# Patient Record
Sex: Female | Born: 1970 | Race: Black or African American | Hispanic: No | Marital: Married | Smoking: Never smoker
Health system: Southern US, Community
[De-identification: ages and names within clinical notes are randomized; demographics above are authoritative.]

## PROBLEM LIST (undated history)

## (undated) DIAGNOSIS — M719 Bursopathy, unspecified: Secondary | ICD-10-CM

## (undated) DIAGNOSIS — I872 Venous insufficiency (chronic) (peripheral): Secondary | ICD-10-CM

## (undated) DIAGNOSIS — Z9889 Other specified postprocedural states: Secondary | ICD-10-CM

## (undated) DIAGNOSIS — D219 Benign neoplasm of connective and other soft tissue, unspecified: Secondary | ICD-10-CM

## (undated) DIAGNOSIS — K219 Gastro-esophageal reflux disease without esophagitis: Secondary | ICD-10-CM

## (undated) DIAGNOSIS — E559 Vitamin D deficiency, unspecified: Secondary | ICD-10-CM

## (undated) DIAGNOSIS — I10 Essential (primary) hypertension: Secondary | ICD-10-CM

## (undated) DIAGNOSIS — R112 Nausea with vomiting, unspecified: Secondary | ICD-10-CM

## (undated) DIAGNOSIS — F419 Anxiety disorder, unspecified: Secondary | ICD-10-CM

## (undated) DIAGNOSIS — R002 Palpitations: Secondary | ICD-10-CM

## (undated) HISTORY — DX: Anxiety disorder, unspecified: F41.9

## (undated) HISTORY — DX: Benign neoplasm of connective and other soft tissue, unspecified: D21.9

## (undated) HISTORY — PX: ABDOMINAL HYSTERECTOMY: SHX81

## (undated) HISTORY — DX: Venous insufficiency (chronic) (peripheral): I87.2

## (undated) HISTORY — PX: DILATION AND CURETTAGE OF UTERUS: SHX78

## (undated) HISTORY — DX: Gastro-esophageal reflux disease without esophagitis: K21.9

## (undated) HISTORY — DX: Vitamin D deficiency, unspecified: E55.9

## (undated) HISTORY — DX: Palpitations: R00.2

---

## 1998-02-13 ENCOUNTER — Encounter: Admission: RE | Admit: 1998-02-13 | Discharge: 1998-05-14 | Payer: Self-pay

## 2001-03-05 ENCOUNTER — Other Ambulatory Visit: Admission: RE | Admit: 2001-03-05 | Discharge: 2001-03-05 | Payer: Self-pay | Admitting: Obstetrics and Gynecology

## 2001-10-08 ENCOUNTER — Inpatient Hospital Stay (HOSPITAL_COMMUNITY): Admission: RE | Admit: 2001-10-08 | Discharge: 2001-10-12 | Payer: Self-pay | Admitting: Obstetrics and Gynecology

## 2002-03-22 ENCOUNTER — Encounter: Payer: Self-pay | Admitting: *Deleted

## 2002-03-22 ENCOUNTER — Emergency Department (HOSPITAL_COMMUNITY): Admission: EM | Admit: 2002-03-22 | Discharge: 2002-03-22 | Payer: Self-pay | Admitting: Internal Medicine

## 2002-09-30 ENCOUNTER — Ambulatory Visit (HOSPITAL_COMMUNITY): Admission: RE | Admit: 2002-09-30 | Discharge: 2002-09-30 | Payer: Self-pay | Admitting: Obstetrics and Gynecology

## 2002-09-30 ENCOUNTER — Encounter: Payer: Self-pay | Admitting: Obstetrics and Gynecology

## 2003-07-30 ENCOUNTER — Ambulatory Visit (HOSPITAL_COMMUNITY): Admission: RE | Admit: 2003-07-30 | Discharge: 2003-07-30 | Payer: Self-pay | Admitting: Family Medicine

## 2005-02-17 ENCOUNTER — Ambulatory Visit: Payer: Self-pay | Admitting: Family Medicine

## 2005-03-07 ENCOUNTER — Ambulatory Visit: Payer: Self-pay | Admitting: Family Medicine

## 2005-05-11 ENCOUNTER — Ambulatory Visit: Payer: Self-pay | Admitting: Family Medicine

## 2005-10-12 ENCOUNTER — Ambulatory Visit: Payer: Self-pay | Admitting: Family Medicine

## 2005-11-25 ENCOUNTER — Ambulatory Visit: Payer: Self-pay | Admitting: Internal Medicine

## 2005-11-30 ENCOUNTER — Ambulatory Visit (HOSPITAL_COMMUNITY): Admission: RE | Admit: 2005-11-30 | Discharge: 2005-11-30 | Payer: Self-pay | Admitting: Internal Medicine

## 2005-11-30 ENCOUNTER — Ambulatory Visit: Payer: Self-pay | Admitting: Internal Medicine

## 2005-12-28 ENCOUNTER — Ambulatory Visit: Payer: Self-pay | Admitting: Family Medicine

## 2006-03-02 ENCOUNTER — Ambulatory Visit: Payer: Self-pay | Admitting: Internal Medicine

## 2006-03-14 ENCOUNTER — Other Ambulatory Visit: Admission: RE | Admit: 2006-03-14 | Discharge: 2006-03-14 | Payer: Self-pay | Admitting: Family Medicine

## 2006-03-14 ENCOUNTER — Ambulatory Visit: Payer: Self-pay | Admitting: Family Medicine

## 2006-03-14 ENCOUNTER — Encounter: Payer: Self-pay | Admitting: Family Medicine

## 2006-03-17 ENCOUNTER — Ambulatory Visit (HOSPITAL_COMMUNITY): Admission: RE | Admit: 2006-03-17 | Discharge: 2006-03-17 | Payer: Self-pay | Admitting: Family Medicine

## 2006-05-26 ENCOUNTER — Ambulatory Visit (HOSPITAL_COMMUNITY): Admission: RE | Admit: 2006-05-26 | Discharge: 2006-05-26 | Payer: Self-pay | Admitting: Obstetrics and Gynecology

## 2006-05-26 ENCOUNTER — Encounter (INDEPENDENT_AMBULATORY_CARE_PROVIDER_SITE_OTHER): Payer: Self-pay | Admitting: Specialist

## 2006-06-01 ENCOUNTER — Ambulatory Visit: Payer: Self-pay | Admitting: Internal Medicine

## 2006-10-23 ENCOUNTER — Ambulatory Visit: Payer: Self-pay | Admitting: Family Medicine

## 2006-11-07 ENCOUNTER — Ambulatory Visit (HOSPITAL_COMMUNITY): Admission: RE | Admit: 2006-11-07 | Discharge: 2006-11-07 | Payer: Self-pay | Admitting: Family Medicine

## 2006-11-07 ENCOUNTER — Ambulatory Visit: Payer: Self-pay | Admitting: Family Medicine

## 2007-01-17 ENCOUNTER — Ambulatory Visit: Payer: Self-pay | Admitting: Family Medicine

## 2007-01-29 ENCOUNTER — Ambulatory Visit: Payer: Self-pay | Admitting: Family Medicine

## 2007-01-30 ENCOUNTER — Encounter: Payer: Self-pay | Admitting: Family Medicine

## 2007-03-19 ENCOUNTER — Other Ambulatory Visit: Admission: RE | Admit: 2007-03-19 | Discharge: 2007-03-19 | Payer: Self-pay | Admitting: Family Medicine

## 2007-03-19 ENCOUNTER — Ambulatory Visit: Payer: Self-pay | Admitting: Family Medicine

## 2007-03-19 LAB — CONVERTED CEMR LAB: Pap Smear: NORMAL

## 2007-03-20 ENCOUNTER — Encounter: Payer: Self-pay | Admitting: Family Medicine

## 2007-03-20 LAB — CONVERTED CEMR LAB
Candida species: NEGATIVE
Chlamydia, DNA Probe: NEGATIVE
GC Probe Amp, Genital: NEGATIVE
Gardnerella vaginalis: NEGATIVE
Trichomonal Vaginitis: NEGATIVE

## 2007-05-23 ENCOUNTER — Ambulatory Visit: Payer: Self-pay | Admitting: Family Medicine

## 2007-05-23 LAB — CONVERTED CEMR LAB
BUN: 13 mg/dL (ref 6–23)
Basophils Absolute: 0 10*3/uL (ref 0.0–0.1)
Basophils Relative: 0 % (ref 0–1)
CO2: 22 meq/L (ref 19–32)
Calcium: 8.1 mg/dL — ABNORMAL LOW (ref 8.4–10.5)
Chloride: 104 meq/L (ref 96–112)
Cholesterol: 157 mg/dL (ref 0–200)
Creatinine, Ser: 0.65 mg/dL (ref 0.40–1.20)
Eosinophils Absolute: 0.1 10*3/uL (ref 0.0–0.7)
Eosinophils Relative: 2 % (ref 0–5)
Glucose, Bld: 74 mg/dL (ref 70–99)
HCT: 42.4 % (ref 36.0–46.0)
HDL: 66 mg/dL (ref >39)
Hemoglobin: 13.7 g/dL (ref 12.0–15.0)
LDL Cholesterol: 75 mg/dL (ref 0–99)
Lymphocytes Relative: 32 % (ref 12–46)
Lymphs Abs: 2 10*3/uL (ref 0.7–3.3)
MCHC: 32.3 g/dL (ref 30.0–36.0)
MCV: 92 fL (ref 78.0–100.0)
Monocytes Absolute: 0.3 10*3/uL (ref 0.2–0.7)
Monocytes Relative: 5 % (ref 3–11)
Neutro Abs: 3.9 10*3/uL (ref 1.7–7.7)
Neutrophils Relative %: 61 % (ref 43–77)
Platelets: 332 10*3/uL (ref 150–400)
Potassium: 4.1 meq/L (ref 3.5–5.3)
RBC: 4.61 M/uL (ref 3.87–5.11)
RDW: 13.3 % (ref 11.5–14.0)
Sodium: 136 meq/L (ref 135–145)
TSH: 0.351 microintl units/mL (ref 0.350–5.50)
Total CHOL/HDL Ratio: 2.4
Triglycerides: 82 mg/dL (ref <150)
VLDL: 16 mg/dL (ref 0–40)
WBC: 6.3 10*3/uL (ref 4.0–10.5)

## 2007-07-13 ENCOUNTER — Encounter: Payer: Self-pay | Admitting: Family Medicine

## 2007-07-13 DIAGNOSIS — K219 Gastro-esophageal reflux disease without esophagitis: Secondary | ICD-10-CM

## 2007-07-13 DIAGNOSIS — J309 Allergic rhinitis, unspecified: Secondary | ICD-10-CM | POA: Insufficient documentation

## 2007-07-13 DIAGNOSIS — E669 Obesity, unspecified: Secondary | ICD-10-CM | POA: Insufficient documentation

## 2007-07-13 DIAGNOSIS — F329 Major depressive disorder, single episode, unspecified: Secondary | ICD-10-CM | POA: Insufficient documentation

## 2007-07-13 DIAGNOSIS — F411 Generalized anxiety disorder: Secondary | ICD-10-CM

## 2007-07-26 ENCOUNTER — Ambulatory Visit: Payer: Self-pay | Admitting: Family Medicine

## 2007-07-27 ENCOUNTER — Encounter: Payer: Self-pay | Admitting: Family Medicine

## 2007-07-27 LAB — CONVERTED CEMR LAB
Candida species: POSITIVE — AB
Chlamydia, DNA Probe: NEGATIVE
GC Probe Amp, Genital: NEGATIVE
Gardnerella vaginalis: NEGATIVE
Trichomonal Vaginitis: NEGATIVE

## 2007-08-30 ENCOUNTER — Ambulatory Visit (HOSPITAL_COMMUNITY): Payer: Self-pay | Admitting: Psychiatry

## 2007-09-03 ENCOUNTER — Ambulatory Visit: Payer: Self-pay | Admitting: Family Medicine

## 2007-09-20 ENCOUNTER — Encounter: Payer: Self-pay | Admitting: Family Medicine

## 2008-01-09 ENCOUNTER — Ambulatory Visit: Payer: Self-pay | Admitting: Family Medicine

## 2008-01-27 ENCOUNTER — Telehealth: Payer: Self-pay | Admitting: Family Medicine

## 2008-01-29 ENCOUNTER — Ambulatory Visit: Payer: Self-pay | Admitting: Family Medicine

## 2008-03-10 ENCOUNTER — Ambulatory Visit: Payer: Self-pay | Admitting: Orthopedic Surgery

## 2008-03-10 DIAGNOSIS — M76829 Posterior tibial tendinitis, unspecified leg: Secondary | ICD-10-CM | POA: Insufficient documentation

## 2008-03-10 DIAGNOSIS — M7150 Other bursitis, not elsewhere classified, unspecified site: Secondary | ICD-10-CM

## 2008-03-18 ENCOUNTER — Other Ambulatory Visit: Admission: RE | Admit: 2008-03-18 | Discharge: 2008-03-18 | Payer: Self-pay | Admitting: Family Medicine

## 2008-03-18 ENCOUNTER — Encounter: Payer: Self-pay | Admitting: Family Medicine

## 2008-03-18 ENCOUNTER — Ambulatory Visit: Payer: Self-pay | Admitting: Family Medicine

## 2008-03-18 LAB — CONVERTED CEMR LAB: Pap Smear: NORMAL

## 2008-03-24 ENCOUNTER — Encounter: Payer: Self-pay | Admitting: Family Medicine

## 2008-03-24 ENCOUNTER — Ambulatory Visit: Payer: Self-pay | Admitting: Family Medicine

## 2008-03-24 DIAGNOSIS — N949 Unspecified condition associated with female genital organs and menstrual cycle: Secondary | ICD-10-CM

## 2008-03-26 ENCOUNTER — Encounter: Payer: Self-pay | Admitting: Family Medicine

## 2008-04-02 ENCOUNTER — Ambulatory Visit (HOSPITAL_COMMUNITY): Admission: RE | Admit: 2008-04-02 | Discharge: 2008-04-02 | Payer: Self-pay | Admitting: Family Medicine

## 2008-04-10 ENCOUNTER — Encounter: Payer: Self-pay | Admitting: Orthopedic Surgery

## 2008-07-24 ENCOUNTER — Telehealth: Payer: Self-pay | Admitting: Family Medicine

## 2008-08-21 ENCOUNTER — Telehealth (INDEPENDENT_AMBULATORY_CARE_PROVIDER_SITE_OTHER): Payer: Self-pay | Admitting: *Deleted

## 2008-10-08 ENCOUNTER — Ambulatory Visit: Payer: Self-pay | Admitting: Family Medicine

## 2008-10-08 DIAGNOSIS — M25559 Pain in unspecified hip: Secondary | ICD-10-CM | POA: Insufficient documentation

## 2008-10-28 ENCOUNTER — Ambulatory Visit (HOSPITAL_COMMUNITY): Admission: RE | Admit: 2008-10-28 | Discharge: 2008-10-28 | Payer: Self-pay | Admitting: Family Medicine

## 2008-10-28 ENCOUNTER — Encounter: Payer: Self-pay | Admitting: Family Medicine

## 2008-10-28 LAB — CONVERTED CEMR LAB
BUN: 14 mg/dL (ref 6–23)
CO2: 21 meq/L (ref 19–32)
Calcium: 8.9 mg/dL (ref 8.4–10.5)
Chloride: 105 meq/L (ref 96–112)
Cholesterol: 153 mg/dL (ref 0–200)
Creatinine, Ser: 0.75 mg/dL (ref 0.40–1.20)
Glucose, Bld: 77 mg/dL (ref 70–99)
HDL: 82 mg/dL (ref >39)
LDL Cholesterol: 51 mg/dL (ref 0–99)
Potassium: 4.1 meq/L (ref 3.5–5.3)
Sodium: 137 meq/L (ref 135–145)
TSH: 0.519 microintl units/mL (ref 0.350–4.50)
Total CHOL/HDL Ratio: 1.9
Triglycerides: 99 mg/dL (ref <150)
VLDL: 20 mg/dL (ref 0–40)

## 2008-11-05 ENCOUNTER — Encounter: Payer: Self-pay | Admitting: Family Medicine

## 2008-11-11 ENCOUNTER — Ambulatory Visit: Payer: Self-pay | Admitting: Family Medicine

## 2008-11-11 DIAGNOSIS — J209 Acute bronchitis, unspecified: Secondary | ICD-10-CM | POA: Insufficient documentation

## 2008-11-21 ENCOUNTER — Encounter: Payer: Self-pay | Admitting: Family Medicine

## 2008-11-27 ENCOUNTER — Encounter: Payer: Self-pay | Admitting: Family Medicine

## 2008-12-18 ENCOUNTER — Telehealth: Payer: Self-pay | Admitting: Family Medicine

## 2008-12-24 ENCOUNTER — Ambulatory Visit: Payer: Self-pay | Admitting: Family Medicine

## 2008-12-24 DIAGNOSIS — R002 Palpitations: Secondary | ICD-10-CM

## 2008-12-24 DIAGNOSIS — R5381 Other malaise: Secondary | ICD-10-CM

## 2008-12-24 DIAGNOSIS — R079 Chest pain, unspecified: Secondary | ICD-10-CM | POA: Insufficient documentation

## 2008-12-24 DIAGNOSIS — R5383 Other fatigue: Secondary | ICD-10-CM

## 2008-12-26 ENCOUNTER — Encounter: Payer: Self-pay | Admitting: Family Medicine

## 2009-01-11 HISTORY — PX: TRANSTHORACIC ECHOCARDIOGRAM: SHX275

## 2009-01-13 ENCOUNTER — Encounter: Payer: Self-pay | Admitting: Family Medicine

## 2009-01-14 ENCOUNTER — Encounter: Payer: Self-pay | Admitting: Family Medicine

## 2009-01-20 ENCOUNTER — Ambulatory Visit: Payer: Self-pay | Admitting: Family Medicine

## 2009-01-23 DIAGNOSIS — J019 Acute sinusitis, unspecified: Secondary | ICD-10-CM | POA: Insufficient documentation

## 2009-02-18 ENCOUNTER — Encounter: Payer: Self-pay | Admitting: Family Medicine

## 2009-03-04 IMAGING — US US PELVIS COMPLETE MODIFY
1 series · 14 of 25 positions shown · non-contrast
Comparison: 03/17/2006

CLINICAL DATA: Dysfunctional uterine bleeding.

TRANSABDOMINAL AND TRANSVAGINAL ULTRASOUND OF PELVIS
TECHNIQUE: Both transabdominal and transvaginal ultrasound
examinations of the pelvis were performed including evaluation of
the uterus, ovaries, adnexal regions, and pelvic cul-de-sac.

[Series 1: us pelvis complete modify · 0.24mm/px · 14 of 68 slices shown]
[im 1/68]
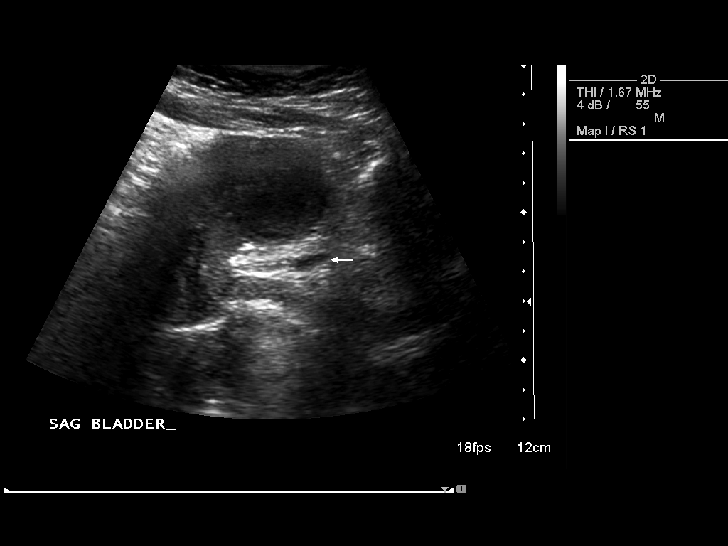
[im 6/68]
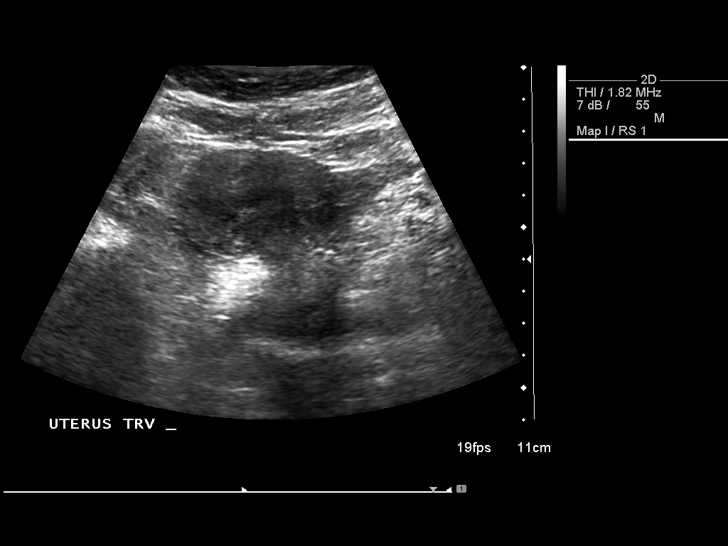
[im 12/68]
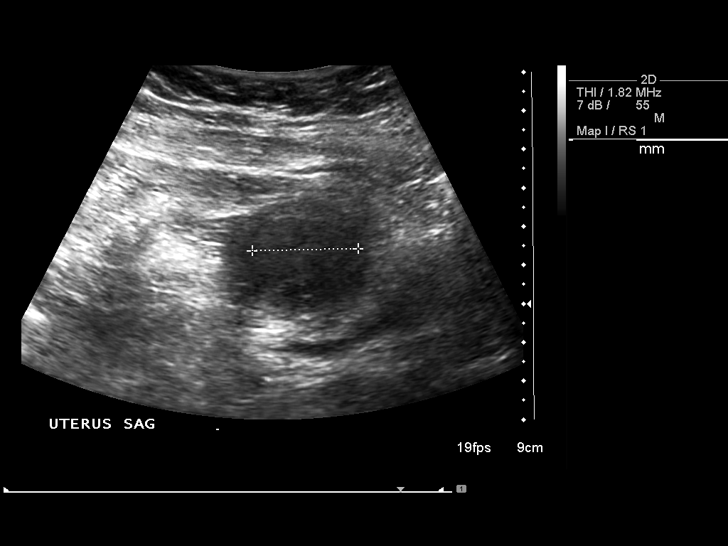
[im 17/68]
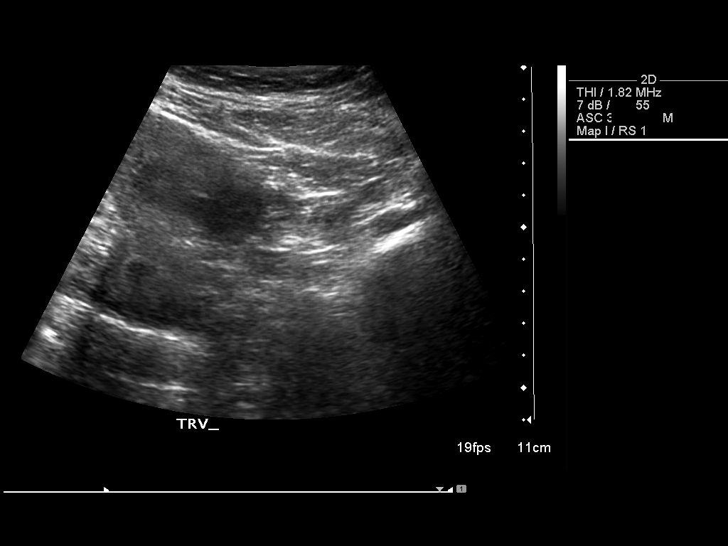
[im 23/68]
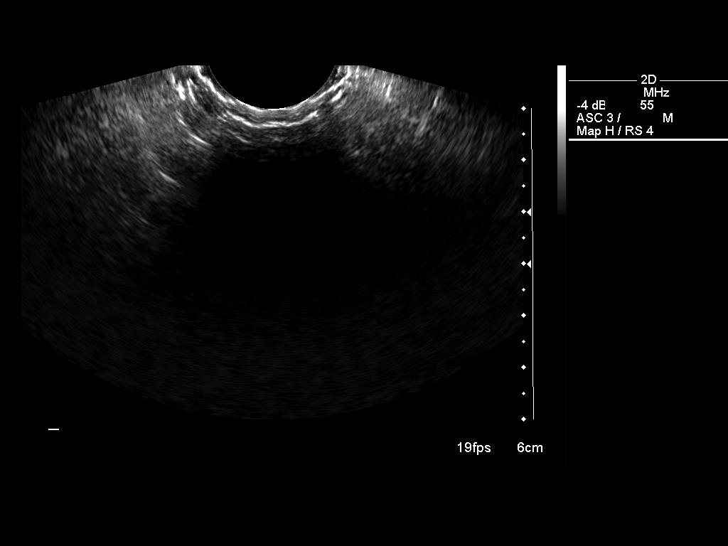
[im 26/68]
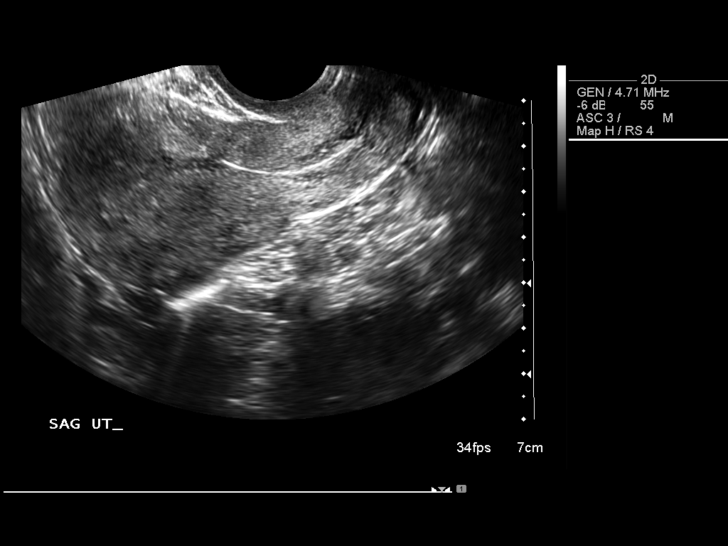
[im 31/68]
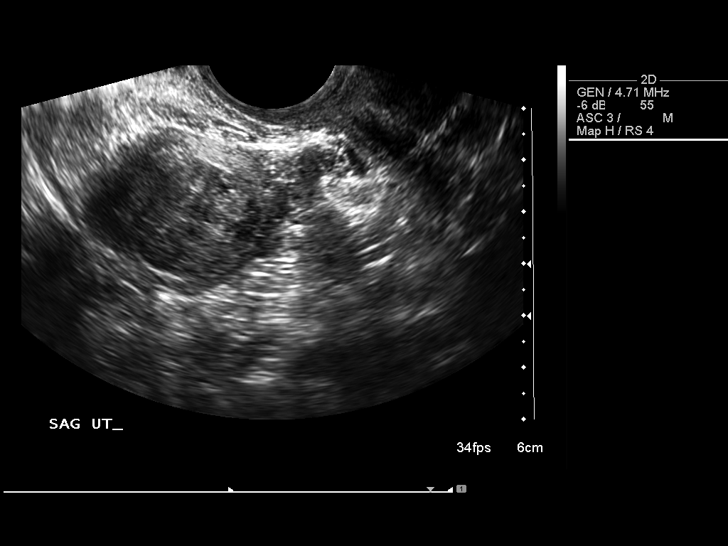
[im 37/68]
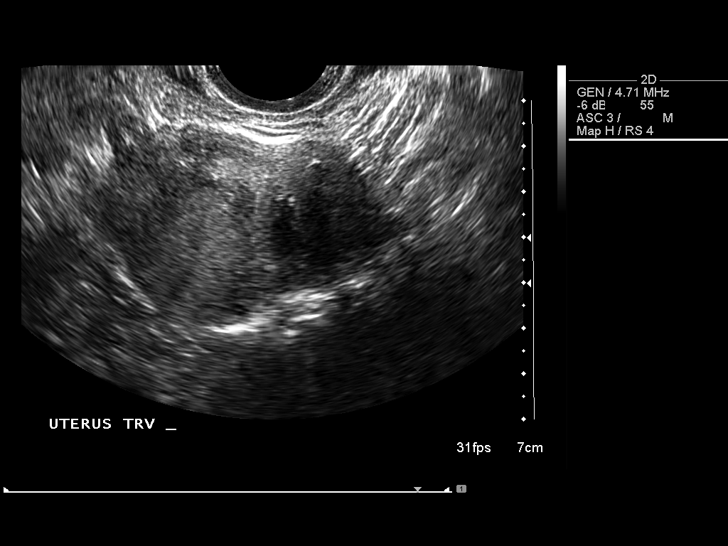
[im 42/68]
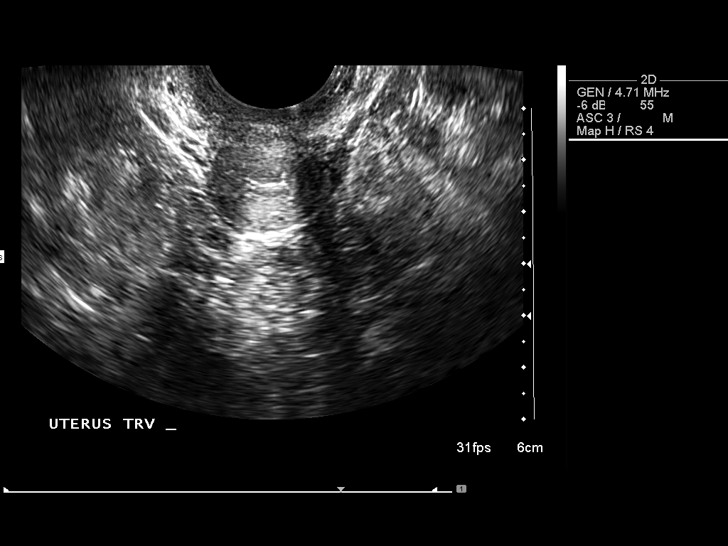
[im 45/68]
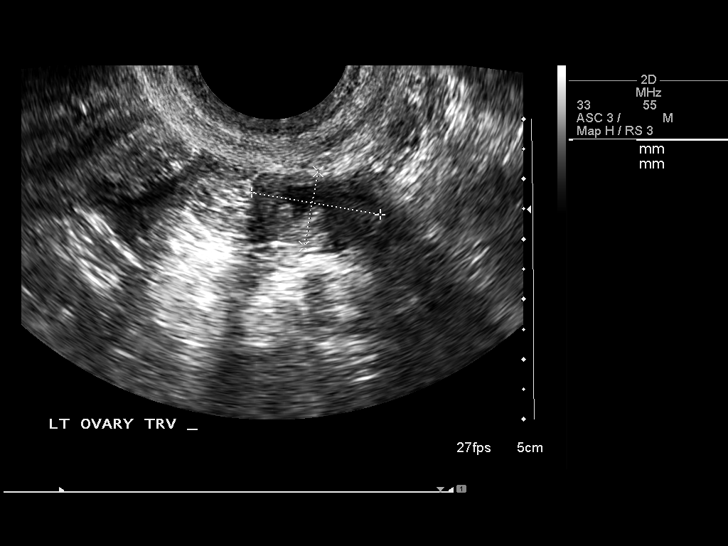
[im 51/68]
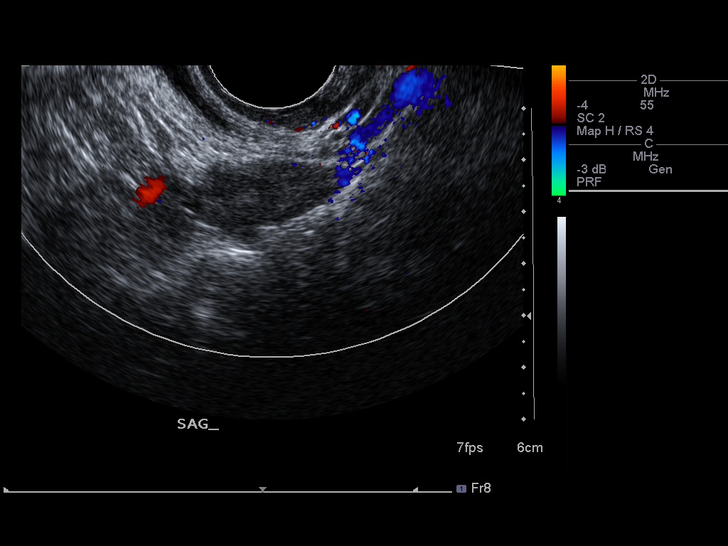
[im 56/68]
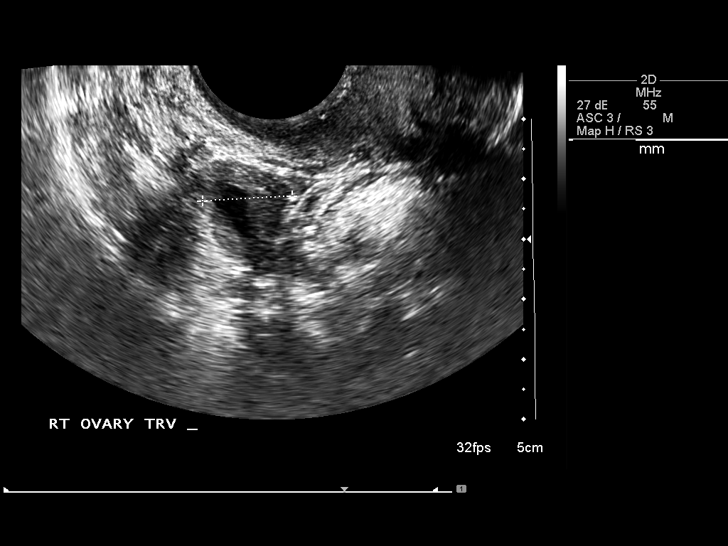
[im 62/68]
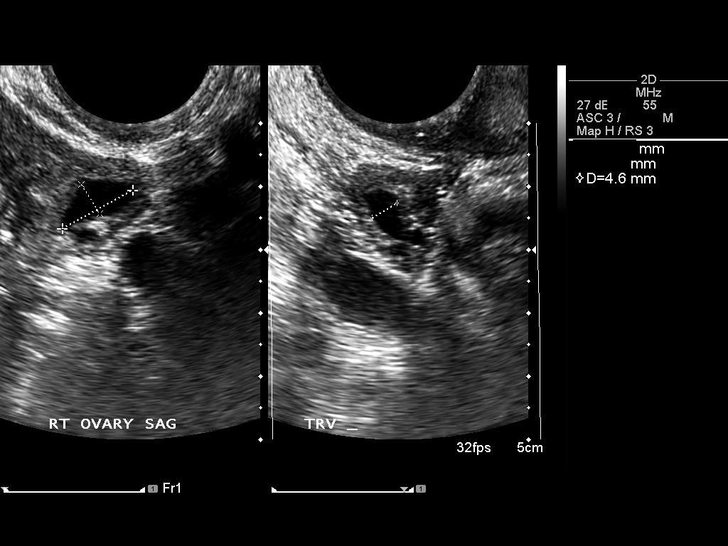
[im 68/68]
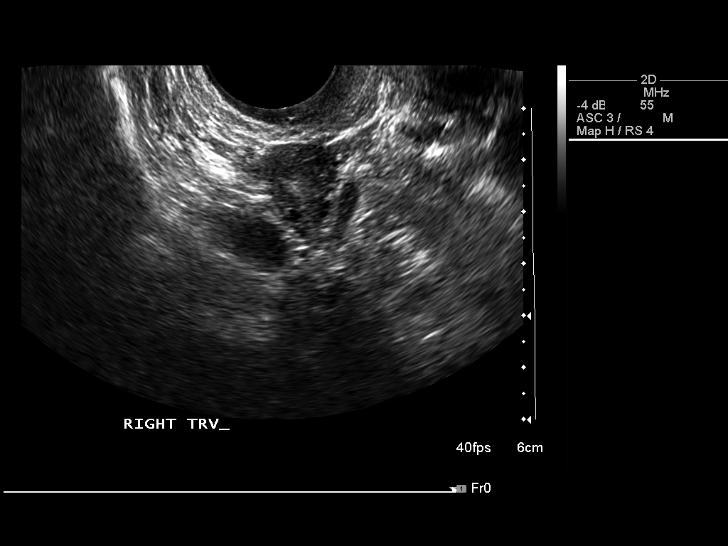

[14 of 25 positions shown; findings below may reference images not displayed]

FINDINGS: Uterus measures 8.4 x 4.5 x 5.9 cm and contains a 2.6 x
2.1 x 2.7 cm hypoechoic lesion in the fundus.  Previously seen
submucosal fibroid is not well visualized today.  Endometrial
stripe measures 1.1 cm.  Ovaries are unremarkable with follicles
bilaterally.  No free fluid.
IMPRESSION: 1.  Uterine fundal fibroid.  Submucosal fibroid seen on the prior
study is not well visualized today.

## 2009-07-20 ENCOUNTER — Ambulatory Visit: Payer: Self-pay | Admitting: Family Medicine

## 2009-07-29 ENCOUNTER — Encounter: Payer: Self-pay | Admitting: Family Medicine

## 2009-09-28 ENCOUNTER — Encounter (INDEPENDENT_AMBULATORY_CARE_PROVIDER_SITE_OTHER): Payer: Self-pay

## 2009-09-28 ENCOUNTER — Ambulatory Visit: Payer: Self-pay | Admitting: Family Medicine

## 2009-11-23 ENCOUNTER — Ambulatory Visit: Payer: Self-pay | Admitting: Family Medicine

## 2010-01-26 ENCOUNTER — Ambulatory Visit: Payer: Self-pay | Admitting: Family Medicine

## 2010-04-08 ENCOUNTER — Ambulatory Visit: Payer: Self-pay | Admitting: Family Medicine

## 2010-04-08 DIAGNOSIS — B369 Superficial mycosis, unspecified: Secondary | ICD-10-CM | POA: Insufficient documentation

## 2010-06-22 ENCOUNTER — Encounter: Admission: RE | Admit: 2010-06-22 | Discharge: 2010-06-22 | Payer: Self-pay | Admitting: Family Medicine

## 2010-07-13 ENCOUNTER — Encounter: Payer: Self-pay | Admitting: Family Medicine

## 2010-07-14 ENCOUNTER — Encounter: Payer: Self-pay | Admitting: Family Medicine

## 2010-07-14 LAB — CONVERTED CEMR LAB
ALT: <8 units/L (ref 0–35)
AST: 15 units/L (ref 0–37)
Albumin: 3.8 g/dL (ref 3.5–5.2)
Alkaline Phosphatase: 56 units/L (ref 39–117)
BUN: 12 mg/dL (ref 6–23)
CO2: 24 meq/L (ref 19–32)
Calcium: 9.1 mg/dL (ref 8.4–10.5)
Chloride: 106 meq/L (ref 96–112)
Cholesterol: 146 mg/dL (ref 0–200)
Creatinine, Ser: 0.82 mg/dL (ref 0.40–1.20)
Glucose, Bld: 84 mg/dL (ref 70–99)
HCT: 42.5 % (ref 36.0–46.0)
HDL: 60 mg/dL (ref >39)
Hemoglobin: 13.7 g/dL (ref 12.0–15.0)
Hgb A1c MFr Bld: 5.6 % (ref <5.7)
LDL Cholesterol: 73 mg/dL (ref 0–99)
MCHC: 32.2 g/dL (ref 30.0–36.0)
MCV: 89.7 fL (ref 78.0–100.0)
Platelets: 365 10*3/uL (ref 150–400)
Potassium: 4.7 meq/L (ref 3.5–5.3)
RBC: 4.74 M/uL (ref 3.87–5.11)
RDW: 13.4 % (ref 11.5–15.5)
Sodium: 140 meq/L (ref 135–145)
TSH: 0.514 microintl units/mL (ref 0.350–4.500)
Total Bilirubin: 0.4 mg/dL (ref 0.3–1.2)
Total CHOL/HDL Ratio: 2.4
Total Protein: 6.8 g/dL (ref 6.0–8.3)
Triglycerides: 67 mg/dL (ref <150)
VLDL: 13 mg/dL (ref 0–40)
Vit D, 25-Hydroxy: 17 ng/mL — ABNORMAL LOW (ref 30–89)
WBC: 6.8 10*3/uL (ref 4.0–10.5)

## 2010-07-15 ENCOUNTER — Ambulatory Visit: Payer: Self-pay | Admitting: Family Medicine

## 2010-08-10 ENCOUNTER — Telehealth (INDEPENDENT_AMBULATORY_CARE_PROVIDER_SITE_OTHER): Payer: Self-pay | Admitting: *Deleted

## 2010-08-11 ENCOUNTER — Telehealth (INDEPENDENT_AMBULATORY_CARE_PROVIDER_SITE_OTHER): Payer: Self-pay | Admitting: *Deleted

## 2010-08-16 ENCOUNTER — Ambulatory Visit: Payer: Self-pay | Admitting: Family Medicine

## 2010-08-16 ENCOUNTER — Encounter: Payer: Self-pay | Admitting: Family Medicine

## 2010-10-10 ENCOUNTER — Encounter: Payer: Self-pay | Admitting: Family Medicine

## 2010-10-17 LAB — CONVERTED CEMR LAB
HCT: 39.2 % (ref 36.0–46.0)
Hemoglobin: 12.5 g/dL (ref 12.0–15.0)
MCHC: 31.9 g/dL (ref 30.0–36.0)
MCV: 90.5 fL (ref 78.0–100.0)
Platelets: 328 10*3/uL (ref 150–400)
RBC: 4.33 M/uL (ref 3.87–5.11)
RDW: 13.5 % (ref 11.5–15.5)
WBC: 6.8 10*3/uL (ref 4.0–10.5)

## 2010-10-19 ENCOUNTER — Ambulatory Visit: Admit: 2010-10-19 | Payer: Self-pay | Admitting: Family Medicine

## 2010-10-19 NOTE — Letter (Signed)
Summary: xray  xray   Imported By: Curtis Sites 02/16/2010 14:36:17  _____________________________________________________________________  External Attachment:    Type:   Image     Comment:   External Document

## 2010-10-19 NOTE — Letter (Signed)
Summary: progress notes  progress notes   Imported By: Curtis Sites 02/16/2010 14:41:28  _____________________________________________________________________  External Attachment:    Type:   Image     Comment:   External Document

## 2010-10-19 NOTE — Assessment & Plan Note (Signed)
Summary: labs / diet pills   Vital Signs:  Patient profile:   40 year old female Menstrual status:  irregular Height:      57 inches Weight:      199.25 pounds BMI:     43.27 O2 Sat:      95 % on Room air Pulse rate:   87 / minute Pulse rhythm:   regular Resp:     16 per minute BP sitting:   134 / 90  (left arm)  Vitals Entered By: Mauricia Area CMA (July 15, 2010 3:03 PM)  Nutrition Counseling: Patient's BMI is greater than 25 and therefore counseled on weight management options.  O2 Flow:  Room air CC: follow up   CC:  follow up.  History of Present Illness: Reports  that she has not been doing well as far as weight is concerned, she is otherwise ok Denies recent fever or chills. Denies sinus pressure, nasal congestion , ear pain or sore throat. Denies chest congestion, or cough productive of sputum. Denies chest pain, palpitations, PND, orthopnea or leg swelling. Denies abdominal pain, nausea, vomitting, diarrhea or constipation. Denies change in bowel movements or bloody stool. Denies dysuria , frequency, incontinence or hesitancy. Denies  joint pain, swelling, or reduced mobility. Denies headaches, vertigo, seizures. Denies depression, anxiety or insomnia. Denies  rash, lesions, or itch.     Current Medications (verified): 1)  Ortho Tri-Cyclen Lo 0.025 Mg  Tabs (Norgestimate-Ethinyl Estradiol) .... Take 1 Tablet By Mouth Once A Day 2)  Metoprolol Tartrate 25 Mg Tabs (Metoprolol Tartrate) .... Take 1 Tablet By Mouth Two Times A Day 3)  Phentermine Hcl 37.5 Mg Caps (Phentermine Hcl) .... Take 1 Capsule By Mouth Once A Day 4)  Clotrimazole-Betamethasone 1-0.05 % Crea (Clotrimazole-Betamethasone) .... Apply Twice Daily As Needed 5)  Fluconazole 150 Mg Tabs (Fluconazole) .... Take 1 Tablet By Mouth Once A Day 6)  Nystatin 100000 Unit/gm Powd (Nystatin) .... Use Twice Daily As Needed 7)  Vitamin D (Ergocalciferol) 50000 Unit Caps (Ergocalciferol) .... One Capsule  Once Weekly 8)  Prevacid 30 Mg .... I Cap Daily 9)  Bayer 500 Mg .... 2 Tab As Needed 10)  Aleve .Marland Kitchen.. 2 Tab As Needed  Allergies (verified): No Known Drug Allergies  Review of Systems      See HPI General:  Complains of fatigue. Eyes:  Denies discharge, eye pain, and red eye. Heme:  Denies abnormal bruising and bleeding. Allergy:  Complains of seasonal allergies; denies hives or rash and itching eyes.  Physical Exam  General:  Well-developed,obese iin no acute distress; alert,appropriate and cooperative throughout examination HEENT: No facial asymmetry,  EOMI, No sinus tenderness, TM's Clear, oropharynx  pink and moist.   Chest: Clear to auscultation bilaterally.  CVS: S1, S2, No murmurs, No S3.   Abd: Soft, Nontender.  MS: Adequate ROM spine, hips, shoulders and knees.  Ext: No edema.   CNS: CN 2-12 intact, power tone and sensation normal throughout.   Skin: Intact, no visible lesions or rashes.  Psych: Good eye contact, normal affect.  Memory intact, not anxious or depressed appearing.    Impression & Recommendations:  Problem # 1:  OBESITY NOS (ICD-278.00) Assessment Deteriorated  Ht: 57 (07/15/2010)   Wt: 199.25 (07/15/2010)   BMI: 43.27 (07/15/2010) therapeutic lifestyle change discussed and encouraged Pt to resume phentermine  Problem # 2:  GERD (ICD-530.81) Assessment: Deteriorated  The following medications were removed from the medication list:    Omeprazole 40 Mg Cpdr (Omeprazole) .Marland KitchenMarland KitchenMarland KitchenMarland Kitchen  Take 1 tablet by mouth once a day  Problem # 3:  PALPITATIONS (ICD-785.1) Assessment: Improved  Her updated medication list for this problem includes:    Metoprolol Tartrate 25 Mg Tabs (Metoprolol tartrate) .Marland Kitchen... Take 1 tablet by mouth two times a day  Problem # 4:  ANXIETY DISORDER, GENERALIZED (ICD-300.02) Assessment: Unchanged  Her updated medication list for this problem includes:    Alprazolam 0.5 Mg Tabs (Alprazolam) ..... One tab by mouth once daily  Complete  Medication List: 1)  Ortho Tri-cyclen Lo 0.025 Mg Tabs (Norgestimate-ethinyl estradiol) .... Take 1 tablet by mouth once a day 2)  Metoprolol Tartrate 25 Mg Tabs (Metoprolol tartrate) .... Take 1 tablet by mouth two times a day 3)  Clotrimazole-betamethasone 1-0.05 % Crea (Clotrimazole-betamethasone) .... Apply twice daily as needed 4)  Fluconazole 150 Mg Tabs (Fluconazole) .... Take 1 tablet by mouth once a day 5)  Nystatin 100000 Unit/gm Powd (Nystatin) .... Use twice daily as needed 6)  Vitamin D (ergocalciferol) 50000 Unit Caps (Ergocalciferol) .... One capsule once weekly 7)  Prevacid 30 Mg  .... I cap daily 8)  Bayer 500 Mg  .... 2 tab as needed 9)  Aleve  .Marland Kitchen.. 2 tab as needed 10)  Phentermine Hcl 37.5 Mg Tabs (Phentermine hcl) .... Take 1 tablet by mouth once a day 11)  Alprazolam 0.5 Mg Tabs (Alprazolam) .... One tab by mouth once daily  Patient Instructions: 1)  Please schedule a follow-up appointment in 1 month. 2)  It is important that you exercise regularly at least30 minutes 5 times a week. If you develop chest pain, have severe difficulty breathing, or feel very tired , stop exercising immediately and seek medical attention. 3)  You need to lose weight. Consider a lower calorie diet and regular exercise. Goal is 4 to 6 pounds. Prescriptions: PHENTERMINE HCL 37.5 MG TABS (PHENTERMINE HCL) Take 1 tablet by mouth once a day  #30 x 0   Entered and Authorized by:   Syliva Overman MD   Signed by:   Syliva Overman MD on 07/15/2010   Method used:   Printed then faxed to ...       [facility] [address] [phone]* (retail)       [address] [place], [place]  [address]       Ph: [phone]       Fax: [phone]   RxID:   [id number]    Orders Added: 1)  Est. Patient Level IV [44010]

## 2010-10-19 NOTE — Letter (Signed)
Summary: history and physical  history and physical   Imported By: Curtis Sites 02/16/2010 14:42:16  _____________________________________________________________________  External Attachment:    Type:   Image     Comment:   External Document

## 2010-10-19 NOTE — Assessment & Plan Note (Signed)
Summary: follow up, weight loss- room 1   Vital Signs:  Patient profile:   40 year old female Menstrual status:  irregular Height:      57 inches Weight:      189.50 pounds BMI:     41.16 O2 Sat:      99 % on Room air Pulse rate:   88 / minute Resp:     16 per minute BP sitting:   104 / 72  (left arm)  Vitals Entered By: Adella Hare LPN (Jan 26, 2010 1:47 PM) CC: follow up - weight loss Is Patient Diabetic? No Pain Assessment Patient in pain? no        CC:  follow up - weight loss.  History of Present Illness: Pt is here today requesting a prescription of phentermine.  She has taken this in the past & helped her with weight loss.  She states she recently resumed walking 20 mins a day 5 days a week.  Has also been trying to eat healthier.  she has a hx of palpitations.  Currently controlled on metoprolol.  States the cardiologist prev advised her she could take the phentermine as long as it did not increase her palpitations.  also has hx of gerd.  Uses Prevacid over the counter.  Has tried other PPI's but Prevacid works the best.  If she misses taking it then has heartburn syptoms, otherwise is well controlled.   Current Medications (verified): 1)  Ortho Tri-Cyclen Lo 0.025 Mg  Tabs (Norgestimate-Ethinyl Estradiol) .... Take 1 Tablet By Mouth Once A Day 2)  Omeprazole 40 Mg Cpdr (Omeprazole) .... Take 1 Tablet By Mouth Once A Day 3)  Metoprolol Tartrate 25 Mg Tabs (Metoprolol Tartrate) .... Take 1 Tablet By Mouth Two Times A Day 4)  Phentermine Hcl 37.5 Mg Caps (Phentermine Hcl) .... Take 1 Capsule By Mouth Once A Day  Allergies (verified): No Known Drug Allergies  Past History:  Past medical history reviewed for relevance to current acute and chronic problems.  Past Medical History: Reviewed history from 07/13/2007 and no changes required. Current Problems:  ANXIETY DISORDER, GENERALIZED (ICD-300.02) DEPRESSION (ICD-311) GERD (ICD-530.81) ALLERGIC RHINITIS,  SEASONAL (ICD-477.0) OBESITY NOS (ICD-278.00)  Review of Systems General:  Denies chills, fever, and loss of appetite. CV:  Denies chest pain or discomfort, palpitations, and shortness of breath with exertion. Resp:  Denies shortness of breath. GI:  Denies change in bowel habits, nausea, and vomiting.  Physical Exam  General:  Well-developed,well-nourished,in no acute distress; alert,appropriate and cooperative throughout examination Head:  Normocephalic and atraumatic without obvious abnormalities. No apparent alopecia or balding. Ears:  External ear exam shows no significant lesions or deformities.  Otoscopic examination reveals clear canals, tympanic membranes are intact bilaterally without bulging, retraction, inflammation or discharge. Hearing is grossly normal bilaterally. Nose:  External nasal examination shows no deformity or inflammation. Nasal mucosa are pink and moist without lesions or exudates. Mouth:  Oral mucosa and oropharynx without lesions or exudates.  Teeth in good repair. Neck:  No deformities, masses, or tenderness noted. Lungs:  Normal respiratory effort, chest expands symmetrically. Lungs are clear to auscultation, no crackles or wheezes. Heart:  Normal rate and regular rhythm. S1 and S2 normal without gallop, murmur, click, rub or other extra sounds. Abdomen:  Bowel sounds positive,abdomen soft and non-tender without masses, organomegaly or hernias noted. Cervical Nodes:  No lymphadenopathy noted Psych:  Cognition and judgment appear intact. Alert and cooperative with normal attention span and concentration. No apparent delusions,  illusions, hallucinations   Impression & Recommendations:  Problem # 1:  OBESITY NOS (ICD-278.00) Assessment Improved  Ht: 57 (01/26/2010)   Wt: 189.50 (01/26/2010)   BMI: 41.16 (01/26/2010)  Orders: T-CMP with estimated GFR ([address]) T- Hemoglobin A1C (57846-96295)  Problem # 2:  PALPITATIONS (ICD-785.1) Assessment:  Improved  Her updated medication list for this problem includes:    Metoprolol Tartrate 25 Mg Tabs (Metoprolol tartrate) .Marland Kitchen... Take 1 tablet by mouth two times a day  Problem # 3:  GERD (ICD-530.81) Assessment: Improved  Her updated medication list for this problem includes:    Omeprazole 40 Mg Cpdr (Omeprazole) .Marland Kitchen... Take 1 tablet by mouth once a day  Orders: T-CMP with estimated GFR ([address])  Complete Medication List: 1)  Ortho Tri-cyclen Lo 0.025 Mg Tabs (Norgestimate-ethinyl estradiol) .... Take 1 tablet by mouth once a day 2)  Omeprazole 40 Mg Cpdr (Omeprazole) .... Take 1 tablet by mouth once a day 3)  Metoprolol Tartrate 25 Mg Tabs (Metoprolol tartrate) .... Take 1 tablet by mouth two times a day 4)  Phentermine Hcl 37.5 Mg Caps (Phentermine hcl) .... Take 1 capsule by mouth once a day  Other Orders: T-Lipid Profile (10272-53664) T-CBC No Diff (40347-42595) T-TSH [phone]) T-Vitamin D (25-Hydroxy) (95188-41660)  Patient Instructions: 1)  Please schedule a follow-up appointment in 2 months. 2)  It is important that you exercise regularly at least 20 minutes 5 times a week. If you develop chest pain, have severe difficulty breathing, or feel very tired , stop exercising immediately and seek medical attention. 3)  You need to lose weight. Consider a lower calorie diet and regular exercise.  Prescriptions: PHENTERMINE HCL 37.5 MG CAPS (PHENTERMINE HCL) Take 1 capsule by mouth once a day  #30 x 1   Entered and Authorized by:   Esperanza Sheets PA   Signed by:   Esperanza Sheets PA on 01/26/2010   Method used:   Printed then faxed to ...       [facility]  [address] (retail)       [address]       [place], [place]  [address]       Ph: [phone]       Fax: [phone]   RxID:   [id number]

## 2010-10-19 NOTE — Letter (Signed)
Summary: misc  misc   Imported By: Curtis Sites 02/16/2010 14:35:00  _____________________________________________________________________  External Attachment:    Type:   Image     Comment:   External Document

## 2010-10-19 NOTE — Letter (Signed)
Summary: phone notes  phone notes   Imported By: Curtis Sites 02/16/2010 14:35:53  _____________________________________________________________________  External Attachment:    Type:   Image     Comment:   External Document

## 2010-10-19 NOTE — Assessment & Plan Note (Signed)
Summary: b/p check--slj  Nurse Visit   Allergies: No Known Drug Allergies convered to md visit

## 2010-10-19 NOTE — Progress Notes (Signed)
Summary: came off metoprolol  Phone Note Call from Patient   Summary of Call: When patient was called about the blood pressure check next week she stopped her metoprolol about a month ago, she states that she went to new heart doctor at [facility] he told her he wanted her to come off of it and see what happened.   Initial call taken by: Curtis Sites,  August 11, 2010 8:08 AM  Follow-up for Phone Call        noted, no new advice, pls let her know Follow-up by: Syliva Overman MD,  August 11, 2010 12:54 PM  Additional Follow-up for Phone Call Additional follow up Details #1::        left msg for patient, stating that you noted that she had stopped medication and that we would see her next week for her b/p check. Additional Follow-up by: Curtis Sites,  August 11, 2010 4:12 PM

## 2010-10-19 NOTE — Letter (Signed)
Summary: demographic  demographic   Imported By: Curtis Sites 02/16/2010 14:43:22  _____________________________________________________________________  External Attachment:    Type:   Image     Comment:   External Document

## 2010-10-19 NOTE — Assessment & Plan Note (Signed)
Summary: office visit   Vital Signs:  Patient profile:   40 year old female Menstrual status:  irregular Height:      57 inches O2 Sat:      98 % Pulse rate:   80 / minute Pulse rhythm:   regular Resp:     16 per minute BP sitting:   120 / 88  (left arm) Cuff size:   large  Vitals Entered By: Everitt Amber LPN (April 08, 2010 9:42 AM) CC: has broken out under her breasts and cheeks of her behind. Very itchy. Started first part of July   CC:  has broken out under her breasts and cheeks of her behind. Very itchy. Started first part of July.  History of Present Illness: 2 week h/o puritic rash under her breasts also of her buttock, has had similar in the past, no response to triple antibiotic.  allergies are doing well with her current meds Reports  that she has otherwise been doing well. Denies recent fever or chills. Denies sinus pressure, nasal congestion , ear pain or sore throat. Denies chest congestion, or cough productive of sputum. Denies chest pain, palpitations, PND, orthopnea or leg swelling. Denies abdominal pain, nausea, vomitting, diarrhea or constipation. Denies change in bowel movements or bloody stool. Denies dysuria , frequency, incontinence or hesitancy. Denies  joint pain, swelling, or reduced mobility. Denies headaches, vertigo, seizures.    Allergies (verified): No Known Drug Allergies  Review of Systems      See HPI General:  Complains of fatigue. Eyes:  Denies blurring, discharge, eye pain, and red eye. Derm:  Complains of itching and rash. Endo:  Denies cold intolerance, excessive hunger, and heat intolerance. Heme:  Denies abnormal bruising and bleeding. Allergy:  Complains of seasonal allergies; denies hives or rash and itching eyes.  Physical Exam  General:  Well-developed,obese,in no acute distress; alert,appropriate and cooperative throughout examination HEENT: No facial asymmetry,  EOMI, No sinus tenderness, TM's Clear, oropharynx  pink  and moist.   Chest: Clear to auscultation bilaterally.  CVS: S1, S2, No murmurs, No S3.   Abd: Soft, Nontender.  MS: Adequate ROM spine, hips, shoulders and knees.  Ext: No edema.   CNS: CN 2-12 intact, power tone and sensation normal throughout.   Skin: Intact, fungal  rash under both breasts, and perianally  Psych: Good eye contact, normal affect.  Memory intact, not anxious or depressed appearing.     Impression & Recommendations:  Problem # 1:  DERMATOMYCOSIS (ICD-111.9) Assessment Comment Only  Her updated medication list for this problem includes:    Clotrimazole-betamethasone 1-0.05 % Crea (Clotrimazole-betamethasone) .Marland Kitchen... Apply twice daily as needed    Fluconazole 150 Mg Tabs (Fluconazole) .Marland Kitchen... Take 1 tablet by mouth once a day    Nystatin 100000 Unit/gm Powd (Nystatin) ..... Use twice daily as needed  Problem # 2:  ANXIETY DISORDER, GENERALIZED (ICD-300.02) Assessment: Improved pt has significant issues with childhood problems, which she states she ahs resolved to a grt extent and is still working on, she refuses referral to counselling  Problem # 3:  GERD (ICD-530.81) Assessment: Unchanged  Her updated medication list for this problem includes:    Omeprazole 40 Mg Cpdr (Omeprazole) .Marland Kitchen... Take 1 tablet by mouth once a day  Problem # 4:  OBESITY NOS (ICD-278.00) Assessment: Comment Only  Ht: 57 (04/08/2010)   Wt: 189.50 (01/26/2010)   BMI: 41.16 (01/26/2010) pt refused to be weighed at this visit  Complete Medication List: 1)  Ortho Tri-cyclen  Lo 0.025 Mg Tabs (Norgestimate-ethinyl estradiol) .... Take 1 tablet by mouth once a day 2)  Omeprazole 40 Mg Cpdr (Omeprazole) .... Take 1 tablet by mouth once a day 3)  Metoprolol Tartrate 25 Mg Tabs (Metoprolol tartrate) .... Take 1 tablet by mouth two times a day 4)  Phentermine Hcl 37.5 Mg Caps (Phentermine hcl) .... Take 1 capsule by mouth once a day 5)  Clotrimazole-betamethasone 1-0.05 % Crea  (Clotrimazole-betamethasone) .... Apply twice daily as needed 6)  Fluconazole 150 Mg Tabs (Fluconazole) .... Take 1 tablet by mouth once a day 7)  Nystatin 100000 Unit/gm Powd (Nystatin) .... Use twice daily as needed  Patient Instructions: 1)  f/u as before 2)  It is important that you exercise regularly at least 20 minutes 5 times a week. If you develop chest pain, have severe difficulty breathing, or feel very tired , stop exercising immediately and seek medical attention. 3)  You need to lose weight. Consider a lower calorie diet and regular exercise.  4)  meds are sent in. 5)  labs as soon as possible Prescriptions: NYSTATIN 100000 UNIT/GM POWD (NYSTATIN) use twice daily as needed  #45 gm x 2   Entered and Authorized by:   Syliva Overman MD   Signed by:   Syliva Overman MD on 04/08/2010   Method used:   Electronically to        [facility] [address] [phone]* (retail)       [address], [place]  [address]       Ph: [phone]       Fax: [phone]   RxID:   [id number] FLUCONAZOLE 150 MG TABS (FLUCONAZOLE) Take 1 tablet by mouth once a day  #3 x 0   Entered and Authorized by:   Syliva Overman MD   Signed by:   Syliva Overman MD on 04/08/2010   Method used:   Electronically to        [facility] [address] [phone]* (retail)       [address] [place], [place]  [address]       Ph: [phone]       Fax: [phone]   RxID:   [id number] CLOTRIMAZOLE-BETAMETHASONE 1-0.05 % CREA (CLOTRIMAZOLE-BETAMETHASONE) apply twice daily as needed  #45 gm x 1   Entered and Authorized by:   Syliva Overman MD   Signed by:   Syliva Overman MD on 04/08/2010   Method used:   Electronically to        [facility] [address] [phone]* (retail)       [address], [place]  [address]       Ph: [phone]       Fax: [phone]   RxID:   [id number]

## 2010-10-19 NOTE — Progress Notes (Signed)
Summary: please advise  Phone Note Call from Patient   Summary of Call: Dr Cousin's office wanted patient to call our office and let Dr. Lodema Hong know that they took her b/p and it was 160/100.  Patient stated Dr. Purnell Shoemaker office didn't say this was a bad thing, but wanted to make sure you knew.  Her call back number is [phone]. Initial call taken by: Curtis Sites,  August 10, 2010 3:20 PM  Follow-up for Phone Call         also needs to stop the phentermine at this time till her BOp is rechecked herepls sched nurse visit next week  for BP check, and let pt know if shedevelops , headache, blurry vision, weakkness , numbness, go to the Ed. she also needs to stop her phentermine, her diet pill, until her bP is rechecked in the office . I left her a msg to call back, I tried to spk with her Follow-up by: Syliva Overman MD,  August 11, 2010 7:08 AM  Additional Follow-up for Phone Call Additional follow up Details #1::        Advised patient of the above she said okay and I made her an appt. for next week. Additional Follow-up by: Curtis Sites,  August 11, 2010 8:05 AM

## 2010-10-19 NOTE — Assessment & Plan Note (Signed)
Summary: OV   Vital Signs:  Patient profile:   40 year old female Menstrual status:  irregular Weight:      195 pounds Pulse rate:   70 / minute Pulse rhythm:   irregular BP sitting:   130 / 90  (right arm)  History of Present Illness: Pt i  for re-eval of BP. her gynae recently told her thi was high, so shhe discontinued the phentermine and is here for re-eval. She had been on toprol which was recently stopped by her card due to /e and the feeling that it was not needed. Reports  that she has otherwise been doing well. Denies recent fever or chills. Denies sinus pressure, nasal congestion , ear pain or sore throat. she does report increased allergy symptoms of nasal congesion and drainage, which is not uncommon this time of the year. Denies chest congestion, or cough productive of sputum. Denies chest pain, palpitations, PND, orthopnea or leg swelling. Denies abdominal pain, nausea, vomitting, diarrhea or constipation. Denies change in bowel movements or bloody stool. Denies dysuria , frequency, incontinence or hesitancy. Denies  joint pain, swelling, or reduced mobility. Denies headaches, vertigo, seizures. Denies depression, anxiety or insomnia. Denies  rash, lesions, or itch.      Allergies: No Known Drug Allergies  Review of Systems      See HPI Eyes:  Denies blurring, discharge, eye pain, and red eye. Endo:  Denies cold intolerance, excessive hunger, excessive thirst, and excessive urination. Heme:  Denies abnormal bruising and bleeding. Allergy:  Complains of seasonal allergies; denies hives or rash and persistent infections.  Physical Exam  General:  Well-developed,obese,in no acute distress; alert,appropriate and cooperative throughout examination HEENT: No facial asymmetry,  EOMI, No sinus tenderness, TM's Clear, oropharynx  pink and moist. erythema and edema of nasal mucosa  Chest: Clear to auscultation bilaterally.  CVS: S1, S2, No murmurs, No S3.   Abd:  Soft, Nontender.  MS: Adequate ROM spine, hips, shoulders and knees.  Ext: No edema.   CNS: CN 2-12 intact, power tone and sensation normal throughout.   Skin: Intact, no visible lesions or rashes.  Psych: Good eye contact, normal affect.  Memory intact, not anxious or depressed appearing.    Impression & Recommendations:  Problem # 1:  PALPITATIONS (ICD-785.1) Assessment Improved  The following medications were removed from the medication list:    Metoprolol Tartrate 25 Mg Tabs (Metoprolol tartrate) .Marland Kitchen... Take 1 tablet by mouth two times a day  Problem # 2:  ALLERGIC RHINITIS, SEASONAL (ICD-477.0) Assessment: Deteriorated flonase prescribed  Problem # 3:  GERD (ICD-530.81) Assessment: Unchanged prevacid daily  Problem # 4:  OBESITY NOS (ICD-278.00) Assessment: Unchanged  Ht: 57 (07/15/2010)   Wt: 195 (08/16/2010)   BMI: 43.27 (07/15/2010) therapeutic lifestyle change discussed and encouraged start phentermine half daily  Complete Medication List: 1)  Ortho Tri-cyclen Lo 0.025 Mg Tabs (Norgestimate-ethinyl estradiol) .... Take 1 tablet by mouth once a day 2)  Clotrimazole-betamethasone 1-0.05 % Crea (Clotrimazole-betamethasone) .... Apply twice daily as needed 3)  Nystatin 100000 Unit/gm Powd (Nystatin) .... Use twice daily as needed 4)  Vitamin D (ergocalciferol) 50000 Unit Caps (Ergocalciferol) .... One capsule once weekly 5)  Prevacid 30 Mg  .... I cap daily 6)  Bayer 500 Mg  .... 2 tab as needed 7)  Aleve  .Marland Kitchen.. 2 tab as needed 8)  Alprazolam 0.5 Mg Tabs (Alprazolam) .... One tab by mouth once daily 9)  Phentermine Hcl 37.5 Mg Tabs (Phentermine hcl) .... Take  1 tablet by mouth once a day 10)  Flonase 50 Mcg/act Susp (Fluticasone propionate) .... One to two puffs per nostril daily as needed for uncontrolled allergies  Patient Instructions: 1)  Please schedule a follow-up appointment in 2 months. 2)  It is important that you exercise regularly at least 30 minutes 5  times a week. If you develop chest pain, have severe difficulty breathing, or feel very tired , stop exercising immediately and seek medical attention. 3)  You need to lose weight. Consider a lower calorie diet and regular exercise.  4)  dO nOT resume metoprolol. 5)  Start phentermine hALF daily 6)  Check your Blood Pressure regularly. If it is above150 you should make an appointment. 7)  pLS follow the dASH diet  8)  bP is 130/90 Prescriptions: FLONASE 50 MCG/ACT SUSP (FLUTICASONE PROPIONATE) one to two puffs per nostril daily as needed for uncontrolled allergies  #1 x 3   Entered and Authorized by:   Syliva Overman MD   Signed by:   Syliva Overman MD on 08/16/2010   Method used:   Electronically to        [facility] [address] [phone]* (retail)       [address] [place], [place]  [address]       Ph: [phone]       Fax: [phone]   RxID:   [id number] PHENTERMINE HCL 37.5 MG TABS (PHENTERMINE HCL) Take 1 tablet by mouth once a day  #30 x 0   Entered and Authorized by:   Syliva Overman MD   Signed by:   Syliva Overman MD on 08/16/2010   Method used:   Printed then faxed to ...       [facility] [address] [phone]* (retail)       [address] [place], [place]  [address]       Ph: [phone]       Fax: [phone]   RxID:   [id number]    Orders Added: 1)  Est. Patient Level IV [16010]

## 2010-10-19 NOTE — Assessment & Plan Note (Signed)
Summary: sick - room 3   Vital Signs:  Patient profile:   40 year old female Menstrual status:  irregular Height:      57 inches Weight:      193.25 pounds BMI:     41.97 O2 Sat:      99 % on Room air Pulse rate:   104 / minute Resp:     16 per minute BP sitting:   130 / 90  (left arm)  Vitals Entered By: Adella Hare LPN (November 23, 452 9:32 AM)  Nutrition Counseling: Patient's BMI is greater than 25 and therefore counseled on weight management options.  Serial Vital Signs/Assessments:  Time      Position  BP       Pulse  Resp  Temp     By                     116/84                         Esperanza Sheets PA  CC: sinus pressure, sore throat, runny nose, congestion x 3 days Is Patient Diabetic? No Pain Assessment Patient in pain? no        CC:  sinus pressure, sore throat, runny nose, and congestion x 3 days.  History of Present Illness: Pt c/o sinus pressure & congestion x 4 days.  Progressively worsening.  Her mucus is either clear or yellow.  She has been taking Sudafed & an otc antihistamine to try to help.  No fever or chills.  No cough, but does feel like it's moving down into her chest.   Her throat is very sore.  She is a nonsmoker.  She has same syptoms in Jan of this yr & cleared up completely with antibiotics.  Pt has no hx of htn.  Takes metoprolol for palpitations.    Current Medications (verified): 1)  Alprazolam 0.5 Mg  Tabs (Alprazolam) .... Take 1 Tablet By Mouth Once A Day 2)  Ortho Tri-Cyclen Lo 0.025 Mg  Tabs (Norgestimate-Ethinyl Estradiol) .... Take 1 Tablet By Mouth Once A Day 3)  Omeprazole 40 Mg Cpdr (Omeprazole) .... Take 1 Tablet By Mouth Once A Day 4)  Metoprolol Tartrate 25 Mg Tabs (Metoprolol Tartrate) .... Take 1 Tablet By Mouth Two Times A Day 5)  Astepro 0.15 % Soln (Azelastine Hcl) .Marland Kitchen.. 1 Puff Per Nostril Once Daily 6)  Phentermine Hcl 37.5 Mg Caps (Phentermine Hcl) .... Take 1 Capsule By Mouth Once A Day 7)  Tessalon Perles 100 Mg Caps  (Benzonatate) .... Take 1 Capsule By Mouth Three Times A Day 8)  Fluconazole 150 Mg Tabs (Fluconazole) .... Take 1 Tablet By Mouth Once A Day As Needed  Allergies (verified): No Known Drug Allergies  Past History:  Past medical history reviewed for relevance to current acute and chronic problems.  Past Medical History: Reviewed history from 07/13/2007 and no changes required. Current Problems:  ANXIETY DISORDER, GENERALIZED (ICD-300.02) DEPRESSION (ICD-311) GERD (ICD-530.81) ALLERGIC RHINITIS, SEASONAL (ICD-477.0) OBESITY NOS (ICD-278.00)  Review of Systems General:  Denies chills and fever. ENT:  Complains of nasal congestion, postnasal drainage, sinus pressure, and sore throat; denies ear discharge. CV:  Denies chest pain or discomfort and palpitations. Resp:  Denies cough, shortness of breath, sputum productive, and wheezing. Heme:  Denies enlarge lymph nodes.  Physical Exam  General:  Well-developed,well-nourished,in no acute distress; alert,appropriate and cooperative throughout examination Head:  Normocephalic and atraumatic  without obvious abnormalities. No apparent alopecia or balding. Ears:  External ear exam shows no significant lesions or deformities.  Otoscopic examination reveals clear canals, tympanic membranes are intact bilaterally without bulging, retraction, inflammation or discharge. Hearing is grossly normal bilaterally. Nose:  External nasal examination shows no deformity or inflammation. Nasal mucosa are pink and moist without lesions or exudates.L frontal sinus tenderness and R frontal sinus tenderness.   Mouth:  Oral mucosa and oropharynx without lesions or exudates.  Teeth in good repair. Neck:  No deformities, masses, or tenderness noted. Lungs:  Normal respiratory effort, chest expands symmetrically. Lungs are clear to auscultation, no crackles or wheezes. Heart:  Normal rate and regular rhythm. S1 and S2 normal without gallop, murmur, click, rub or other  extra sounds. Cervical Nodes:  No lymphadenopathy noted Psych:  Cognition and judgment appear intact. Alert and cooperative with normal attention span and concentration. No apparent delusions, illusions, hallucinations   Impression & Recommendations:  Problem # 1:  ACUTE SINUSITIS, UNSPECIFIED (ICD-461.9) Assessment New  Her updated medication list for this problem includes:    Astepro 0.15 % Soln (Azelastine hcl) .Marland Kitchen... 1 puff per nostril once daily    Tessalon Perles 100 Mg Caps (Benzonatate) .Marland Kitchen... Take 1 capsule by mouth three times a day    Azithromycin 250 Mg Tabs (Azithromycin) .Marland Kitchen... 2 today, then 1 daily x 4 days  Instructed on treatment. Warm compresses to sinuses prn.  May also continue otc cold medications prn. Call if symptoms persist or worsen.   Problem # 2:  OBESITY NOS (ICD-278.00) Assessment: Improved Wt down 3 # from Jan 2011 OV.  Ht: 57 (11/23/2009)   Wt: 193.25 (11/23/2009)   BMI: 41.97 (11/23/2009)  Problem # 3:  PALPITATIONS (ICD-785.1) Assessment: Comment Only  Her updated medication list for this problem includes:    Metoprolol Tartrate 25 Mg Tabs (Metoprolol tartrate) .Marland Kitchen... Take 1 tablet by mouth two times a day  Complete Medication List: 1)  Alprazolam 0.5 Mg Tabs (Alprazolam) .... Take 1 tablet by mouth once a day 2)  Ortho Tri-cyclen Lo 0.025 Mg Tabs (Norgestimate-ethinyl estradiol) .... Take 1 tablet by mouth once a day 3)  Omeprazole 40 Mg Cpdr (Omeprazole) .... Take 1 tablet by mouth once a day 4)  Metoprolol Tartrate 25 Mg Tabs (Metoprolol tartrate) .... Take 1 tablet by mouth two times a day 5)  Astepro 0.15 % Soln (Azelastine hcl) .Marland Kitchen.. 1 puff per nostril once daily 6)  Phentermine Hcl 37.5 Mg Caps (Phentermine hcl) .... Take 1 capsule by mouth once a day 7)  Tessalon Perles 100 Mg Caps (Benzonatate) .... Take 1 capsule by mouth three times a day 8)  Fluconazole 150 Mg Tabs (Fluconazole) .... Take 1 tablet by mouth once a day as needed 9)   Azithromycin 250 Mg Tabs (Azithromycin) .... 2 today, then 1 daily x 4 days  Patient Instructions: 1)  Please schedule a follow-up appointment as needed. 2)  It is important that you exercise regularly at least 20 minutes 5 times a week. If you develop chest pain, have severe difficulty breathing, or feel very tired , stop exercising immediately and seek medical attention. 3)  You need to lose weight. Consider a lower calorie diet and regular exercise.  4)  Get plenty of rest, drink lots of clear liquids, and use Tylenol or Ibuprofen for fever and comfort. Return in 7-10 days if you're not better:sooner if you're feeling worse. Prescriptions: AZITHROMYCIN 250 MG TABS (AZITHROMYCIN) 2 today, then 1 daily  x 4 days  #6 x 0   Entered and Authorized by:   Esperanza Sheets PA   Signed by:   Esperanza Sheets PA on 11/23/2009   Method used:   Electronically to        [facility] [phone]* (retail)       [address] [place]  [address]       Ph: [phone]       Fax: [phone]   RxID:   [id number]

## 2010-10-19 NOTE — Letter (Signed)
Summary: Out of Work  [facility]  [address]   [place], [place] [address]   Phone: [phone]  Fax: [phone]    September 28, 2009   Employee:  [name] [name]    To Whom It May Concern:   For Medical reasons, please excuse the above named employee from work for the following dates:  Start:   09/28/2009  End:   09/30/2009  If you need additional information, please feel free to contact our office.         Sincerely,    Milus Mallick. Lodema Hong, M.D.

## 2010-10-19 NOTE — Letter (Signed)
Summary: labs  labs   Imported By: Curtis Sites 02/16/2010 14:36:52  _____________________________________________________________________  External Attachment:    Type:   Image     Comment:   External Document

## 2010-10-19 NOTE — Miscellaneous (Signed)
  Clinical Lists Changes  Medications: Added new medication of VITAMIN D (ERGOCALCIFEROL) 50000 UNIT CAPS (ERGOCALCIFEROL) one capsule once weekly - Signed Rx of VITAMIN D (ERGOCALCIFEROL) 50000 UNIT CAPS (ERGOCALCIFEROL) one capsule once weekly;  #4 x 4;  Signed;  Entered by: Syliva Overman MD;  Authorized by: Syliva Overman MD;  Method used: Historical    Prescriptions: VITAMIN D (ERGOCALCIFEROL) 50000 UNIT CAPS (ERGOCALCIFEROL) one capsule once weekly  #4 x 4   Entered and Authorized by:   Syliva Overman MD   Signed by:   Syliva Overman MD on 07/14/2010   Method used:   Historical   RxID:   [id number]

## 2010-10-19 NOTE — Letter (Signed)
Summary: consult  consult   Imported By: Curtis Sites 02/16/2010 14:35:20  _____________________________________________________________________  External Attachment:    Type:   Image     Comment:   External Document

## 2010-10-19 NOTE — Assessment & Plan Note (Signed)
Summary: office visit   Vital Signs:  Patient profile:   40 year old female Menstrual status:  irregular Height:      57 inches Weight:      196 pounds O2 Sat:      98 % Pulse rate:   86 / minute Pulse rhythm:   regular Resp:     16 per minute BP sitting:   124 / 80 Cuff size:   large  Vitals Entered By: Everitt Amber (September 28, 2009 9:00 AM) CC: thinks she has sinus infection, ears stopped up, sinus pressure, headache, some discolored mucus. Some achiness, denies fever   CC:  thinks she has sinus infection, ears stopped up, sinus pressure, headache, some discolored mucus. Some achiness, and denies fever.  History of Present Illness: 6 day h/o head congestion, bilateral earpain and sore throat which is progressively worsening. She ahs also had intermittent chills , but no documented fever. She states that she intend sto lose weight this yr by resuming phentermine and joining a weight loss program.   Current Medications (verified): 1)  Alprazolam 0.5 Mg  Tabs (Alprazolam) .... Take 1 Tablet By Mouth Once A Day 2)  Ortho Tri-Cyclen Lo 0.025 Mg  Tabs (Norgestimate-Ethinyl Estradiol) .... Take 1 Tablet By Mouth Once A Day 3)  Omeprazole 40 Mg Cpdr (Omeprazole) .... Take 1 Tablet By Mouth Once A Day 4)  Metoprolol Tartrate 25 Mg Tabs (Metoprolol Tartrate) .... Take 1 Tablet By Mouth Two Times A Day 5)  Astepro 0.15 % Soln (Azelastine Hcl) .Marland Kitchen.. 1 Puff Per Nostril Once Daily 6)  Phentermine Hcl 37.5 Mg Caps (Phentermine Hcl) .... Take 1 Capsule By Mouth Once A Day  Allergies (verified): No Known Drug Allergies  Review of Systems General:  Complains of chills; denies fever, loss of appetite, and malaise. ENT:  Complains of earache, nasal congestion, postnasal drainage, sinus pressure, and sore throat; 6 day histyory of bilateral ear pressure, sinus pressure, yellow nasal drainage, and sore throat.. CV:  Denies chest pain or discomfort, palpitations, and swelling of feet. Resp:   Complains of cough; dry cough. GI:  Denies abdominal pain, constipation, diarrhea, nausea, and vomiting. GU:  Denies dysuria and urinary frequency. MS:  Denies joint pain and stiffness. Neuro:  Complains of headaches; denies poor balance, seizures, and sensation of room spinning; mild frontal headache associated with sinus pressure. Psych:  Denies anxiety and depression. Endo:  Denies cold intolerance, excessive hunger, excessive thirst, excessive urination, heat intolerance, polyuria, and weight change. Heme:  Denies abnormal bruising and bleeding. Allergy:  Complains of seasonal allergies.  Physical Exam  General:  Well-developedobese,in no acute distress; alert,appropriate and cooperative throughout examination. pt hoarse. HEENT: No facial asymmetry,  EOMI, frontal and maxillary sinus tendernesssinus tenderness, TM's Clear, oropharynx  pink and moist.   Chest: Clear to auscultation bilaterally.  CVS: S1, S2, No murmurs, No S3.   Abd: Soft, Nontender.  MS: Adequate ROM spine, hips, shoulders and knees.  Ext: No edema.   CNS: CN 2-12 intact, power tone and sensation normal throughout.   Skin: Intact, no visible lesions or rashes.  Psych: Good eye contact, normal affect.  Memory intact, not anxious or depressed appearing.    Impression & Recommendations:  Problem # 1:  ACUTE SINUSITIS, UNSPECIFIED (ICD-461.9) Assessment Comment Only  Her updated medication list for this problem includes:    Astepro 0.15 % Soln (Azelastine hcl) .Marland Kitchen... 1 puff per nostril once daily    Zithromax Z-pak 250 Mg Tabs (Azithromycin) .Marland KitchenMarland KitchenMarland KitchenMarland Kitchen  Use as directed    Tessalon Perles 100 Mg Caps (Benzonatate) .Marland Kitchen... Take 1 capsule by mouth three times a day  Problem # 2:  GERD (ICD-530.81) Assessment: Unchanged  Her updated medication list for this problem includes:    Omeprazole 40 Mg Cpdr (Omeprazole) .Marland Kitchen... Take 1 tablet by mouth once a day  Problem # 3:  OBESITY NOS (ICD-278.00) Assessment:  Deteriorated  Ht: 57 (09/28/2009)   Wt: 196 (09/28/2009)   BMI: 41.81 (07/20/2009)  Problem # 4:  ANXIETY DISORDER, GENERALIZED (ICD-300.02) Assessment: Improved  Her updated medication list for this problem includes:    Alprazolam 0.5 Mg Tabs (Alprazolam) .Marland Kitchen... Take 1 tablet by mouth once a day  Complete Medication List: 1)  Alprazolam 0.5 Mg Tabs (Alprazolam) .... Take 1 tablet by mouth once a day 2)  Ortho Tri-cyclen Lo 0.025 Mg Tabs (Norgestimate-ethinyl estradiol) .... Take 1 tablet by mouth once a day 3)  Omeprazole 40 Mg Cpdr (Omeprazole) .... Take 1 tablet by mouth once a day 4)  Metoprolol Tartrate 25 Mg Tabs (Metoprolol tartrate) .... Take 1 tablet by mouth two times a day 5)  Astepro 0.15 % Soln (Azelastine hcl) .Marland Kitchen.. 1 puff per nostril once daily 6)  Phentermine Hcl 37.5 Mg Caps (Phentermine hcl) .... Take 1 capsule by mouth once a day 7)  Zithromax Z-pak 250 Mg Tabs (Azithromycin) .... Use as directed 8)  Tessalon Perles 100 Mg Caps (Benzonatate) .... Take 1 capsule by mouth three times a day 9)  Fluconazole 150 Mg Tabs (Fluconazole) .... Take 1 tablet by mouth once a day as needed  Patient Instructions: 1)  Please schedule a follow-up appointment in 2 months. 2)  It is important that you exercise regularly at least 30 minutes 5 times a week. If you develop chest pain, have severe difficulty breathing, or feel very tired , stop exercising immediately and seek medical attention. 3)  You need to lose weight. Consider a lower calorie diet and regular exercise.  4)  Acute sinusitis symptoms for less than 10 days are not helped by antibiotics.Use warm moist compresses, and over the counter decongestants ( only as directed). Call if no improvement in 5-7 days, sooner if increasing pain, fever, or new symptoms. 5)  resume phentermine. 6)  meds are sent in for sinusitis Prescriptions: PHENTERMINE HCL 37.5 MG CAPS (PHENTERMINE HCL) Take 1 capsule by mouth once a day  #30 x 0    Entered by:   Everitt Amber   Authorized by:   Syliva Overman MD   Signed by:   Everitt Amber on 09/28/2009   Method used:   Printed then faxed to ...       [facility] [address] [phone]* (retail)       [address] [place], [place]  [address]       Ph: [phone]       Fax: [phone]   RxID:   [id number] FLUCONAZOLE 150 MG TABS (FLUCONAZOLE) Take 1 tablet by mouth once a day as needed  #3 x 0   Entered and Authorized by:   Syliva Overman MD   Signed by:   Syliva Overman MD on 09/28/2009   Method used:   Electronically to        [facility] [address] [phone]* (retail)       [address], [place]  [address]       Ph: [phone]  Fax: [phone]   RxID:   [id number] TESSALON PERLES 100 MG CAPS (BENZONATATE) Take 1 capsule by mouth three times a day  #21 x 0   Entered and Authorized by:   Syliva Overman MD   Signed by:   Syliva Overman MD on 09/28/2009   Method used:   Electronically to        [facility] [address] [phone]* (retail)       [address] [place], [place]  [address]       Ph: [phone]       Fax: [phone]   RxID:   [id number] ZITHROMAX Z-PAK 250 MG TABS (AZITHROMYCIN) Use as directed  #6 x 0   Entered and Authorized by:   Syliva Overman MD   Signed by:   Syliva Overman MD on 09/28/2009   Method used:   Electronically to        [facility] [address] [phone]* (retail)       [address], [place]  [address]       Ph: [phone]       Fax: [phone]   RxID:   [id number]

## 2010-10-20 ENCOUNTER — Encounter: Payer: Self-pay | Admitting: Family Medicine

## 2010-10-27 NOTE — Letter (Signed)
Summary: 1st missed letter  1st missed letter   Imported By: Lind Guest 10/20/2010 09:26:14  _____________________________________________________________________  External Attachment:    Type:   Image     Comment:   External Document

## 2010-12-16 ENCOUNTER — Telehealth: Payer: Self-pay | Admitting: Family Medicine

## 2010-12-16 NOTE — Telephone Encounter (Signed)
She thinks she has a sinus infections she states it is really bad wanted an appt for today if anyway possible.  She said that she can't call out this weekend, because she is the only one working.  Please advise.

## 2010-12-17 NOTE — Telephone Encounter (Signed)
Called patient left message

## 2011-01-11 ENCOUNTER — Encounter: Payer: Self-pay | Admitting: Family Medicine

## 2011-01-12 ENCOUNTER — Encounter: Payer: Self-pay | Admitting: Family Medicine

## 2011-01-12 ENCOUNTER — Encounter: Payer: Self-pay | Admitting: *Deleted

## 2011-01-12 ENCOUNTER — Ambulatory Visit (INDEPENDENT_AMBULATORY_CARE_PROVIDER_SITE_OTHER): Payer: BC Managed Care – PPO | Admitting: Family Medicine

## 2011-01-12 VITALS — BP 142/92 | HR 84 | Resp 16 | Ht <= 58 in | Wt 194.4 lb

## 2011-01-12 DIAGNOSIS — R7301 Impaired fasting glucose: Secondary | ICD-10-CM

## 2011-01-12 DIAGNOSIS — K219 Gastro-esophageal reflux disease without esophagitis: Secondary | ICD-10-CM

## 2011-01-12 DIAGNOSIS — J019 Acute sinusitis, unspecified: Secondary | ICD-10-CM

## 2011-01-12 DIAGNOSIS — J209 Acute bronchitis, unspecified: Secondary | ICD-10-CM

## 2011-01-12 DIAGNOSIS — J40 Bronchitis, not specified as acute or chronic: Secondary | ICD-10-CM

## 2011-01-12 DIAGNOSIS — E669 Obesity, unspecified: Secondary | ICD-10-CM

## 2011-01-12 DIAGNOSIS — Z13820 Encounter for screening for osteoporosis: Secondary | ICD-10-CM

## 2011-01-12 MED ORDER — FLUCONAZOLE 150 MG PO TABS: 150.0000 mg | ORAL_TABLET | Freq: Once | ORAL | Status: AC

## 2011-01-12 MED ORDER — PENICILLIN V POTASSIUM 500 MG PO TABS: 500.0000 mg | ORAL_TABLET | Freq: Three times a day (TID) | ORAL | Status: AC

## 2011-01-12 MED ORDER — NYSTATIN 100000 UNIT/GM EX POWD: Freq: Two times a day (BID) | CUTANEOUS | Status: DC

## 2011-01-12 MED ORDER — BENZONATATE 100 MG PO CAPS: 100.0000 mg | ORAL_CAPSULE | Freq: Four times a day (QID) | ORAL | Status: DC | PRN

## 2011-01-12 MED ORDER — ALPRAZOLAM 0.5 MG PO TABS: 0.5000 mg | ORAL_TABLET | Freq: Every evening | ORAL | Status: DC | PRN

## 2011-01-12 NOTE — Progress Notes (Signed)
  Subjective:    Patient ID: [name] [name], female    DOB: Jul 13, 1971, 40 y.o.   MRN: [id number]  HPI 1 week h/o green sputum and green post nasal drainage. No fever or chills. She had sinus congestion for the past 3 months and has been using primarily flonase. Did not work yesterday, she has been hoarse. Pt is concerned about her weight and intends to work on this aggresively to ensure weight loss.   Review of Systems See HPI Denies chest pains, palpitations, paroxysmal nocturnal dyspnea, orthopnea and leg swelling Denies abdominal pain, nausea, vomiting,diarrhea or constipation.  Denies rectal bleeding or change in bowel movement. Denies dysuria, frequency, hesitancy or incontinence. Denies joint pain, swelling and limitation in mobility. Ocasional  Headaches,denies  seizure, numbness, or tingling. Denies depression, anxiety or insomnia. Denies skin break down or rash.        Objective:   Physical Exam Patient alert and oriented and in no Cardiopulmonary distress.  HEENT: No facial asymmetry, EOMI, frontal sinus tenderness, TM's clear, Oropharynx pink and moist.  Neck supple anterior bilateral cervical adenitis.  Chest: decreased air entry scattered crackles, no wheezes CVS: S1, S2 no murmurs, no S3.  ABD: Soft non tender. Bowel sounds normal.  Ext: No edema  MS: Adequate ROM spine, shoulders, hips and knees.  Skin: Intact, no ulcerations or rash noted.  Psych: Good eye contact, normal affect. Memory intact not anxious or depressed appearing.  CNS: CN 2-12 intact, power, tone and sensation normal throughout.        Assessment & Plan:

## 2011-01-12 NOTE — Patient Instructions (Signed)
F/u in 4 months.  Weight loss goal is 8 pounds.  You are being treated for bronchitis.Meds are sent in  Work excuse for 4/24 and 4/25 only  hBA1C and Vit D today.  Pls take OTC Vit D3 800IU once daily  It is important that you exercise regularly at least 30 minutes 5 times a week. If you develop chest pain, have severe difficulty breathing, or feel very tired, stop exercising immediately and seek medical attention  A healthy diet is rich in fruit, vegetables and whole grains. Poultry fish, nuts and beans are a healthy choice for protein rather then red meat. A low sodium diet and drinking 64 ounces of water daily is generally recommended. Oils and sweet should be limited. Carbohydrates especially for those who are diabetic or overweight, should be limited to 34-45 gram per meal. It is important to eat on a regular schedule, at least 3 times daily. Snacks should be primarily fruits, vegetables or nuts.  Blood pressure is 140/90 which is high. You will get the DASH diet

## 2011-01-26 NOTE — Assessment & Plan Note (Signed)
Controlled, no change in medication  

## 2011-01-26 NOTE — Assessment & Plan Note (Signed)
Deteriorated. Patient re-educated about  the importance of commitment to a  minimum of 150 minutes of exercise per week. The importance of healthy food choices with portion control discussed. Encouraged to start a food diary, count calories and to consider  joining a support group. Sample diet sheets offered. Goals set by the patient for the next several months.    

## 2011-01-26 NOTE — Assessment & Plan Note (Signed)
Antibiotics prescribed and work excuse granted. Pt educated re impt of taking entire antibiotic course

## 2011-01-26 NOTE — Assessment & Plan Note (Signed)
3 week h/o sinus pressure and drainge which is worsening , antibiotic prescribed, primarily for acute bronchitis

## 2011-01-28 ENCOUNTER — Other Ambulatory Visit: Payer: Self-pay

## 2011-01-28 MED ORDER — ALPRAZOLAM 0.5 MG PO TABS: 0.5000 mg | ORAL_TABLET | Freq: Every evening | ORAL | Status: DC | PRN

## 2011-02-04 NOTE — Op Note (Signed)
NAME:  [name] [name], [name] [name]                  ACCOUNT NO.:  [id number]   MEDICAL RECORD NO.:  [id number]          PATIENT TYPE:  AMB   LOCATION:  SDC                           FACILITY:  [facility]   PHYSICIAN:  Maxie Better, M.D.DATE OF BIRTH:  December 22, 1970   DATE OF PROCEDURE:  05/26/2006  DATE OF DISCHARGE:                                 OPERATIVE REPORT   PREOPERATIVE DIAGNOSIS:  Menometrorrhagia, endometrial mass.   PROCEDURE:  Diagnostic hysteroscopy, hysteroscopic resection of endometrial  polyp, dilation and curettage.   POSTOPERATIVE DIAGNOSIS:  Menometrorrhagia, endometrial mass.   ANESTHESIA:  General,  paracervical block.   SURGEON:  Maxie Better, M.D.   PROCEDURE:  Under adequate general anesthesia, the patient was placed in the  dorsolithotomy position.  She was sterilely prepped and draped in usual  fashion.  Bladder was catheterized of small amount of urine.  Examination  under anesthesia revealed anteverted uterus without any adnexal masses.  Bivalve speculum was placed in the vagina.  Ten mL of 1% Nesacaine was  injected at 3 and 9 o'clock paracervically.  The anterior lip of the cervix  was grasped with a single-tooth tenaculum.  The cervix was then serially  dilated up to a #25 Pratt dilator.  A sorbitol primed resectoscope with a  double loop was introduced into the uterine cavity without incident.  Polypoid mass of arising from the posterior wall of the uterus was noted on  the left.  Both tubal ostia could be seen.  The polyp was resected.  No  other lesions were noted.  The resectoscope was then removed.  The cavity  was then curetted.  The endocervical canal was inspected.  No lesions noted.  The hysteroscope was then reinserted.  The cavity inspected.  No visible  lesions noted at which time all instruments were then removed from the  vagina.  The procedure was felt to have been complete.   Specimen labeled endometrial polyp and endometrial curettings and was  sent  to pathology.  Estimated blood loss was minimal.  Fluid deficit was 100 mL.  Complication was none.  The patient tolerated the procedure well and was  transferred to recovery in stable condition.      Maxie Better, M.D.  Electronically Signed     Circle/MEDQ  D:  05/26/2006  T:  05/26/2006  Job:  [id number]   cc:   Milus Mallick. Lodema Hong, M.D.  Fax: [phone]

## 2011-02-04 NOTE — Op Note (Signed)
NAME:  [name] [name], [name] [name]                  ACCOUNT NO.:  [id number]   MEDICAL RECORD NO.:  [id number]          PATIENT TYPE:  AMB   LOCATION:  DAY                           FACILITY:  [facility]   PHYSICIAN:  R. Roetta Sessions, M.D. DATE OF BIRTH:  [birth year]   DATE OF PROCEDURE:  11/30/2005  DATE OF DISCHARGE:                                 OPERATIVE REPORT   PROCEDURE:  Diagnostic esophagogastroduodenoscopy.   ENDOSCOPIST:  Gerrit Friends. Rourk, M.D.   INDICATIONS FOR PROCEDURE:  This is a 40 year old lady with intractable  gastroesophageal reflux disease symptoms on Aciphex b.i.d.  She had a  prior  ENT evaluation and she has been seen by Dr. 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  We switched her to Prevacid 30 mg  orally b.i.d. just last week, and already she has noted a dramatic  improvement in her symptoms.  EGD is now being done.  This approach has been  discussed with the patient at length.  The potential risks, benefits, and  alternatives have been reviewed; and questions answered.  She is agreeable.  Please see the documentation in the medical record   PROCEDURE NOTE:  O2 saturation, blood pressure, pulse and respirations were  monitored throughout the entire procedure.   CONSCIOUS SEDATION:  Versed 4 mg IV, Demerol 100 mg IV in divided doses.  Cetacaine spray for topical oropharyngeal anesthesia.   INSTRUMENT:  Olympus video chip system.   FINDINGS:  Examination of the tubular esophagus revealed no mucosal  abnormalities.  The EG junction was easily traversed.   STOMACH:  The gastric cavity was empty.  It insufflated well with air.  A  thorough examination of the gastric mucosa including a retroflex view of the  proximal stomach and esophagogastric junction demonstrated only a small  hiatal hernia. The gastric mucosa, otherwise appeared entirely normal.  The  pylorus was patent and easily traversed.  Examination of the bulb and second  portion revealed  no abnormalities.   THERAPEUTIC/DIAGNOSTIC MANEUVERS:  None.   The patient tolerated the procedure well and was reacted in endoscopy.   IMPRESSION:  1.  Normal esophagus.  2.  Small hiatal hernia.  3.  Otherwise normal stomach, normal D1 and D2.   RECOMMENDATIONS:  1.  Hiatal hernia literature provided to [name].  2.  Continue Prevacid 30 mg orally b.i.d. for the next 3 months.  3.  We will arrange for her to see [place] back in 3 months.  We can access her      progress then.      Jonathon Bellows, M.D.  Electronically Signed     RMR/MEDQ  D:  11/30/2005  T:  12/02/2005  Job:  [id number]   cc:   Milus Mallick. Lodema Hong, M.D.  Fax: [phone]   Kinnie Scales. Annalee Genta, M.D.  Fax: [phone]

## 2011-02-04 NOTE — Consult Note (Signed)
NAME:  [name], [name]                      ACCOUNT NO.:  0   MEDICAL RECORD NO.:  [id number]           PATIENT TYPE:   LOCATION:                                 FACILITY:   PHYSICIAN:  R. Roetta Sessions, M.D. DATE OF BIRTH:  03/30/1971   DATE OF CONSULTATION:  11/25/2005  DATE OF DISCHARGE:                                   CONSULTATION   REASON FOR CONSULTATION:  Gastroesophageal reflux disease.   HISTORY OF PRESENT ILLNESS:  [name] [name] is a 40 year old, African-American patient  of Dr. Syliva Overman who presents today for further evaluation of  refractory gastroesophageal reflux disease.  We last saw her in October  2004, for the same.  At that time, upper endoscopy was suggested, but she  opted to try PPI therapy on a more regular basis.  She says she has been  having difficulty with acid reflux disease every since, but about 3 months  ago, she also developed hoarseness.  She was on Prevacid with adequate  control, but because of insurance changes, she had to start taking Aciphex.  Aciphex does not control her symptoms.  In the past, she has also been on  Protonix, Nexium and Prilosec with inadequate control of her acid reflux  symptoms.  She complains of intractable heartburn.  She has to eat an  extremely bland diet.  She has eliminated all caffeine and spicy foods from  her diet.  She has gained 15 pounds since we last saw her.  She also has  chronic hoarseness.  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  She  denies any dysphagia, odynophagia, nausea, vomiting, abdominal pain,  constipation, diarrhea, melena or rectal bleeding.   CURRENT MEDICATIONS:  1.  Aciphex 20 mg b.i.d.  2.  Zyrtec daily.  3.  Xanax 0.25 mg at bedtime p.r.n.   ALLERGIES:  No known drug allergies.   PAST MEDICAL HISTORY:  1.  Chronic gastroesophageal reflux disease.  2.  Seasonal allergies.  3.  Obesity.  4.  Anxiety.   PAST SURGICAL HISTORY:  Cesarean section.   FAMILY HISTORY:  Father has  diabetes and heart disease.  Maternal  grandfather had colon cancer in his 30s.  Paternal aunt had breast cancer.  Paternal grandfather died of unknown type cancer.   SOCIAL HISTORY:  She is recently separated.  She has a 58-year-old son.  She  is employed with the [employer] [place] at [employer].  Denies any tobacco use.  Occasionally consumes alcohol.   REVIEW OF SYSTEMS:  GASTROINTESTINAL:  See HPI.  CONSTITUTIONAL:  A 15 pound  weight gain in the last 2 years.  CARDIOPULMONARY:  No chest pain or  shortness of breath.   PHYSICAL EXAMINATION:  VITAL SIGNS:  Weight 200, height 4 feet 10 inches,  temperature 98, blood pressure 122/80, pulse 98.  GENERAL:  Pleasant, morbidly obese, black female in no acute distress.  SKIN:  Warm and dry.  No jaundice.  HEENT:  Conjunctivae are pink.  Sclerae nonicteric.  Oropharyngeal mucosa  moist  and pink.  No lesions, erythema or exudate.  No lymphadenopathy,  thyromegaly.  LUNGS:  Clear to auscultation.  CARDIAC:  Regular rate and rhythm with normal S1, S2.  No murmurs, rubs or  gallops.  ABDOMEN:  Positive bowel sounds.  Obese, but symmetrical.  Soft, nontender.  No organomegaly or masses.  No rebound tenderness or guarding.  EXTREMITIES:  No edema.   IMPRESSION:  [name] [name] is a 40 year old lady with intractable gastroesophageal  reflux disease symptoms.  She is on Aciphex 20 mg twice a day.  She has been  on the medication x3 months.  Prior to that, she had better control on  Prevacid.  She has never had an upper endoscopy.  She describes  laryngopharyngeal reflux as well in the way of hoarseness.  This was  suggested by her ENT, Dr. Annalee Genta, as well.   RECOMMENDATIONS:  1.  Upper endoscopy in the near future.  I discussed risks, alternatives and      benefits with the patient and she is agreeable to proceed.  2.  Stop Aciphex and begin Prevacid 30 mg b.i.d., #60 with three refills,      #20 samples provided.  3.  Antireflux  measures.  4.  Further recommendations to follow.      Tana Coast, P.AJonathon Bellows, M.D.  Electronically Signed    LL/MEDQ  D:  11/25/2005  T:  11/26/2005  Job:  [id number]   cc:   Milus Mallick. Lodema Hong, M.D.  Fax: [phone]

## 2011-02-04 NOTE — Discharge Summary (Signed)
[facility]  Patient:    [name] [name], [name] [name] Visit Number: [id number] MRN: [id number]          Service Type: OBS Location: 4A A420 01 Attending Physician:  Tilda Burrow Admit Date:  10/08/2001 Discharge Date: 10/12/2001                             Discharge Summary  DATE OF BIRTH:  12-15-1970  DISCHARGE DIAGNOSES: 1. Status post a primary low transverse cesarean section. 2. Homero Fellers breech presentation. 3. Borderline pelvis.  PROCEDURE:  Primary low transverse cesarean section.  SURGEON:  Duane Lope, M.D.  HISTORY OF PRESENT ILLNESS:  Please refer to the transcribed history and physical and the antepartum chart for details of admission into the hospital.  HOSPITAL COURSE:  Patient was admitted for cervical ripening with a Foley bulb and induction on the night of the 20th.  She had an unfavorable cervix and a very borderline pelvis but she wanted to try labor so the decision was made by Dr. Emelda Fear and Alvino Chapel and her husband to proceed with that course.  An amniotomy was performed on the morning of the 21st and I examined the patient and she was found to be in the frank breech presentation.  Around that time decision was made to proceed with a cesarean section which went without difficulty.  Please refer to the operative note for details of the surgery. The patient tolerated procedure well.  Her postoperative course was completely unremarkable.  Her hemoglobin and hematocrit on postoperative day #1 were 12.1 and 34.4 and on postoperative day #3 was 10.9 and 31.9 with a white count of 7700.  Her incision remained clean, dry, and intact.  She tolerated her low residue regular diet without difficulty.  She ambulated without difficulty. She remained afebrile throughout her hospital course and had stable vital signs and good urine output.  Her bleeding was appropriate.  Her incision was clean, dry, and intact on the morning of discharge postoperative day  #3 on October 12, 2001.  She was discharged to home on Motrin and Percocet for pain. Instructions to follow up in the office on Tuesday and have her staples removed.  She was given instructions for precautions at home including using Dulcolax suppositories to continue to be passing flatus.  All questions were answered and she was discharged. Attending Physician:  Tilda Burrow DD:  10/12/01 TD:  10/14/01 Job: [id number]

## 2011-02-04 NOTE — Op Note (Signed)
[facility]  Patient:    [name] [name], [name] [name] Visit Number: [id number] MRN: [id number]          Service Type: OBS Location: 4A A425 01 Attending Physician:  Tilda Burrow Dictated by:   Duane Lope, M.D. Proc. Date: 10/09/01 Admit Date:  10/08/2001                             Operative Report  PREOPERATIVE DIAGNOSES: 1. Intrauterine pregnancy at 40-1/[redacted] weeks gestation. 2. Borderline pelvis. 3. Short femurs in fetus. 4. Homero Fellers breach presentation.  POSTOPERATIVE DIAGNOSES: 1. Intrauterine pregnancy at 40-1/[redacted] weeks gestation. 2. Borderline pelvis. 3. Short femurs in fetus. 4. Homero Fellers breach presentation.  OPERATION:  Primary low transverse cesarean section.  SURGEON:  Duane Lope, M.D.  ANESTHESIA:  Spinal.  FINDINGS:  Over a low transverse hysterotomy incision was delivered a viable female infant at 75 with Apgars 9/9, weighing 6 pounds 14 ounces.  The infant was found to be in a frank breach presentation and was delivered by extraction without difficulty.  There was a three-vessel cord.  Cord blood was sent.  The placenta was normal and was not sent to pathology for evaluation.  The female infant was handed to Dr. Gabriel Rung and the neonatal nurse who were in attendance for routine neonatal resuscitation.  Again, his Apgars were 9/9.  There was a normal uterus with no internal septations, and the tubes and ovaries were also found to be normal.  DESCRIPTION OF PROCEDURE:  The patient was taken to the operating room after being assessed in labor and delivery.  The fetus was found to be in a frank breach presentation.  There was reassuring fetal heart rate leaving labor and delivery, and the contractions had settled down after giving patient 0.25 subcutaneous terbutaline.  The patient was taken to the OR, and anesthesia decided to proceed with a spinal anesthetic.  She was preloaded, and the spinal was placed.  Adequate anesthetic level was obtained, and  the patient was prepped and draped in the usual sterile fashion.  A low transverse Pfannenstiel incision was made and carried down sharply to the rectus fascia which was scored in the midline and extended laterally.  The fascia was taken off the muscles superiorly and inferiorly without difficulty.  The muscles were divided manually, and the peritoneal cavity was entered.  A bladder blade was placed, and the vesicouterine serosal flap was created.  A low transverse hysterotomy incision was made, and through this incision was delivered a frank breach female infant at 31 with Apgars 9/9, weighing 6 pounds 14 ounces.  The infant was again in the frank breach presentation and was delivered by routine breach extraction without difficulty.  The infant was handed off to Dr. Gabriel Rung who was in attendance for routine neonatal resuscitation.  There was a three-vessel cord.  Cord blood was sent.  The uterus was massaged, and the placenta was delivered spontaneously.  The uterus was wiped clean with a clean lap pad and was exteriorized.  It was closed in two layers, the first being a running interlocking layer and the second being an interlocking imbricating layer.  There was good hemostasis.  Two figure-of-eight sutures were placed for additional hemostasis.  The incision was found to be hemostatic at this point.  The bladder flap was reapproximated loosely with 3-0 Monocryl without difficulty.  The uterus was replaced in peritoneal cavity, and both the pericolic gutters were irrigated using warm  normal saline.  Again, hemostasis was confirmed.  The muscles were reapproximated loosely using 0 Monocryl.  The fascia was closed using 0 Vicryl in a running fashion.  The subcutaneous tissue was irrigated and made hemostatic using electrocautery unit.  The skin was closed using skin staples.  The patient tolerated the procedure well.  She experienced 500 cc of blood loss.  She received 2500 cc of  intraoperative lactated Ringers.  Her urine output remained clear throughout the case and was adequate.  She was taken to the recovery room in good stable condition with all needle, instrument, and lap pad counts being correct.  PCA pump will be started in postop recovery, and she will undergo routine postoperative care. Dictated by:   Duane Lope, M.D. Attending Physician:  Tilda Burrow DD:  10/09/01 TD:  10/10/01 Job: [id number]

## 2011-03-22 ENCOUNTER — Encounter: Payer: Self-pay | Admitting: Family Medicine

## 2011-04-04 ENCOUNTER — Encounter: Payer: Self-pay | Admitting: Family Medicine

## 2011-04-05 ENCOUNTER — Encounter: Payer: Self-pay | Admitting: Family Medicine

## 2011-04-05 ENCOUNTER — Ambulatory Visit (INDEPENDENT_AMBULATORY_CARE_PROVIDER_SITE_OTHER): Payer: BC Managed Care – PPO | Admitting: Family Medicine

## 2011-04-05 VITALS — BP 130/90 | HR 89 | Resp 16 | Ht <= 58 in | Wt 194.1 lb

## 2011-04-05 DIAGNOSIS — J301 Allergic rhinitis due to pollen: Secondary | ICD-10-CM

## 2011-04-05 DIAGNOSIS — R519 Headache, unspecified: Secondary | ICD-10-CM | POA: Insufficient documentation

## 2011-04-05 DIAGNOSIS — R7301 Impaired fasting glucose: Secondary | ICD-10-CM

## 2011-04-05 DIAGNOSIS — R51 Headache: Secondary | ICD-10-CM | POA: Insufficient documentation

## 2011-04-05 DIAGNOSIS — R002 Palpitations: Secondary | ICD-10-CM

## 2011-04-05 DIAGNOSIS — Z139 Encounter for screening, unspecified: Secondary | ICD-10-CM

## 2011-04-05 DIAGNOSIS — E669 Obesity, unspecified: Secondary | ICD-10-CM

## 2011-04-05 DIAGNOSIS — F411 Generalized anxiety disorder: Secondary | ICD-10-CM

## 2011-04-05 DIAGNOSIS — I10 Essential (primary) hypertension: Secondary | ICD-10-CM

## 2011-04-05 MED ORDER — BUTALBITAL-APAP-CAFFEINE 50-325-40 MG PO TABS: 1.0000 | ORAL_TABLET | Freq: Four times a day (QID) | ORAL | Status: DC | PRN

## 2011-04-05 MED ORDER — METOPROLOL TARTRATE 25 MG PO TABS: ORAL_TABLET | ORAL | Status: DC

## 2011-04-05 NOTE — Assessment & Plan Note (Signed)
New dx. Sub optimal control, continue metoprolol, and focus on lifestyle changes to improve BP

## 2011-04-05 NOTE — Assessment & Plan Note (Signed)
Deteriorated. Patient re-educated about  the importance of commitment to a  minimum of 150 minutes of exercise per week. The importance of healthy food choices with portion control discussed. Encouraged to start a food diary, count calories and to consider  joining a support group. Sample diet sheets offered. Goals set by the patient for the next several months.    

## 2011-04-05 NOTE — Assessment & Plan Note (Signed)
Unchanged, encouraged pt to seek therapy, she is still unwilling , but will call if she changes her mind

## 2011-04-05 NOTE — Assessment & Plan Note (Signed)
Improved, pt on once daily metoprolol

## 2011-04-05 NOTE — Assessment & Plan Note (Addendum)
New disabling headaches, neurologic exam is unermarkable, likely migraine, but since they are new, will obtain brain scan, pt has dental implants , and I will need to check with the radiology dept

## 2011-04-05 NOTE — Patient Instructions (Addendum)
F/u jn 4 months.  pls follow the dASH diet, and commit to exercise for 30 mins daily.  New med sent for headache and a brain scan is being ordered, use fioricet and ibuprofen at headache onset  Consider therapy.  hBA1c and vit d today  It is important that you exercise regularly at least 30 minutes 5 times a week. If you develop chest pain, have severe difficulty breathing, or feel very tired, stop exercising immediately and seek medical attention   A healthy diet is rich in fruit, vegetables and whole grains. Poultry fish, nuts and beans are a healthy choice for protein rather then red meat. A low sodium diet and drinking 64 ounces of water daily is generally recommended. Oils and sweet should be limited. Carbohydrates especially for those who are diabetic or overweight, should be limited to 34-45 gram per meal. It is important to eat on a regular schedule, at least 3 times daily. Snacks should be primarily fruits, vegetables or nuts.

## 2011-04-05 NOTE — Assessment & Plan Note (Signed)
Improved and controlled on medication 

## 2011-04-05 NOTE — Progress Notes (Signed)
  Subjective:    Patient ID: [name] [name], female    DOB: [birth year], 40 y.o.   MRN: [id number]  HPI 2 week h/o pressure  In frontal area, on avg 3 times per week, rated at a 10, some response to ibuprofen, blurry vision, no nausea, no weakness or numbness, has eye exam next week, aggravated by light, sleep offers relief . Pain is disabling at times, this is a new headache for her, denies head trauma, chronic stress unchanged, no previous similar headache, no known family  h/o migraine. States she recently checked her bP , this was high , with a diastolic at times as high as 100, she on her own resumed once daily metoprolol, which had been prescribed in the past for palpitations. States she had been feeling "weird and lightheaded " at times, before starting th medication. Still continues to gain weight and is considering joining weight watchers, no regular exercise and diet still high in sweets and carbohydrates   Review of Systems Denies recent fever or chills. Denies sinus pressure, nasal congestion, ear pain or sore throat. Denies chest congestion, productive cough or wheezing. Denies chest pains, palpitations, paroxysmal nocturnal dyspnea, orthopnea and leg swelling Denies abdominal pain, nausea, vomiting,diarrhea or constipation.  Denies rectal bleeding or change in bowel movement. Denies dysuria, frequency, hesitancy or incontinence. Denies joint pain, swelling and limitation in mobility. Denies  seizure, numbness, or tingling. Reports ongoing mild depression and  Anxiety, not suicidal or homicidal, still not interested in counselling Denies skin break down or rash.        Objective:   Physical Exam Patient alert and oriented and in no Cardiopulmonary distress.  HEENT: No facial asymmetry, EOMI, no sinus tenderness, TM's clear, Oropharynx pink and moist.  Neck supple no adenopathy.  Chest: Clear to auscultation bilaterally.  CVS: S1, S2 no murmurs, no S3.  ABD: Soft non  tender. Bowel sounds normal.  Ext: No edema  MS: Adequate ROM spine, shoulders, hips and knees.  Skin: Intact, no ulcerations or rash noted.  Psych: Good eye contact, normal affect. Memory intact not anxious or depressed appearing.  CNS: CN 2-12 intact, power, tone and sensation normal throughout.Fundoscopy: no sign of hemorhage or exudate        Assessment & Plan:

## 2011-04-13 ENCOUNTER — Ambulatory Visit (HOSPITAL_COMMUNITY): Admission: RE | Admit: 2011-04-13 | Payer: BC Managed Care – PPO | Source: Ambulatory Visit

## 2011-04-26 ENCOUNTER — Telehealth: Payer: Self-pay | Admitting: Family Medicine

## 2011-04-26 NOTE — Telephone Encounter (Signed)
Called patient left message

## 2011-04-27 ENCOUNTER — Ambulatory Visit (INDEPENDENT_AMBULATORY_CARE_PROVIDER_SITE_OTHER): Payer: BC Managed Care – PPO | Admitting: Family Medicine

## 2011-04-27 ENCOUNTER — Encounter: Payer: Self-pay | Admitting: Family Medicine

## 2011-04-27 VITALS — BP 118/84 | HR 74 | Resp 16 | Ht <= 58 in | Wt 194.1 lb

## 2011-04-27 DIAGNOSIS — E669 Obesity, unspecified: Secondary | ICD-10-CM

## 2011-04-27 DIAGNOSIS — R220 Localized swelling, mass and lump, head: Secondary | ICD-10-CM

## 2011-04-27 DIAGNOSIS — I10 Essential (primary) hypertension: Secondary | ICD-10-CM

## 2011-04-27 DIAGNOSIS — K219 Gastro-esophageal reflux disease without esophagitis: Secondary | ICD-10-CM

## 2011-04-27 DIAGNOSIS — F411 Generalized anxiety disorder: Secondary | ICD-10-CM

## 2011-04-27 MED ORDER — CEPHALEXIN 500 MG PO CAPS: 500.0000 mg | ORAL_CAPSULE | Freq: Three times a day (TID) | ORAL | Status: AC

## 2011-04-27 MED ORDER — NYSTATIN 100000 UNIT/GM EX POWD: Freq: Two times a day (BID) | CUTANEOUS | Status: DC

## 2011-04-27 MED ORDER — CLOTRIMAZOLE-BETAMETHASONE 1-0.05 % EX CREA: TOPICAL_CREAM | Freq: Two times a day (BID) | CUTANEOUS | Status: DC | PRN

## 2011-04-27 MED ORDER — FLUCONAZOLE 150 MG PO TABS: 150.0000 mg | ORAL_TABLET | Freq: Once | ORAL | Status: AC

## 2011-04-27 NOTE — Progress Notes (Signed)
  Subjective:    Patient ID: [name] [name], female    DOB: June 22, 1971, 40 y.o.   MRN: [id number]  HPI 12 day h/o left facial swelling over cheeek, no trauma to same or weakness, however , feels numb, and pt concerned about possible droolong or tongue biting neither of which has occurred. No vision changes or other neurologic symptoms. She also states she has dental work which is past due, and though she denies fever or dental pain, concurs that this may indeed be a problem   Review of Systems Denies recent fever or chills. Denies sinus pressure, nasal congestion, ear pain or sore throat. Denies chest congestion, productive cough or wheezing. Denies chest pains, palpitations and leg swelling Denies abdominal pain, nausea, vomiting,diarrhea or constipation.   Denies dysuria, frequency, hesitancy or incontinence. Denies joint pain, swelling and limitation in mobility. Headaches have improved,  Denies depression,uncontrolled  anxiety or insomnia. Denies skin break down or rash.        Objective:   Physical Exam        Assessment & Plan:

## 2011-04-27 NOTE — Telephone Encounter (Signed)
MRI was ordered but in the meantime, since lastweek the side of her face has been swollen and she has been having a tingling on that side of her face also for the past week. She said it has happened before but the feeling has came back. I advised ER but she did not want to go. Wanted to come here so Lucio Edward is going to schedule her an OV

## 2011-04-27 NOTE — Patient Instructions (Addendum)
F/u as before.    I  Do see mild swelling of your left jaw and am unclear as to the reason.   There is likely some infection where you need dental work. Antibiotics are prescribed, and you need to contact your dentist for an exam asap   Call if problems continue or worsen.  LABWORK  NEEDS TO BE DONE BETWEEN 3 TO 7 DAYS BEFORE YOUR NEXT SCEDULED  VISIT.  THIS WILL IMPROVE THE QUALITY OF YOUR CARE.

## 2011-05-01 NOTE — Assessment & Plan Note (Signed)
New, associated with numbness, pt has dental work that is past due and which needs to be done, antibiotics prescribed, and she is to contact her dentist

## 2011-05-01 NOTE — Assessment & Plan Note (Signed)
Normotensive on medication which also help palpitations

## 2011-05-01 NOTE — Assessment & Plan Note (Signed)
Improved , controlled with xanax at night only, which helps with sleep also

## 2011-05-01 NOTE — Assessment & Plan Note (Signed)
Deteriorated. Patient re-educated about  the importance of commitment to a  minimum of 150 minutes of exercise per week. The importance of healthy food choices with portion control discussed. Encouraged to start a food diary, count calories and to consider  joining a support group. Sample diet sheets offered. Goals set by the patient for the next several months.    

## 2011-05-01 NOTE — Assessment & Plan Note (Signed)
Controlled, no change in medication  

## 2011-05-12 ENCOUNTER — Ambulatory Visit: Payer: BC Managed Care – PPO | Admitting: Family Medicine

## 2011-05-17 ENCOUNTER — Ambulatory Visit: Payer: BC Managed Care – PPO | Admitting: Family Medicine

## 2011-05-26 ENCOUNTER — Ambulatory Visit: Payer: BC Managed Care – PPO | Admitting: Family Medicine

## 2011-06-23 ENCOUNTER — Encounter: Payer: Self-pay | Admitting: Family Medicine

## 2011-06-28 ENCOUNTER — Telehealth: Payer: Self-pay | Admitting: Family Medicine

## 2011-06-29 ENCOUNTER — Ambulatory Visit: Payer: BC Managed Care – PPO | Admitting: Family Medicine

## 2011-06-30 ENCOUNTER — Encounter: Payer: Self-pay | Admitting: Family Medicine

## 2011-07-01 NOTE — Telephone Encounter (Signed)
Needs cbc, fasting chem 7 lipid, hba1c and tsh and vit D ordered pls

## 2011-07-05 ENCOUNTER — Encounter: Payer: Self-pay | Admitting: Family Medicine

## 2011-07-05 ENCOUNTER — Ambulatory Visit (INDEPENDENT_AMBULATORY_CARE_PROVIDER_SITE_OTHER): Payer: BC Managed Care – PPO | Admitting: Family Medicine

## 2011-07-05 VITALS — BP 110/70 | HR 84 | Resp 16

## 2011-07-05 DIAGNOSIS — F411 Generalized anxiety disorder: Secondary | ICD-10-CM

## 2011-07-05 DIAGNOSIS — M899 Disorder of bone, unspecified: Secondary | ICD-10-CM

## 2011-07-05 DIAGNOSIS — I10 Essential (primary) hypertension: Secondary | ICD-10-CM

## 2011-07-05 DIAGNOSIS — R5383 Other fatigue: Secondary | ICD-10-CM

## 2011-07-05 DIAGNOSIS — E669 Obesity, unspecified: Secondary | ICD-10-CM

## 2011-07-05 DIAGNOSIS — Z23 Encounter for immunization: Secondary | ICD-10-CM

## 2011-07-05 DIAGNOSIS — Z79899 Other long term (current) drug therapy: Secondary | ICD-10-CM

## 2011-07-05 DIAGNOSIS — R7301 Impaired fasting glucose: Secondary | ICD-10-CM

## 2011-07-05 DIAGNOSIS — M949 Disorder of cartilage, unspecified: Secondary | ICD-10-CM

## 2011-07-05 DIAGNOSIS — R5381 Other malaise: Secondary | ICD-10-CM

## 2011-07-05 MED ORDER — AMLODIPINE BESYLATE 2.5 MG PO TABS: 2.5000 mg | ORAL_TABLET | Freq: Every day | ORAL | Status: DC

## 2011-07-05 MED ORDER — ALPRAZOLAM 0.5 MG PO TABS: 0.5000 mg | ORAL_TABLET | Freq: Every evening | ORAL | Status: DC | PRN

## 2011-07-05 NOTE — Patient Instructions (Addendum)
F/u in 3 months.  Fasting labs asap.  Mammogram past  Due pls schedule  TdaP today

## 2011-07-06 NOTE — Progress Notes (Signed)
  Subjective:    Patient ID: [name] [name], female    DOB: 1970/12/08, 40 y.o.   MRN: [id number]  HPI The PT is here for follow up and re-evaluation of chronic medical conditions, medication management and review of any available recent lab and radiology data.  Preventive health is updated, specifically  Cancer screening and Immunization.   Questions or concerns regarding consultations or procedures which the PT has had in the interim are  addressed. C/o excessive fatigue with metoprolol, she has discontinue this in the past 2 days, states she can handle the palpitations if they occur, wants alternative anti hypertensive. There are no new concerns.Continued stress, espescialy financial, no regular exercise, unchanged eating habits and continued weight gain. Considering weight watchers. Needs labs      Review of Systems See HPI Denies recent fever or chills. Denies sinus pressure, nasal congestion, ear pain or sore throat. Denies chest congestion, productive cough or wheezing. Denies chest pains, palpitations and leg swelling Denies abdominal pain, nausea, vomiting,diarrhea or constipation.   Denies dysuria, frequency, hesitancy or incontinence. Denies joint pain, swelling and limitation in mobility. Denies headaches, seizures, numbness, or tingling. Denies depression, anxiety or insomnia. Denies skin break down or rash.        Objective:   Physical Exam Patient alert and oriented and in no cardiopulmonary distress.  HEENT: No facial asymmetry, EOMI, no sinus tenderness,  oropharynx pink and moist.  Neck supple no adenopathy.  Chest: Clear to auscultation bilaterally.  CVS: S1, S2 no murmurs, no S3.  ABD: Soft non tender. Bowel sounds normal.  Ext: No edema  MS: Adequate ROM spine, shoulders, hips and knees.  Skin: Intact, no ulcerations or rash noted.  Psych: Good eye contact, normal affect. Memory intact not anxious or depressed appearing.  CNS: CN 2-12 intact,  power, tone and sensation normal throughout.        Assessment & Plan:

## 2011-07-06 NOTE — Telephone Encounter (Signed)
Patient was seen yesterday.

## 2011-07-06 NOTE — Assessment & Plan Note (Signed)
Deteriorated. Patient re-educated about  the importance of commitment to a  minimum of 150 minutes of exercise per week. The importance of healthy food choices with portion control discussed. Encouraged to start a food diary, count calories and to consider  joining a support group. Sample diet sheets offered. Goals set by the patient for the next several months.   Pt refused to weigh at visit

## 2011-07-06 NOTE — Telephone Encounter (Signed)
Labs ordered 10/16

## 2011-07-06 NOTE — Assessment & Plan Note (Signed)
deteriorated 

## 2011-07-06 NOTE — Assessment & Plan Note (Signed)
Unchanged, continue xanax as before 

## 2011-07-06 NOTE — Assessment & Plan Note (Signed)
Controlled however intolerant of medication , will change drug

## 2011-08-08 ENCOUNTER — Encounter: Payer: Self-pay | Admitting: Family Medicine

## 2011-08-09 ENCOUNTER — Ambulatory Visit: Payer: BC Managed Care – PPO | Admitting: Family Medicine

## 2011-08-22 ENCOUNTER — Other Ambulatory Visit: Payer: Self-pay | Admitting: Family Medicine

## 2011-08-22 DIAGNOSIS — Z139 Encounter for screening, unspecified: Secondary | ICD-10-CM

## 2011-08-26 ENCOUNTER — Ambulatory Visit (HOSPITAL_COMMUNITY)
Admission: RE | Admit: 2011-08-26 | Discharge: 2011-08-26 | Disposition: A | Discharge status: Home / Self Care | Payer: BC Managed Care – PPO | Source: Ambulatory Visit | Attending: Family Medicine | Admitting: Family Medicine

## 2011-08-26 DIAGNOSIS — Z1231 Encounter for screening mammogram for malignant neoplasm of breast: Secondary | ICD-10-CM | POA: Insufficient documentation

## 2011-08-26 DIAGNOSIS — Z139 Encounter for screening, unspecified: Secondary | ICD-10-CM

## 2011-08-26 LAB — CBC WITH DIFFERENTIAL/PLATELET
Eosinophils Relative: 2 % (ref 0–5)
HCT: 40.8 % (ref 36.0–46.0)
Hemoglobin: 13.9 g/dL (ref 12.0–15.0)
Lymphocytes Relative: 33 % (ref 12–46)
Lymphs Abs: 2.3 10*3/uL (ref 0.7–4.0)
MCV: 89.9 fL (ref 78.0–100.0)
Monocytes Absolute: 0.3 10*3/uL (ref 0.1–1.0)
Monocytes Relative: 5 % (ref 3–12)
Neutro Abs: 4.2 10*3/uL (ref 1.7–7.7)
RBC: 4.54 MIL/uL (ref 3.87–5.11)
RDW: 13.2 % (ref 11.5–15.5)
WBC: 7 10*3/uL (ref 4.0–10.5)

## 2011-08-26 LAB — HEPATIC FUNCTION PANEL
AST: 15 U/L (ref 0–37)
Albumin: 4 g/dL (ref 3.5–5.2)
Alkaline Phosphatase: 77 U/L (ref 39–117)
Indirect Bilirubin: 0.3 mg/dL (ref 0.0–0.9)
Total Protein: 7 g/dL (ref 6.0–8.3)

## 2011-08-26 LAB — BASIC METABOLIC PANEL
CO2: 25 mEq/L (ref 19–32)
Chloride: 105 mEq/L (ref 96–112)
Glucose, Bld: 78 mg/dL (ref 70–99)
Potassium: 4.4 mEq/L (ref 3.5–5.3)
Sodium: 137 mEq/L (ref 135–145)

## 2011-08-26 LAB — LIPID PANEL: LDL Cholesterol: 92 mg/dL (ref 0–99)

## 2011-08-27 LAB — VITAMIN D 25 HYDROXY (VIT D DEFICIENCY, FRACTURES): Vit D, 25-Hydroxy: 34 ng/mL (ref 30–89)

## 2011-08-27 LAB — HEMOGLOBIN A1C
Hgb A1c MFr Bld: 5.4 % (ref <5.7)
Mean Plasma Glucose: 108 mg/dL (ref <117)

## 2011-09-19 ENCOUNTER — Encounter: Payer: Self-pay | Admitting: Family Medicine

## 2011-10-05 ENCOUNTER — Ambulatory Visit: Payer: BC Managed Care – PPO | Admitting: Family Medicine

## 2011-11-01 ENCOUNTER — Ambulatory Visit: Payer: BC Managed Care – PPO | Admitting: Family Medicine

## 2011-11-03 ENCOUNTER — Ambulatory Visit (INDEPENDENT_AMBULATORY_CARE_PROVIDER_SITE_OTHER): Payer: BC Managed Care – PPO | Admitting: Family Medicine

## 2011-11-03 ENCOUNTER — Encounter: Payer: Self-pay | Admitting: Family Medicine

## 2011-11-03 VITALS — BP 120/84 | HR 74 | Resp 16 | Ht <= 58 in | Wt 199.0 lb

## 2011-11-03 DIAGNOSIS — E669 Obesity, unspecified: Secondary | ICD-10-CM

## 2011-11-03 DIAGNOSIS — F3289 Other specified depressive episodes: Secondary | ICD-10-CM

## 2011-11-03 DIAGNOSIS — R002 Palpitations: Secondary | ICD-10-CM

## 2011-11-03 DIAGNOSIS — R0609 Other forms of dyspnea: Secondary | ICD-10-CM | POA: Insufficient documentation

## 2011-11-03 DIAGNOSIS — R0989 Other specified symptoms and signs involving the circulatory and respiratory systems: Secondary | ICD-10-CM

## 2011-11-03 DIAGNOSIS — I10 Essential (primary) hypertension: Secondary | ICD-10-CM

## 2011-11-03 DIAGNOSIS — F329 Major depressive disorder, single episode, unspecified: Secondary | ICD-10-CM

## 2011-11-03 DIAGNOSIS — F411 Generalized anxiety disorder: Secondary | ICD-10-CM

## 2011-11-03 MED ORDER — ALPRAZOLAM 1 MG PO TABS: 1.0000 mg | ORAL_TABLET | Freq: Every evening | ORAL | Status: DC | PRN

## 2011-11-03 MED ORDER — AMLODIPINE BESYLATE 2.5 MG PO TABS: 2.5000 mg | ORAL_TABLET | Freq: Every day | ORAL | Status: DC

## 2011-11-03 NOTE — Patient Instructions (Addendum)
F/u in 4 months  Labs are excellent, blood sugar has improved.  Dose increase on xanax.  Aim for 8 hrs sleep.  Plan to exercise every day  You are referred for a chest CT scan and we are getting an appt with cardiology for you

## 2011-11-04 NOTE — Assessment & Plan Note (Signed)
No current palpitations, pt has discontinued beta blocker for months, made her "feel badly"

## 2011-11-04 NOTE — Assessment & Plan Note (Signed)
Deteriorated inc dose xanax

## 2011-11-04 NOTE — Assessment & Plan Note (Signed)
Deteriorated. Patient re-educated about  the importance of commitment to a  minimum of 150 minutes of exercise per week. The importance of healthy food choices with portion control discussed. Encouraged to start a food diary, count calories and to consider  joining a support group. Sample diet sheets offered. Goals set by the patient for the next several months.    

## 2011-11-04 NOTE — Assessment & Plan Note (Signed)
Untreated with med, however I do believe pt would benefit both from medication and therapy. Currently unwilling to pursue either, I have encouraged commitment to regular exercise

## 2011-11-04 NOTE — Assessment & Plan Note (Signed)
Controlled, no change in medication  

## 2011-11-04 NOTE — Assessment & Plan Note (Signed)
New and progressive per pt report in the past 2 to 3 weeks , chest ct to further eval

## 2011-11-04 NOTE — Progress Notes (Signed)
  Subjective:    Patient ID: [name] [name], female    DOB: [birth year], 41 y.o.   MRN: [id number]  HPI The PT is here for follow up and re-evaluation of chronic medical conditions, medication management and review of any available recent lab and radiology data.  Preventive health is updated, specifically  Cancer screening and Immunization.   C/o 3 week h/o increased exertional fatigue with minimal activity. Has been called for f/u with cardiology but has not scheduled the visit. She denies associated palpitations, nausea, light headedness or chest pain. Still has no control over her eating and continues to gain weight. Significant psychological underlay attributing to the weight issue, having increased difficulty with sleep    Review of Systems See HPI Denies recent fever or chills. Denies sinus pressure, nasal congestion, ear pain or sore throat. Denies chest congestion, productive cough or wheezing. Denies chest pains, palpitations and leg swelling Denies abdominal pain, nausea, vomiting,diarrhea or constipation.   Denies dysuria, frequency, hesitancy or incontinence. Denies joint pain, swelling and limitation in mobility. Denies headaches, seizures, numbness, or tingling.  Denies skin break down or rash.        Objective:   Physical Exam Patient alert and oriented and in no cardiopulmonary distress.  HEENT: No facial asymmetry, EOMI, no sinus tenderness,  oropharynx pink and moist.  Neck supple no adenopathy.  Chest: Clear to auscultation bilaterally. Adequate air entry, no crackles or wheezes  CVS: S1, S2 no murmurs, no S3.  ABD: Soft non tender. Bowel sounds normal.  Ext: No edema  MS: Adequate ROM spine, shoulders, hips and knees.  Skin: Intact, no ulcerations or rash noted.  Psych: Good eye contact, normal affect. Memory intact not anxious or depressed appearing.  CNS: CN 2-12 intact, power, tone and sensation normal throughout.        Assessment & Plan:

## 2011-11-15 ENCOUNTER — Telehealth: Payer: Self-pay | Admitting: Family Medicine

## 2011-11-15 NOTE — Telephone Encounter (Signed)
pls ordewr creatinine, htn dx is k, leave with front staff/Luann for her to collect

## 2011-11-16 ENCOUNTER — Telehealth: Payer: Self-pay | Admitting: Family Medicine

## 2011-11-16 ENCOUNTER — Other Ambulatory Visit: Payer: Self-pay

## 2011-11-16 DIAGNOSIS — I10 Essential (primary) hypertension: Secondary | ICD-10-CM

## 2011-11-16 NOTE — Telephone Encounter (Signed)
Labs ordered and awaiting pickup by pt.

## 2011-11-18 ENCOUNTER — Ambulatory Visit (HOSPITAL_COMMUNITY): Payer: BC Managed Care – PPO

## 2011-12-13 NOTE — Telephone Encounter (Signed)
Patient will call back if she wants this done

## 2012-02-23 ENCOUNTER — Encounter: Payer: Self-pay | Admitting: Family Medicine

## 2012-02-23 ENCOUNTER — Ambulatory Visit (INDEPENDENT_AMBULATORY_CARE_PROVIDER_SITE_OTHER): Payer: BC Managed Care – PPO | Admitting: Family Medicine

## 2012-02-23 VITALS — BP 110/80 | HR 79 | Resp 16 | Ht <= 58 in | Wt 207.0 lb

## 2012-02-23 DIAGNOSIS — I10 Essential (primary) hypertension: Secondary | ICD-10-CM

## 2012-02-23 DIAGNOSIS — R5381 Other malaise: Secondary | ICD-10-CM

## 2012-02-23 DIAGNOSIS — J301 Allergic rhinitis due to pollen: Secondary | ICD-10-CM

## 2012-02-23 DIAGNOSIS — K219 Gastro-esophageal reflux disease without esophagitis: Secondary | ICD-10-CM

## 2012-02-23 DIAGNOSIS — F411 Generalized anxiety disorder: Secondary | ICD-10-CM

## 2012-02-23 DIAGNOSIS — E669 Obesity, unspecified: Secondary | ICD-10-CM

## 2012-02-23 MED ORDER — PHENTERMINE HCL 37.5 MG PO TABS: 37.5000 mg | ORAL_TABLET | Freq: Every day | ORAL | Status: DC

## 2012-02-23 MED ORDER — ALPRAZOLAM 1 MG PO TABS: 1.0000 mg | ORAL_TABLET | Freq: Every evening | ORAL | Status: DC | PRN

## 2012-02-23 MED ORDER — AMLODIPINE BESYLATE 2.5 MG PO TABS: 2.5000 mg | ORAL_TABLET | Freq: Every day | ORAL | Status: DC

## 2012-02-23 NOTE — Progress Notes (Signed)
  Subjective:    Patient ID: [name] [name], female    DOB: 07-05-1971, 41 y.o.   MRN: [id number]  HPI The PT is here for follow up and re-evaluation of chronic medical conditions, medication management and review of any available recent lab and radiology data.  Preventive health is updated, specifically  Cancer screening and Immunization.   Questions or concerns regarding consultations or procedures which the PT has had in the interim are  Addressed.She has planned BTL in the next month The PT denies any adverse reactions to current medications since the last visit.  There are no new concerns.She is extremely frustrated with ongoing weight gain, wants to resume phentermine and will start water aerobics  There are no specific complaints       Review of Systems See HPI Denies recent fever or chills. Denies sinus pressure, nasal congestion, ear pain or sore throat. Denies chest congestion, productive cough or wheezing. Denies chest pains, palpitations and leg swelling Denies abdominal pain, nausea, vomiting,diarrhea or constipation.   Denies dysuria, frequency, hesitancy or incontinence. Denies joint pain, swelling and limitation in mobility. Denies headaches, seizures, numbness, or tingling. Denies depression, anxiety or insomnia. Denies skin break down or rash.        Objective:   Physical Exam Patient alert and oriented and in no cardiopulmonary distress.  HEENT: No facial asymmetry, EOMI, no sinus tenderness,  oropharynx pink and moist.  Neck supple no adenopathy.  Chest: Clear to auscultation bilaterally.  CVS: S1, S2 no murmurs, no S3.  ABD: Soft non tender. Bowel sounds normal.  Ext: No edema  MS: Adequate ROM spine, shoulders, hips and knees.  Skin: Intact, no ulcerations or rash noted.  Psych: Good eye contact, normal affect. Memory intact not anxious or depressed appearing.  CNS: CN 2-12 intact, power, tone and sensation normal throughout.          Assessment & Plan:

## 2012-02-23 NOTE — Patient Instructions (Signed)
F/U in 4 month  TSH, HBA1c today   All the best with upcoming surgery.  We will refer you to nutritionist  It is important that you exercise regularly at least 30 minutes 5 times a week. If you develop chest pain, have severe difficulty breathing, or feel very tired, stop exercising immediately and seek medical attention    A healthy diet is rich in fruit, vegetables and whole grains. Poultry fish, nuts and beans are a healthy choice for protein rather then red meat. A low sodium diet and drinking 64 ounces of water daily is generally recommended. Oils and sweet should be limited. Carbohydrates especially for those who are diabetic or overweight, should be limited to 30-45 gram per meal. It is important to eat on a regular schedule, at least 3 times daily. Snacks should be primarily fruits, vegetables or nuts.  Start phentermine half every morning  Meds will be refilled for 4 month

## 2012-02-24 ENCOUNTER — Telehealth (HOSPITAL_COMMUNITY): Payer: Self-pay | Admitting: Dietician

## 2012-02-24 NOTE — Telephone Encounter (Signed)
Sent appointment verification letter and instructions for appointment scheduled for 02/29/12 at 11:00 AM to pt home via [place] [employer].

## 2012-02-24 NOTE — Telephone Encounter (Signed)
Received referral from Dr. Simpson ([facility]) for dx: obesity.  

## 2012-02-24 NOTE — Telephone Encounter (Signed)
Appointment scheduled for 02/29/12 at 11:00 AM.

## 2012-02-26 NOTE — Assessment & Plan Note (Signed)
Deteriorated. Patient re-educated about  the importance of commitment to a  minimum of 150 minutes of exercise per week. The importance of healthy food choices with portion control discussed. Encouraged to start a food diary, count calories and to consider  joining a support group. Sample diet sheets offered. Goals set by the patient for the next several months.    

## 2012-02-26 NOTE — Assessment & Plan Note (Signed)
Currently on no medication and symptoms are controlled

## 2012-02-26 NOTE — Assessment & Plan Note (Signed)
Controlled, no change in medication  

## 2012-02-26 NOTE — Assessment & Plan Note (Signed)
Unchanged, still dependent on xanax, and eating disorder likely related to uncontrolled anxiety

## 2012-02-29 ENCOUNTER — Telehealth (HOSPITAL_COMMUNITY): Payer: Self-pay | Admitting: Dietician

## 2012-02-29 ENCOUNTER — Encounter (HOSPITAL_COMMUNITY): Payer: Self-pay | Admitting: Dietician

## 2012-02-29 NOTE — Telephone Encounter (Signed)
Pt was a no-show for appointment scheduled for 02/29/12 at 11:00 AM. Sent letter via [place] [employer] notifying pt of no-show and requesting rescheduling appointment.

## 2012-02-29 NOTE — Progress Notes (Signed)
Follow-Up Outpatient Nutrition Note Date: 02/29/2012  Time: 11:40 AM  Pt arrived 40 minutes late to appointment. Pt was worked in.   Nutrition Assessment:  Current weight: Weight: 204 lb (92.534 kg)  BMI: Body mass index is 42.64 kg/(m^2).  Weight changes: +6 (2.9%) x 6.5 years  Pt desires weight loss. She reports that this is the first time she has been over 200# and wants to make changes toward a healthy lifestyle. Her last RD appointment was 10/21/05, where she weighed 198#. Pt reports that she had gained 10# between her last two PCP visits. She reports that she most recently lost 30# in 2009 by following the "Let's Eat Lunch" diet, which pt explained mainly substituted fruits and vegetables for other high carbohydrate, high sugar snack foods.  She reports that stress is a huge barrier to her implementing lifestyle change. She rates her average stress level at 5, citing being a single parent as her main source of stress. She reports that she purchases healthy foods, such as whole grains, fruits, vegetables, and lean meats, but struggles when she "does not have a plan" and when she eats outside of the home. She reports she does not buy foods such as sodas and candy. 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 She is contemplating starting to walk and reports that she is going to walk up her stairs while she is doing her laundry.  She also is interested in knowing how much water she she drink per day.   Labs: CMP     Component Value Date/Time   NA 137 07/05/2011 1722   K 4.4 07/05/2011 1722   CL 105 07/05/2011 1722   CO2 25 07/05/2011 1722   GLUCOSE 78 07/05/2011 1722   BUN 11 07/05/2011 1722   CREATININE 0.80 07/05/2011 1722   CREATININE 0.82 07/13/2010 2303   CALCIUM 9.0 07/05/2011 1722   PROT 7.0 07/05/2011 1722   ALBUMIN 4.0 07/05/2011 1722   AST 15 07/05/2011 1722   ALT 10 07/05/2011 1722   ALKPHOS 77 07/05/2011 1722   BILITOT 0.4 07/05/2011 1722    Lipid Panel     Component Value Date/Time   CHOL 164 07/05/2011 1722   TRIG 47 07/05/2011 1722   HDL 63 07/05/2011 1722   CHOLHDL 2.6 07/05/2011 1722   VLDL 9 07/05/2011 1722   LDLCALC 92 07/05/2011 1722     Lab Results  Component Value Date   HGBA1C 5.4 07/05/2011   HGBA1C 5.6 07/13/2010   Lab Results  Component Value Date   LDLCALC 92 07/05/2011   CREATININE 0.80 07/05/2011    Nutrition Diagnosis:  Nutrition Intervention: Nutrition rx: 1500 kcal NAS, no sugar added diet; 3 meals per day; no snacking; low calorie beverages only; 30 minutes physical activity 5 times per week  Education/ counseling provided: The majority of the visit was spent on counseling strategies to assist with behavioral and lifestyle change. Discussed slow, moderate weight loss of 1-2# per week. Discussed meal planning strategies and ways to make an eating plan on hectic day or when she is going out. Discussed healthier choices at restaurants. Discussed importance of drinking low calorie beverages and importance of adequate hydration, especially when in the heat or exercising. Discussed making small, reasonable goals that she can focus very easily on. Also showed pt functionality of MyFitnessPal app.  Understanding/Motivation/ Ability to follow recommendations: Expect fair to good compliance.  Monitoring and Evaluation: Goals for next visit: 1) 1-2# weight loss per week; 2) 30 minutes physical activity 5 times per week  Recommendations: 1) For weight loss: 1284-1446 kcals daily; 2) Keep food diary (ex. MyFitnessPal); 3) Drink 64 oz of fluid daily (based on estimated fluid needs) but consume more when exercising, sweating, or in hot or humid weather to prevent dehydration; 4) Break up exercise into smaller, more frequent sessions  F/U: 1-2 months. Follow-up scheduled for 04/18/12 at 11:00 AM.   Melody Haver, RD, LDN Date:02/29/2012 Time:  11:40 AM

## 2012-03-06 ENCOUNTER — Ambulatory Visit: Payer: BC Managed Care – PPO | Admitting: Family Medicine

## 2012-03-19 ENCOUNTER — Other Ambulatory Visit: Payer: Self-pay | Admitting: Family Medicine

## 2012-03-21 ENCOUNTER — Telehealth: Payer: Self-pay | Admitting: Family Medicine

## 2012-03-23 NOTE — Telephone Encounter (Signed)
Med called in

## 2012-03-25 ENCOUNTER — Encounter (HOSPITAL_COMMUNITY): Payer: Self-pay | Admitting: Pharmacist

## 2012-03-26 ENCOUNTER — Inpatient Hospital Stay (HOSPITAL_COMMUNITY): Admission: RE | Admit: 2012-03-26 | Discharge: 2012-03-26 | Payer: BC Managed Care – PPO | Source: Ambulatory Visit

## 2012-03-26 NOTE — Patient Instructions (Addendum)
   Your procedure is scheduled on: Monday July 15th  Enter through the Main Entrance of [facility] at: 1:30 pm Pick up the phone at the desk and dial [phone] and inform us of your arrival.  Please call this number if you have any problems the morning of surgery: [phone]  Remember: Do not eat food after midnight: Sunday Do not drink clear liquids after: Monday at 9am Take these medicines the morning of surgery with a SIP OF WATER:  Do not wear jewelry, make-up, or FINGER nail polish No metal in your hair or on your body. Do not wear lotions, powders, perfumes or deodorant. Do not shave 48 hours prior to surgery. Do not bring valuables to the hospital. Contacts, dentures or bridgework may not be worn into surgery.    Patients discharged on the day of surgery will not be allowed to drive home.     Remember to use your hibiclens as instructed.Please shower with 1/2 bottle the evening before your surgery and the other 1/2 bottle the morning of surgery. Neck down avoiding private area.

## 2012-03-29 ENCOUNTER — Encounter (HOSPITAL_COMMUNITY)
Admission: RE | Admit: 2012-03-29 | Discharge: 2012-03-29 | Disposition: A | Discharge status: Home / Self Care | Payer: BC Managed Care – PPO | Source: Ambulatory Visit | Attending: Obstetrics and Gynecology | Admitting: Obstetrics and Gynecology

## 2012-03-29 ENCOUNTER — Inpatient Hospital Stay (HOSPITAL_COMMUNITY): Admission: RE | Admit: 2012-03-29 | Payer: BC Managed Care – PPO | Source: Ambulatory Visit

## 2012-03-29 ENCOUNTER — Encounter (HOSPITAL_COMMUNITY): Payer: Self-pay

## 2012-03-29 HISTORY — DX: Essential (primary) hypertension: I10

## 2012-03-29 HISTORY — DX: Nausea with vomiting, unspecified: R11.2

## 2012-03-29 HISTORY — DX: Other specified postprocedural states: Z98.890

## 2012-03-29 LAB — SURGICAL PCR SCREEN: Staphylococcus aureus: POSITIVE — AB

## 2012-03-29 LAB — CBC
Hemoglobin: 13.6 g/dL (ref 12.0–15.0)
MCH: 29.5 pg (ref 26.0–34.0)
MCV: 88.9 fL (ref 78.0–100.0)
Platelets: 310 10*3/uL (ref 150–400)
RBC: 4.61 MIL/uL (ref 3.87–5.11)
WBC: 6.8 10*3/uL (ref 4.0–10.5)

## 2012-03-29 LAB — BASIC METABOLIC PANEL
CO2: 26 mEq/L (ref 19–32)
Glucose, Bld: 77 mg/dL (ref 70–99)
Potassium: 4.1 mEq/L (ref 3.5–5.1)
Sodium: 136 mEq/L (ref 135–145)

## 2012-03-29 NOTE — Patient Instructions (Addendum)
19 [name] [name]  03/29/2012   Your procedure is scheduled on:  04/02/12  Enter through the Main Entrance of [facility] at 1130 AM.  Pick up the phone at the desk and dial [phone].   Call this number if you have problems the morning of surgery: [phone]   Remember:   Do not eat food:After Midnight.  Do not drink clear liquids: After Midnight.  Take these medicines the morning of surgery with A SIP OF WATER: Norvasc and Previcid. May take Xanax if needed   Do not wear jewelry, make-up or nail polish.  Do not wear lotions, powders, or perfumes. You may wear deodorant.  Do not shave 48 hours prior to surgery.  Do not bring valuables to the hospital.  Contacts, dentures or bridgework may not be worn into surgery.  Leave suitcase in the car. After surgery it may be brought to your room.  For patients admitted to the hospital, checkout time is 11:00 AM the day of discharge.   Patients discharged the day of surgery will not be allowed to drive home.  Name and phone number of your driver: Caro Hight    mother  Special Instructions: CHG Shower Use Special Wash: 1/2 bottle night before surgery and 1/2 bottle morning of surgery.   Please read over the following fact sheets that you were given: MRSA Information

## 2012-03-29 NOTE — Pre-Procedure Instructions (Signed)
Pt states she has not taken Phentermine since June 10th (2013).

## 2012-03-30 ENCOUNTER — Other Ambulatory Visit: Payer: Self-pay | Admitting: Obstetrics and Gynecology

## 2012-03-30 NOTE — Pre-Procedure Instructions (Signed)
Cardiac testing results from [facility] reviewed and approved by Dr Cristela Blue. No further orders given.

## 2012-04-02 ENCOUNTER — Encounter (HOSPITAL_COMMUNITY): Payer: Self-pay | Admitting: Anesthesiology

## 2012-04-02 ENCOUNTER — Encounter (HOSPITAL_COMMUNITY)
Admission: RE | Disposition: A | Discharge status: Home / Self Care | Payer: Self-pay | Source: Ambulatory Visit | Attending: Obstetrics and Gynecology

## 2012-04-02 ENCOUNTER — Encounter (HOSPITAL_COMMUNITY): Payer: Self-pay | Admitting: *Deleted

## 2012-04-02 ENCOUNTER — Ambulatory Visit (HOSPITAL_COMMUNITY): Payer: BC Managed Care – PPO | Admitting: Anesthesiology

## 2012-04-02 ENCOUNTER — Ambulatory Visit (HOSPITAL_COMMUNITY)
Admission: RE | Admit: 2012-04-02 | Discharge: 2012-04-02 | Disposition: A | Discharge status: Home / Self Care | Payer: BC Managed Care – PPO | Source: Ambulatory Visit | Attending: Obstetrics and Gynecology | Admitting: Obstetrics and Gynecology

## 2012-04-02 DIAGNOSIS — Z01812 Encounter for preprocedural laboratory examination: Secondary | ICD-10-CM | POA: Insufficient documentation

## 2012-04-02 DIAGNOSIS — N920 Excessive and frequent menstruation with regular cycle: Secondary | ICD-10-CM | POA: Insufficient documentation

## 2012-04-02 DIAGNOSIS — Z01818 Encounter for other preprocedural examination: Secondary | ICD-10-CM | POA: Insufficient documentation

## 2012-04-02 DIAGNOSIS — Z9851 Tubal ligation status: Secondary | ICD-10-CM

## 2012-04-02 DIAGNOSIS — Z302 Encounter for sterilization: Secondary | ICD-10-CM | POA: Insufficient documentation

## 2012-04-02 HISTORY — PX: HYSTEROSCOPY WITH D & C: SHX1775

## 2012-04-02 HISTORY — PX: LAPAROSCOPIC TUBAL LIGATION: SHX1937

## 2012-04-02 LAB — PREGNANCY, URINE: Preg Test, Ur: NEGATIVE

## 2012-04-02 SURGERY — LIGATION, FALLOPIAN TUBE, LAPAROSCOPIC
Anesthesia: General | Site: Vagina | Wound class: Clean Contaminated

## 2012-04-02 MED ORDER — DEXAMETHASONE SODIUM PHOSPHATE 10 MG/ML IJ SOLN
INTRAMUSCULAR | Status: AC
  Filled 2012-04-02: qty 1

## 2012-04-02 MED ORDER — BUPIVACAINE HCL (PF) 0.25 % IJ SOLN
INTRAMUSCULAR | Status: DC | PRN
  Administered 2012-04-02: 5 mL
  Administered 2012-04-02: 2 mL

## 2012-04-02 MED ORDER — CHLOROPROCAINE HCL 1 % IJ SOLN
INTRAMUSCULAR | Status: DC | PRN
  Administered 2012-04-02: 20 mL

## 2012-04-02 MED ORDER — CHLOROPROCAINE HCL 1 % IJ SOLN
INTRAMUSCULAR | Status: AC
  Filled 2012-04-02: qty 30

## 2012-04-02 MED ORDER — PROPOFOL 10 MG/ML IV EMUL
INTRAVENOUS | Status: DC | PRN
  Administered 2012-04-02: 200 mg via INTRAVENOUS

## 2012-04-02 MED ORDER — FENTANYL CITRATE 0.05 MG/ML IJ SOLN
INTRAMUSCULAR | Status: DC | PRN
  Administered 2012-04-02 (×2): 50 ug via INTRAVENOUS
  Administered 2012-04-02: 100 ug via INTRAVENOUS
  Administered 2012-04-02: 50 ug via INTRAVENOUS

## 2012-04-02 MED ORDER — ROCURONIUM BROMIDE 100 MG/10ML IV SOLN
INTRAVENOUS | Status: DC | PRN
  Administered 2012-04-02: 5 mg via INTRAVENOUS
  Administered 2012-04-02: 35 mg via INTRAVENOUS

## 2012-04-02 MED ORDER — GLYCINE 1.5 % IR SOLN
Status: DC | PRN
  Administered 2012-04-02: 200 mL

## 2012-04-02 MED ORDER — BUPIVACAINE HCL (PF) 0.25 % IJ SOLN
INTRAMUSCULAR | Status: AC
  Filled 2012-04-02: qty 30

## 2012-04-02 MED ORDER — DEXAMETHASONE SODIUM PHOSPHATE 4 MG/ML IJ SOLN
INTRAMUSCULAR | Status: DC | PRN
  Administered 2012-04-02: 10 mg via INTRAVENOUS

## 2012-04-02 MED ORDER — FENTANYL CITRATE 0.05 MG/ML IJ SOLN: 25.0000 ug | INTRAMUSCULAR | Status: DC | PRN

## 2012-04-02 MED ORDER — PROPOFOL 10 MG/ML IV EMUL
INTRAVENOUS | Status: AC
  Filled 2012-04-02: qty 20

## 2012-04-02 MED ORDER — LACTATED RINGERS IV SOLN
INTRAVENOUS | Status: DC
  Administered 2012-04-02 (×2): via INTRAVENOUS

## 2012-04-02 MED ORDER — KETOROLAC TROMETHAMINE 30 MG/ML IJ SOLN: 15.0000 mg | Freq: Once | INTRAMUSCULAR | Status: DC | PRN

## 2012-04-02 MED ORDER — KETOROLAC TROMETHAMINE 60 MG/2ML IM SOLN
INTRAMUSCULAR | Status: AC
  Filled 2012-04-02: qty 2

## 2012-04-02 MED ORDER — ONDANSETRON HCL 4 MG/2ML IJ SOLN
INTRAMUSCULAR | Status: DC | PRN
  Administered 2012-04-02: 4 mg via INTRAVENOUS

## 2012-04-02 MED ORDER — LIDOCAINE HCL (CARDIAC) 20 MG/ML IV SOLN
INTRAVENOUS | Status: AC
  Filled 2012-04-02: qty 5

## 2012-04-02 MED ORDER — NEOSTIGMINE METHYLSULFATE 1 MG/ML IJ SOLN
INTRAMUSCULAR | Status: AC
  Filled 2012-04-02: qty 10

## 2012-04-02 MED ORDER — GLYCOPYRROLATE 0.2 MG/ML IJ SOLN
INTRAMUSCULAR | Status: AC
  Filled 2012-04-02: qty 1

## 2012-04-02 MED ORDER — KETOROLAC TROMETHAMINE 30 MG/ML IJ SOLN
INTRAMUSCULAR | Status: DC | PRN
  Administered 2012-04-02: 30 mg via INTRAMUSCULAR
  Administered 2012-04-02: 30 mg via INTRAVENOUS

## 2012-04-02 MED ORDER — MIDAZOLAM HCL 5 MG/5ML IJ SOLN
INTRAMUSCULAR | Status: DC | PRN
  Administered 2012-04-02: 2 mg via INTRAVENOUS

## 2012-04-02 MED ORDER — FENTANYL CITRATE 0.05 MG/ML IJ SOLN
INTRAMUSCULAR | Status: AC
  Filled 2012-04-02: qty 5

## 2012-04-02 MED ORDER — MIDAZOLAM HCL 2 MG/2ML IJ SOLN
INTRAMUSCULAR | Status: AC
  Filled 2012-04-02: qty 2

## 2012-04-02 MED ORDER — 0.9 % SODIUM CHLORIDE (POUR BTL) OPTIME
TOPICAL | Status: DC | PRN
  Administered 2012-04-02: 1000 mL

## 2012-04-02 MED ORDER — SODIUM CHLORIDE 0.9 % IJ SOLN
INTRAMUSCULAR | Status: DC | PRN
  Administered 2012-04-02: 3 mL

## 2012-04-02 MED ORDER — LIDOCAINE HCL (CARDIAC) 20 MG/ML IV SOLN
INTRAVENOUS | Status: DC | PRN
  Administered 2012-04-02: 30 mg via INTRAVENOUS
  Administered 2012-04-02: 50 mg via INTRAVENOUS

## 2012-04-02 MED ORDER — ONDANSETRON HCL 4 MG/2ML IJ SOLN
INTRAMUSCULAR | Status: AC
  Filled 2012-04-02: qty 2

## 2012-04-02 SURGICAL SUPPLY — 30 items
ADH SKN CLS LQ APL DERMABOND (GAUZE/BANDAGES/DRESSINGS) ×2
CANISTER SUCTION 2500CC (MISCELLANEOUS) ×3 IMPLANT
CATH ROBINSON RED A/P 16FR (CATHETERS) ×3 IMPLANT
CLOTH BEACON ORANGE TIMEOUT ST (SAFETY) ×3 IMPLANT
CONTAINER PREFILL 10% NBF 60ML (FORM) ×5 IMPLANT
COVER TABLE BACK 60X90 (DRAPES) ×1 IMPLANT
DERMABOND ADHESIVE PROPEN (GAUZE/BANDAGES/DRESSINGS) ×1
DERMABOND ADVANCED .7 DNX6 (GAUZE/BANDAGES/DRESSINGS) IMPLANT
ELECT REM PT RETURN 9FT ADLT (ELECTROSURGICAL) ×3
ELECTRODE REM PT RTRN 9FT ADLT (ELECTROSURGICAL) ×2 IMPLANT
GAUZE SPONGE 4X4 16PLY XRAY LF (GAUZE/BANDAGES/DRESSINGS) ×1 IMPLANT
GLOVE BIO SURGEON STRL SZ 6.5 (GLOVE) ×3 IMPLANT
GLOVE BIOGEL PI IND STRL 7.0 (GLOVE) ×4 IMPLANT
GLOVE BIOGEL PI INDICATOR 7.0 (GLOVE) ×2
GLYCINE 1.5% IRRIG UROMATIC (IV SOLUTION) ×1 IMPLANT
GOWN PREVENTION PLUS LG XLONG (DISPOSABLE) ×6 IMPLANT
GOWN STRL REIN XL XLG (GOWN DISPOSABLE) ×3 IMPLANT
LOOP ANGLED CUTTING 22FR (CUTTING LOOP) IMPLANT
NDL INSUFFLATION 14GA 120MM (NEEDLE) ×2 IMPLANT
NEEDLE INSUFFLATION 14GA 120MM (NEEDLE) IMPLANT
PACK HYSTEROSCOPY LF (CUSTOM PROCEDURE TRAY) ×2 IMPLANT
PACK LAPAROSCOPY BASIN (CUSTOM PROCEDURE TRAY) ×2 IMPLANT
PACK LAVH (CUSTOM PROCEDURE TRAY) ×1 IMPLANT
SUT VICRYL 0 UR6 27IN ABS (SUTURE) ×3 IMPLANT
SUT VICRYL 4-0 PS2 18IN ABS (SUTURE) ×3 IMPLANT
TOWEL OR 17X24 6PK STRL BLUE (TOWEL DISPOSABLE) ×6 IMPLANT
TROCAR Z-THREAD BLADED 11X100M (TROCAR) ×3 IMPLANT
TROCAR Z-THREAD BLADED 5X100MM (TROCAR) ×3 IMPLANT
TUBING HYDROFLEX HYSTEROSCOPY (TUBING) ×1 IMPLANT
WATER STERILE IRR 1000ML POUR (IV SOLUTION) ×3 IMPLANT

## 2012-04-02 NOTE — Brief Op Note (Signed)
04/02/2012  2:26 PM  PATIENT:  [name] [name]  41 y.o. female  PRE-OPERATIVE DIAGNOSIS:  Desires sterilization;menorrhagia  POST-OPERATIVE DIAGNOSIS:  Desires Sterilization; Menorrhagia  PROCEDURE:  Procedure(s) (LRB): LAPAROSCOPIC TUBAL LIGATION (Bilateral) WITH BIPOLAR CAUTERY,  DILATATION AND CURETTAGE  DIAGNOSTIC /HYSTEROSCOPY (N/A)  SURGEON:  Surgeon(s) and Role:    * Kimyata Milich Cathie Beams, MD - Primary  PHYSICIAN ASSISTANT:   ASSISTANTS: none   ANESTHESIA:   general  EBL:  Total I/O In: 1000 [I.V.:1000] Out: -   FINDINGS: NL TUBES, OVARIES, ANT ABD WALL ADHESIONS, OMENTAL ADHESION ANT ABD WALL, UTERINE SEROSA TO ANT ABD WALL  BLOOD ADMINISTERED:none  DRAINS: none   LOCAL MEDICATIONS USED:  MARCAINE    and OTHER NESICAINE  SPECIMEN:  Source of Specimen:  emc  DISPOSITION OF SPECIMEN:  PATHOLOGY  COUNTS:  YES  TOURNIQUET:  * No tourniquets in log *  DICTATION: .Other Dictation: Dictation Number   PLAN OF CARE: Discharge to home after PACU  PATIENT DISPOSITION:  PACU - hemodynamically stable.   Delay start of Pharmacological VTE agent (>24hrs) due to surgical blood loss or risk of bleeding: no

## 2012-04-02 NOTE — Transfer of Care (Signed)
Immediate Anesthesia Transfer of Care Note  Patient: [name] [name]  Procedure(s) Performed: Procedure(s) (LRB): LAPAROSCOPIC TUBAL LIGATION (Bilateral) DILATATION AND CURETTAGE /HYSTEROSCOPY (N/A)  Patient Location: PACU  Anesthesia Type: General  Level of Consciousness: sedated and patient cooperative  Airway & Oxygen Therapy: Patient Spontanous Breathing and Patient connected to nasal cannula oxygen  Post-op Assessment: Report given to PACU RN and Post -op Vital signs reviewed and stable  Post vital signs: Reviewed and stable  Complications: No apparent anesthesia complications

## 2012-04-02 NOTE — Anesthesia Preprocedure Evaluation (Signed)
Anesthesia Evaluation  Patient identified by MRN, date of birth, ID band Patient awake    Reviewed: Allergy & Precautions, H&P , NPO status , Patient's Chart, lab work & pertinent test results, reviewed documented beta blocker date and time   History of Anesthesia Complications (+) PONV  Airway Mallampati: III TM Distance: >3 FB Neck ROM: full    Dental  (+) Teeth Intact   Pulmonary neg pulmonary ROS,  breath sounds clear to auscultation  Pulmonary exam normal       Cardiovascular Exercise Tolerance: Good hypertension, On Medications + dysrhythmias (palpitations, recent echo (4/10) was normal) Rhythm:regular Rate:Normal     Neuro/Psych PSYCHIATRIC DISORDERS (anxiety) negative neurological ROS     GI/Hepatic Neg liver ROS, GERD-  Medicated and Controlled,  Endo/Other  Morbid obesity  Renal/GU   Female GU complaint     Musculoskeletal   Abdominal   Peds  Hematology negative hematology ROS (+)   Anesthesia Other Findings   Reproductive/Obstetrics negative OB ROS                           Anesthesia Physical Anesthesia Plan  ASA: III  Anesthesia Plan: General ETT   Post-op Pain Management:    Induction:   Airway Management Planned:   Additional Equipment:   Intra-op Plan:   Post-operative Plan:   Informed Consent: I have reviewed the patients History and Physical, chart, labs and discussed the procedure including the risks, benefits and alternatives for the proposed anesthesia with the patient or authorized representative who has indicated his/her understanding and acceptance.   Dental Advisory Given  Plan Discussed with: CRNA and Surgeon  Anesthesia Plan Comments:         Anesthesia Quick Evaluation

## 2012-04-02 NOTE — Anesthesia Procedure Notes (Signed)
Procedure Name: Intubation Date/Time: 04/02/2012 1:35 PM Performed by: Isabella Bowens R Pre-anesthesia Checklist: Patient identified, Patient being monitored, Emergency Drugs available, Timeout performed and Suction available Patient Re-evaluated:Patient Re-evaluated prior to inductionOxygen Delivery Method: Circle system utilized Preoxygenation: Pre-oxygenation with 100% oxygen Intubation Type: IV induction Ventilation: Mask ventilation without difficulty Laryngoscope Size: Mac and 3 Grade View: Grade III Tube type: Oral Tube size: 7.0 mm Number of attempts: 1 Airway Equipment and Method: Stylet Placement Confirmation: ETT inserted through vocal cords under direct vision,  positive ETCO2 and breath sounds checked- equal and bilateral Secured at: 20 cm Tube secured with: Tape Dental Injury: Teeth and Oropharynx as per pre-operative assessment  Difficulty Due To: Difficulty was anticipated and Difficult Airway- due to large tongue

## 2012-04-03 ENCOUNTER — Encounter (HOSPITAL_COMMUNITY): Payer: Self-pay | Admitting: Obstetrics and Gynecology

## 2012-04-03 NOTE — Op Note (Signed)
NAMELEITH, HEDLUND                  ACCOUNT NO.:  [id number]  MEDICAL RECORD NO.:  [id number]  LOCATION:  [facility]                          FACILITY:  [facility]  PHYSICIAN:  Maxie Better, M.D.DATE OF BIRTH:  1970/09/29  DATE OF PROCEDURE:  04/02/2012 DATE OF DISCHARGE:  04/02/2012                              OPERATIVE REPORT   PREOPERATIVE DIAGNOSES:  Desires sterilization, menorrhagia.  PROCEDURES:  Diagnostic laparoscopy, laparoscopic tubal ligation with bipolar cautery, diagnostic hysteroscopy, D and C.  POSTOPERATIVE DIAGNOSES:  Menorrhagia, desires sterilization, abdominopelvic adhesions.  ANESTHESIA:  General, paracervical block.  SURGEON:  Maxie Better, MD  ASSISTANT:  None.  PROCEDURE:  Under adequate general anesthesia, the patient was placed in a dorsal lithotomy position.  She was sterilely prepped and draped in usual fashion.  Bladder was catheterized and large amount of urine. Examination under anesthesia had revealed anteverted uterus.  No adnexal masses could be appreciated.  Bivalve speculum was placed in the vagina. Single-tooth tenaculum was placed in the anterior lip of the cervix.  20 mL of 1% Nesacaine was injected paracervically at the 3 and 9 o'clock positions.  Cervix was easily dilated up to a #25 Pratt dilator. Glycine-primed diagnostic hysteroscope was introduced into the uterine cavity.  Both tubal ostia could be seen.  The posterior wall of the endometrium had some thickening.  No discrete polyps or fibroids noted. The hysteroscope was removed.  The cavity was then curetted and the hysteroscope was then reinserted.  Adequate assessment of the cavity was then done.  The hysteroscope was removed.  Acorn cannula was introduced into the cervical os and attached to the tenaculum for manipulation of the uterus.  Bivalve speculum was then removed.  Attention was then turned to the abdomen where 0.25% Marcaine was injected infraumbilically.   Infraumbilical incision was then made.  Veress needle was introduced without incident and tested with normal saline.  Opening pressure of 7 was noted.  3 L of CO2 was insufflated.  Veress needle was then removed.  A 10-mm disposable trocar was then introduced into the abdomen without incident.  The lighted video laparoscope was then placed through that port at which time it was then noted that the omentum was adherent to the anterior abdominal wall circling around the omentum and no evidence of any trauma within the cavity.  The uterus was identified. There was adhesions in the midline of the uterine serosa to the anterior abdominal wall.  Both tubes and ovaries were noted to be normal.  There was some adhesions in the lower segment from the patient's previous surgery.  0.25% Marcaine was injected suprapubically and suprapubic incision was then made under direct visualization.  A 5-mm port was carefully placed.  Bipolar cautery was then used to cauterize the midportion of both fallopian tubes, which was done without incident. The adhesions were left in place.  With good bipolar cauterization performed, the procedure was then terminated by removing the suprapubic ports, deflating the abdomen, removing the infraumbilical site under direct visualization.  Those incisions were then closed with 4-0 Vicryl subcuticular stitch and 0-Vicryl for the fascia infraumbilical site. Instruments in the vagina was removed.  SPECIMENS:  Endometrial curetting  sent to Pathology.  ESTIMATED BLOOD LOSS:  Minimal.  FLUID DEFICIT:  50 mL.  COMPLICATIONS:  None.  The patient tolerated the procedure well, was transferred to the recovery room in stable condition.     Maxie Better, M.D.     [place]/MED[facility]  D:  04/02/2012  T:  04/03/2012  Job:  [id number]

## 2012-04-05 ENCOUNTER — Telehealth (HOSPITAL_COMMUNITY): Payer: Self-pay | Admitting: Dietician

## 2012-04-05 NOTE — Telephone Encounter (Signed)
Mailed appointment confirmation letter and instructions for appointment scheduled 04/18/12 at 11:00 AM via [place] [employer].

## 2012-04-05 NOTE — Anesthesia Postprocedure Evaluation (Signed)
  Anesthesia Post-op Note  Patient: [name] [name]  Procedure(s) Performed: Procedure(s) (LRB): LAPAROSCOPIC TUBAL LIGATION (Bilateral) DILATATION AND CURETTAGE /HYSTEROSCOPY (N/A) Patient is awake and responsive. Pain and nausea are reasonably well controlled. Vital signs are stable and clinically acceptable. Oxygen saturation is clinically acceptable. There are no apparent anesthetic complications at this time. Patient is ready for discharge.

## 2012-04-18 ENCOUNTER — Telehealth (HOSPITAL_COMMUNITY): Payer: Self-pay | Admitting: Dietician

## 2012-04-18 NOTE — Telephone Encounter (Signed)
Pt was a no-show for appointment scheduled for 04/18/2012 at 11:00 AM . Sent letter to pt home notifying pt of no-show and requesting rescheduling appointment.

## 2012-05-30 ENCOUNTER — Other Ambulatory Visit: Payer: Self-pay | Admitting: Family Medicine

## 2012-06-07 ENCOUNTER — Encounter: Payer: Self-pay | Admitting: Family Medicine

## 2012-06-07 ENCOUNTER — Ambulatory Visit (INDEPENDENT_AMBULATORY_CARE_PROVIDER_SITE_OTHER): Payer: BC Managed Care – PPO | Admitting: Family Medicine

## 2012-06-07 DIAGNOSIS — E669 Obesity, unspecified: Secondary | ICD-10-CM

## 2012-06-08 NOTE — Progress Notes (Signed)
  Subjective:    Patient ID: [name] [name], female    DOB: 1971-06-28, 41 y.o.   MRN: [id number]  HPI Pt missed appt and was not evalauted   Review of Systems     Objective:   Physical Exam        Assessment & Plan:

## 2012-06-21 ENCOUNTER — Ambulatory Visit: Payer: BC Managed Care – PPO | Admitting: Family Medicine

## 2012-07-02 ENCOUNTER — Telehealth: Payer: Self-pay | Admitting: Family Medicine

## 2012-07-02 DIAGNOSIS — I10 Essential (primary) hypertension: Secondary | ICD-10-CM

## 2012-07-02 DIAGNOSIS — R5383 Other fatigue: Secondary | ICD-10-CM

## 2012-07-02 DIAGNOSIS — Z13220 Encounter for screening for lipoid disorders: Secondary | ICD-10-CM

## 2012-07-02 DIAGNOSIS — E669 Obesity, unspecified: Secondary | ICD-10-CM

## 2012-07-02 DIAGNOSIS — Z139 Encounter for screening, unspecified: Secondary | ICD-10-CM

## 2012-07-02 NOTE — Telephone Encounter (Signed)
What labs does she need? 

## 2012-07-02 NOTE — Telephone Encounter (Signed)
Called pt. Left message to call back. Faxed order to [employer]

## 2012-07-02 NOTE — Telephone Encounter (Signed)
Fasting lipid and cmp, tSH, HBA1C and vit D please

## 2012-07-05 ENCOUNTER — Ambulatory Visit (INDEPENDENT_AMBULATORY_CARE_PROVIDER_SITE_OTHER): Payer: BC Managed Care – PPO | Admitting: Family Medicine

## 2012-07-05 ENCOUNTER — Encounter: Payer: Self-pay | Admitting: Family Medicine

## 2012-07-05 VITALS — BP 120/68 | HR 76 | Resp 18 | Ht <= 58 in | Wt 202.0 lb

## 2012-07-05 DIAGNOSIS — I10 Essential (primary) hypertension: Secondary | ICD-10-CM

## 2012-07-05 DIAGNOSIS — J301 Allergic rhinitis due to pollen: Secondary | ICD-10-CM

## 2012-07-05 DIAGNOSIS — E669 Obesity, unspecified: Secondary | ICD-10-CM

## 2012-07-05 DIAGNOSIS — F411 Generalized anxiety disorder: Secondary | ICD-10-CM

## 2012-07-05 DIAGNOSIS — K219 Gastro-esophageal reflux disease without esophagitis: Secondary | ICD-10-CM

## 2012-07-05 DIAGNOSIS — Z23 Encounter for immunization: Secondary | ICD-10-CM

## 2012-07-05 LAB — COMPREHENSIVE METABOLIC PANEL
CO2: 24 mEq/L (ref 19–32)
Creat: 0.77 mg/dL (ref 0.50–1.10)
Glucose, Bld: 77 mg/dL (ref 70–99)
Total Bilirubin: 0.4 mg/dL (ref 0.3–1.2)

## 2012-07-05 LAB — HEMOGLOBIN A1C
Hgb A1c MFr Bld: 5.3 % (ref <5.7)
Mean Plasma Glucose: 105 mg/dL (ref <117)

## 2012-07-05 LAB — LIPID PANEL
HDL: 68 mg/dL (ref >39)
Triglycerides: 81 mg/dL (ref <150)

## 2012-07-05 LAB — TSH: TSH: 0.422 u[IU]/mL (ref 0.350–4.500)

## 2012-07-05 NOTE — Assessment & Plan Note (Signed)
Controlled, no change in medication DASH diet and commitment to daily physical activity for a minimum of 30 minutes discussed and encouraged, as a part of hypertension management. The importance of attaining a healthy weight is also discussed.  

## 2012-07-05 NOTE — Assessment & Plan Note (Signed)
Unchanged. Patient re-educated about  the importance of commitment to a  minimum of 150 minutes of exercise per week. The importance of healthy food choices with portion control discussed. Encouraged to start a food diary, count calories and to consider  joining a support group. Sample diet sheets offered. Goals set by the patient for the next several months.    

## 2012-07-05 NOTE — Assessment & Plan Note (Signed)
Controlled, no change in medication  

## 2012-07-05 NOTE — Patient Instructions (Addendum)
F/u in 4.5 month  Goal of 4 pound weight loss every 4 weeks  Weekly weighing is important   Flu shot today  Log into my chart for lab results  No med changes   Goal of physical activity  Minimum 30 minutes 5 days per week, ideal is 7 days per week

## 2012-07-05 NOTE — Progress Notes (Signed)
  Subjective:    Patient ID: [name] [name], female    DOB: 28-Apr-1971, 41 y.o.   MRN: [id number]  HPI The PT is here for follow up and re-evaluation of chronic medical conditions, medication management and review of any available recent lab and radiology data.  Preventive health is updated, specifically  Cancer screening and Immunization.   Questions or concerns regarding consultations or procedures which the PT has had in the interim are  addressed. The PT denies any adverse reactions to current medications since the last visit.  There are no new concerns. Very concerned about weight gain, and uncontrolled eating due to excessive stress and poor stress management. Also inconsistent and and inadequate physical activity    Review of Systems See HPI Denies recent fever or chills. Denies sinus pressure, nasal congestion, ear pain or sore throat. Denies chest congestion, productive cough or wheezing. Denies chest pains, palpitations and leg swelling Denies abdominal pain, nausea, vomiting,diarrhea or constipation.   Denies dysuria, frequency, hesitancy or incontinence. Denies joint pain, swelling and limitation in mobility. Denies headaches, seizures, numbness, or tingling.  Denies skin break down or rash.        Objective:   Physical Exam Patient alert and oriented and in no cardiopulmonary distress.  HEENT: No facial asymmetry, EOMI, no sinus tenderness,  oropharynx pink and moist.  Neck supple no adenopathy.  Chest: Clear to auscultation bilaterally.  CVS: S1, S2 no murmurs, no S3.  ABD: Soft non tender. Bowel sounds normal.  Ext: No edema  MS: Adequate ROM spine, shoulders, hips and knees.  Skin: Intact, no ulcerations or rash noted.  Psych: Good eye contact, normal affect. Memory intact not anxious or depressed appearing.  CNS: CN 2-12 intact, power, tone and sensation normal throughout.        Assessment & Plan:

## 2012-07-05 NOTE — Assessment & Plan Note (Signed)
Uncontrolled symptoms with overeating as a problem negative depression screen . If unable to modify behavior with weight loss, will consider SSRI next visit

## 2012-07-05 NOTE — Assessment & Plan Note (Signed)
No acute flare of uncontrolled symptoms currently

## 2012-07-06 ENCOUNTER — Other Ambulatory Visit: Payer: Self-pay | Admitting: Family Medicine

## 2012-07-06 LAB — VITAMIN D 25 HYDROXY (VIT D DEFICIENCY, FRACTURES): Vit D, 25-Hydroxy: 22 ng/mL — ABNORMAL LOW (ref 30–89)

## 2012-07-09 ENCOUNTER — Telehealth: Payer: Self-pay | Admitting: Family Medicine

## 2012-07-09 ENCOUNTER — Other Ambulatory Visit: Payer: Self-pay

## 2012-07-09 MED ORDER — ERGOCALCIFEROL 1.25 MG (50000 UT) PO CAPS: 50000.0000 [IU] | ORAL_CAPSULE | ORAL | Status: DC

## 2012-07-09 NOTE — Telephone Encounter (Signed)
Patient aware of her labwork

## 2012-07-13 ENCOUNTER — Other Ambulatory Visit: Payer: Self-pay

## 2012-07-13 DIAGNOSIS — I10 Essential (primary) hypertension: Secondary | ICD-10-CM

## 2012-07-13 MED ORDER — ALPRAZOLAM 1 MG PO TABS: 1.0000 mg | ORAL_TABLET | Freq: Every evening | ORAL | Status: DC | PRN

## 2012-07-13 MED ORDER — AMLODIPINE BESYLATE 2.5 MG PO TABS: 2.5000 mg | ORAL_TABLET | Freq: Every day | ORAL | Status: DC

## 2012-07-21 ENCOUNTER — Other Ambulatory Visit: Payer: Self-pay | Admitting: Family Medicine

## 2012-11-20 ENCOUNTER — Ambulatory Visit (INDEPENDENT_AMBULATORY_CARE_PROVIDER_SITE_OTHER): Payer: BC Managed Care – PPO | Admitting: Family Medicine

## 2012-11-20 ENCOUNTER — Encounter: Payer: Self-pay | Admitting: Family Medicine

## 2012-11-20 VITALS — BP 128/80 | HR 86 | Resp 18 | Ht <= 58 in | Wt 203.0 lb

## 2012-11-20 DIAGNOSIS — R5381 Other malaise: Secondary | ICD-10-CM

## 2012-11-20 DIAGNOSIS — F411 Generalized anxiety disorder: Secondary | ICD-10-CM

## 2012-11-20 DIAGNOSIS — R5383 Other fatigue: Secondary | ICD-10-CM

## 2012-11-20 DIAGNOSIS — I10 Essential (primary) hypertension: Secondary | ICD-10-CM

## 2012-11-20 DIAGNOSIS — E669 Obesity, unspecified: Secondary | ICD-10-CM

## 2012-11-20 DIAGNOSIS — E559 Vitamin D deficiency, unspecified: Secondary | ICD-10-CM

## 2012-11-20 MED ORDER — PHENTERMINE HCL 37.5 MG PO TABS: 37.5000 mg | ORAL_TABLET | Freq: Every day | ORAL | Status: DC

## 2012-11-20 NOTE — Progress Notes (Signed)
  Subjective:    Patient ID: [name] [name], female    DOB: 01-Feb-1971, 42 y.o.   MRN: [id number]  HPI  The PT is here for follow up and re-evaluation of chronic medical conditions, medication management and review of any available recent lab and radiology data.  Preventive health is updated, specifically  Cancer screening and Immunization.   Questions or concerns regarding consultations or procedures which the PT has had in the interim are  addressed. The PT denies any adverse reactions to current medications since the last visit.  Unhappy with inability to lose weight , willing to explore other options      Review of Systems See HPI Denies recent fever or chills. Denies sinus pressure, nasal congestion, ear pain or sore throat. Denies chest congestion, productive cough or wheezing. Denies chest pains, palpitations and leg swelling Denies abdominal pain, nausea, vomiting,diarrhea or constipation.   Denies dysuria, frequency, hesitancy or incontinence. Denies joint pain, swelling and limitation in mobility. Denies headaches, seizures, numbness, or tingling. Denies depression, anxiety or insomnia. Denies skin break down or rash.        Objective:   Physical Exam  Patient alert and oriented and in no cardiopulmonary distress.  HEENT: No facial asymmetry, EOMI, no sinus tenderness,  oropharynx pink and moist.  Neck supple no adenopathy.  Chest: Clear to auscultation bilaterally.  CVS: S1, S2 no murmurs, no S3.  ABD: Soft non tender. Bowel sounds normal.  Ext: No edema  MS: Adequate ROM spine, shoulders, hips and knees.  Skin: Intact, no ulcerations or rash noted.  Psych: Good eye contact, normal affect. Memory intact not anxious or depressed appearing.  CNS: CN 2-12 intact, power, tone and sensation normal throughout.       Assessment & Plan:

## 2012-11-20 NOTE — Assessment & Plan Note (Signed)
Controlled, no change in medication DASH diet and commitment to daily physical activity for a minimum of 30 minutes discussed and encouraged, as a part of hypertension management. The importance of attaining a healthy weight is also discussed.  

## 2012-11-20 NOTE — Patient Instructions (Addendum)
F/u in 4 month  You are referred to weight loss clinic in [facility] phentermine half daily for appetite suppression.  Commit to 30 minutes every day  Of physical activity  Weight loss goal is 4 pounds per month  Eat over a 12 hour period every day, and have a set bedtime, sleep is important for weight loss   Vit D lab in 4 month prior to visit

## 2012-11-20 NOTE — Assessment & Plan Note (Signed)
Deteriorated. Patient re-educated about  the importance of commitment to a  minimum of 150 minutes of exercise per week. The importance of healthy food choices with portion control discussed. Encouraged to start a food diary, count calories and to consider  joining a support group. Sample diet sheets offered. Goals set by the patient for the next several months.    

## 2012-11-20 NOTE — Assessment & Plan Note (Signed)
Not well controlled, was considering seeing a therapist, but instead wants a referral to bariatric center for all the close support obtained there, No current interest in surgery

## 2012-11-20 NOTE — Assessment & Plan Note (Signed)
Pt to continue weekly supplement

## 2012-11-27 ENCOUNTER — Ambulatory Visit (INDEPENDENT_AMBULATORY_CARE_PROVIDER_SITE_OTHER): Payer: BC Managed Care – PPO | Admitting: Family Medicine

## 2012-11-27 ENCOUNTER — Encounter: Payer: Self-pay | Admitting: Family Medicine

## 2012-11-27 ENCOUNTER — Other Ambulatory Visit: Payer: Self-pay | Admitting: Family Medicine

## 2012-11-27 VITALS — BP 120/90 | HR 64 | Temp 98.7°F | Resp 16

## 2012-11-27 DIAGNOSIS — J069 Acute upper respiratory infection, unspecified: Secondary | ICD-10-CM

## 2012-11-27 DIAGNOSIS — J329 Chronic sinusitis, unspecified: Secondary | ICD-10-CM

## 2012-11-27 MED ORDER — METHYLPREDNISOLONE ACETATE 40 MG/ML IJ SUSP
40.0000 mg | Freq: Once | INTRAMUSCULAR | Status: AC
  Administered 2012-11-27: 40 mg via INTRAMUSCULAR

## 2012-11-27 MED ORDER — AZITHROMYCIN 250 MG PO TABS: ORAL_TABLET | ORAL | Status: DC

## 2012-11-27 MED ORDER — FLUTICASONE PROPIONATE 50 MCG/ACT NA SUSP: 2.0000 | Freq: Every day | NASAL | Status: DC

## 2012-11-27 NOTE — Progress Notes (Signed)
  Subjective:    Patient ID: [name] [name], female    DOB: 18-Aug-1971, 42 y.o.   MRN: [id number]  HPI  Patient presents with sinus drainage, pressure and chest congestion. She denies fever wheezing she has had some sneezing. She is scheduled to go on a trip this week with some school-aged children. She does have history of seasonal allergies but no asthma.  Review of Systems  GEN- denies fatigue, fever, weight loss,weakness, recent illness HEENT- denies eye drainage, change in vision,+ nasal discharge, CVS- denies chest pain, palpitations RESP-  Denies SOB, minimal cough, wheeze ABD- denies N/V, change in stools, abd pain GU- denies dysuria, hematuria, dribbling, incontinence MSK- denies joint pain, muscle aches, injury Neuro- denies headache, dizziness, syncope, seizure activity      Objective:   Physical Exam GEN- NAD, alert and oriented x3 HEENT- PERRL, EOMI, non injected sclera, pink conjunctiva, MMM, oropharynx mild injection, TM clear bilat no effusion, + mild maxillary sinus tenderness, clear rhinorrhea Neck- Supple, no LAD CVS- RRR, no murmur RESP-Clear with some upper airway congestion  EXT- No edema Pulses- Radial 2+         Assessment & Plan:

## 2012-11-27 NOTE — Patient Instructions (Addendum)
Upper respiratory infection with sinus inflammation Steroid shot given Take antibiotics as prescribed  Flonase  F/U as previous with Dr. Lodema Hong

## 2012-11-28 DIAGNOSIS — J069 Acute upper respiratory infection, unspecified: Secondary | ICD-10-CM | POA: Insufficient documentation

## 2012-11-28 DIAGNOSIS — J329 Chronic sinusitis, unspecified: Secondary | ICD-10-CM | POA: Insufficient documentation

## 2012-11-28 NOTE — Assessment & Plan Note (Signed)
Per above, likley viral but with upcoming trip with children and worsening cover with zpak

## 2012-11-28 NOTE — Assessment & Plan Note (Signed)
Flonase, depo medrol in office

## 2013-02-01 ENCOUNTER — Other Ambulatory Visit: Payer: Self-pay | Admitting: Family Medicine

## 2013-02-26 ENCOUNTER — Encounter: Payer: Self-pay | Admitting: Family Medicine

## 2013-02-26 ENCOUNTER — Ambulatory Visit (INDEPENDENT_AMBULATORY_CARE_PROVIDER_SITE_OTHER): Payer: BC Managed Care – PPO | Admitting: Family Medicine

## 2013-02-26 DIAGNOSIS — R7301 Impaired fasting glucose: Secondary | ICD-10-CM

## 2013-02-26 DIAGNOSIS — Z139 Encounter for screening, unspecified: Secondary | ICD-10-CM

## 2013-02-26 DIAGNOSIS — E669 Obesity, unspecified: Secondary | ICD-10-CM

## 2013-02-26 DIAGNOSIS — I10 Essential (primary) hypertension: Secondary | ICD-10-CM

## 2013-02-26 DIAGNOSIS — M549 Dorsalgia, unspecified: Secondary | ICD-10-CM

## 2013-02-26 DIAGNOSIS — F411 Generalized anxiety disorder: Secondary | ICD-10-CM

## 2013-02-26 DIAGNOSIS — R5381 Other malaise: Secondary | ICD-10-CM

## 2013-02-26 MED ORDER — PHENTERMINE HCL 37.5 MG PO TABS: 37.5000 mg | ORAL_TABLET | Freq: Every day | ORAL | Status: DC
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
## 2013-03-05 NOTE — Assessment & Plan Note (Signed)
Deteriorated. Patient re-educated about  the importance of commitment to a  minimum of 150 minutes of exercise per week. The importance of healthy food choices with portion control discussed. Encouraged to start a food diary, count calories and to consider  joining a support group. Sample diet sheets offered. Goals set by the patient for the next several months.    

## 2013-03-05 NOTE — Assessment & Plan Note (Signed)
Controlled, no change in medication  
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
## 2013-03-09 ENCOUNTER — Other Ambulatory Visit: Payer: Self-pay | Admitting: Family Medicine

## 2013-03-26 ENCOUNTER — Ambulatory Visit: Payer: BC Managed Care – PPO | Admitting: Family Medicine

## 2013-04-03 ENCOUNTER — Encounter: Payer: Self-pay | Admitting: Family Medicine

## 2013-04-03 ENCOUNTER — Ambulatory Visit (INDEPENDENT_AMBULATORY_CARE_PROVIDER_SITE_OTHER): Payer: BC Managed Care – PPO | Admitting: Family Medicine

## 2013-04-03 VITALS — BP 120/78 | HR 76 | Temp 98.5°F | Resp 18 | Ht <= 58 in | Wt 201.1 lb

## 2013-04-03 DIAGNOSIS — R5383 Other fatigue: Secondary | ICD-10-CM

## 2013-04-03 DIAGNOSIS — J309 Allergic rhinitis, unspecified: Secondary | ICD-10-CM

## 2013-04-03 DIAGNOSIS — I10 Essential (primary) hypertension: Secondary | ICD-10-CM

## 2013-04-03 DIAGNOSIS — F411 Generalized anxiety disorder: Secondary | ICD-10-CM

## 2013-04-03 DIAGNOSIS — R5381 Other malaise: Secondary | ICD-10-CM

## 2013-04-03 DIAGNOSIS — Z139 Encounter for screening, unspecified: Secondary | ICD-10-CM

## 2013-04-03 DIAGNOSIS — E559 Vitamin D deficiency, unspecified: Secondary | ICD-10-CM

## 2013-04-03 DIAGNOSIS — K219 Gastro-esophageal reflux disease without esophagitis: Secondary | ICD-10-CM

## 2013-04-03 MED ORDER — PREDNISONE 5 MG PO TABS: 5.0000 mg | ORAL_TABLET | Freq: Two times a day (BID) | ORAL | Status: AC

## 2013-04-03 MED ORDER — METHYLPREDNISOLONE ACETATE 80 MG/ML IJ SUSP
80.0000 mg | Freq: Once | INTRAMUSCULAR | Status: AC
  Administered 2013-04-03: 80 mg via INTRAMUSCULAR

## 2013-04-03 NOTE — Patient Instructions (Addendum)
F/u October 19 or after  Fasting lipid, chem 7 , HBA1C, TSh and CBC , vit D October 17 or after  Depo medrol 80 mg IM in office today, and prednisone sent in for 5 days for uncontrolled allergies also  Please start daily claritin and saline washes 2 to 3 times daily, ok to hold on sudafed at this time

## 2013-04-07 NOTE — Progress Notes (Signed)
  Subjective:    Patient ID: [name] [name], female    DOB: 06/10/1971, 42 y.o.   MRN: [id number]  HPI 5 day h/o increased sinus pressure, clear nasal drainage and cough. No sputum, drainage is clear, no fever or chills. Started after excessive pollen exposure this past weekend. Also excessive sneezing and watery eyes No success with weigh loss , plans to be more diligent with her efforts   Review of Systems See HPI Denies chest pains, palpitations and leg swelling Denies abdominal pain, nausea, vomiting,diarrhea or constipation.   Denies dysuria, frequency, hesitancy or incontinence. Denies joint pain, swelling and limitation in mobility. Denies headaches, seizures, numbness, or tingling. Denies depression, anxiety or insomnia. Denies skin break down or rash.        Objective:   Physical Exam  Patient alert and oriented and in no cardiopulmonary distress.  HEENT: No facial asymmetry, EOMI, no sinus tenderness,  oropharynx pink and moist.  Neck supple no adenopathy.Nasal mucosa erythematous and edematous. Conjunctiva watery and erythematous  Chest: Clear to auscultation bilaterally.  CVS: S1, S2 no murmurs, no S3.  ABD: Soft non tender. Bowel sounds normal.  Ext: No edema  MS: Adequate ROM spine, shoulders, hips and knees.  Skin: Intact, no ulcerations or rash noted.  Psych: Good eye contact, normal affect. Memory intact not anxious or depressed appearing.        Assessment & Plan:

## 2013-04-07 NOTE — Assessment & Plan Note (Signed)
Uncontrolled, aggressive course of steroids, including depo medrol in house, flonase  And oral steroids

## 2013-04-07 NOTE — Assessment & Plan Note (Signed)
Controlled, no change in medication DASH diet and commitment to daily physical activity for a minimum of 30 minutes discussed and encouraged, as a part of hypertension management. The importance of attaining a healthy weight is also discussed.  

## 2013-04-07 NOTE — Assessment & Plan Note (Signed)
Controlled, no change in medication  

## 2013-04-07 NOTE — Assessment & Plan Note (Signed)
Unchanged. Patient re-educated about  the importance of commitment to a  minimum of 150 minutes of exercise per week. The importance of healthy food choices with portion control discussed. Encouraged to start a food diary, count calories and to consider  joining a support group. Sample diet sheets offered. Goals set by the patient for the next several months.    

## 2013-04-08 ENCOUNTER — Telehealth: Payer: Self-pay

## 2013-04-08 ENCOUNTER — Other Ambulatory Visit: Payer: Self-pay | Admitting: Family Medicine

## 2013-04-08 MED ORDER — AZITHROMYCIN 250 MG PO TABS: ORAL_TABLET | ORAL | Status: AC

## 2013-04-08 MED ORDER — FLUCONAZOLE 150 MG PO TABS: ORAL_TABLET | ORAL | Status: AC

## 2013-04-08 MED ORDER — BENZONATATE 100 MG PO CAPS: 100.0000 mg | ORAL_CAPSULE | Freq: Four times a day (QID) | ORAL | Status: DC | PRN

## 2013-04-08 NOTE — Telephone Encounter (Signed)
Spoke with pt, z pack, tessalon perles and fluconazole are sent in, she is aware, will call back if no better

## 2013-04-28 ENCOUNTER — Other Ambulatory Visit: Payer: Self-pay | Admitting: Family Medicine

## 2013-05-21 ENCOUNTER — Other Ambulatory Visit: Payer: Self-pay | Admitting: Family Medicine

## 2013-05-29 ENCOUNTER — Ambulatory Visit: Payer: BC Managed Care – PPO | Admitting: Family Medicine

## 2013-06-18 ENCOUNTER — Other Ambulatory Visit: Payer: Self-pay | Admitting: Family Medicine

## 2013-07-09 LAB — LIPID PANEL
Cholesterol: 152 mg/dL (ref 0–200)
HDL: 69 mg/dL (ref >39)
LDL Cholesterol: 68 mg/dL (ref 0–99)
Triglycerides: 77 mg/dL (ref <150)

## 2013-07-09 LAB — CBC
HCT: 38.8 % (ref 36.0–46.0)
Hemoglobin: 13.1 g/dL (ref 12.0–15.0)
MCH: 29.6 pg (ref 26.0–34.0)
MCHC: 33.8 g/dL (ref 30.0–36.0)
MCV: 87.6 fL (ref 78.0–100.0)
RDW: 13.8 % (ref 11.5–15.5)

## 2013-07-09 LAB — BASIC METABOLIC PANEL
BUN: 11 mg/dL (ref 6–23)
CO2: 25 mEq/L (ref 19–32)
Chloride: 106 mEq/L (ref 96–112)
Glucose, Bld: 79 mg/dL (ref 70–99)
Potassium: 4.6 mEq/L (ref 3.5–5.3)
Sodium: 136 mEq/L (ref 135–145)

## 2013-07-11 ENCOUNTER — Encounter: Payer: Self-pay | Admitting: Family Medicine

## 2013-07-11 ENCOUNTER — Ambulatory Visit (INDEPENDENT_AMBULATORY_CARE_PROVIDER_SITE_OTHER): Payer: BC Managed Care – PPO | Admitting: Family Medicine

## 2013-07-11 VITALS — BP 122/76 | HR 77 | Resp 18 | Ht <= 58 in | Wt 202.0 lb

## 2013-07-11 DIAGNOSIS — E669 Obesity, unspecified: Secondary | ICD-10-CM

## 2013-07-11 DIAGNOSIS — F411 Generalized anxiety disorder: Secondary | ICD-10-CM

## 2013-07-11 DIAGNOSIS — I10 Essential (primary) hypertension: Secondary | ICD-10-CM

## 2013-07-11 DIAGNOSIS — J301 Allergic rhinitis due to pollen: Secondary | ICD-10-CM

## 2013-07-11 DIAGNOSIS — E559 Vitamin D deficiency, unspecified: Secondary | ICD-10-CM

## 2013-07-11 DIAGNOSIS — K219 Gastro-esophageal reflux disease without esophagitis: Secondary | ICD-10-CM

## 2013-07-11 MED ORDER — ERGOCALCIFEROL 1.25 MG (50000 UT) PO CAPS: 50000.0000 [IU] | ORAL_CAPSULE | ORAL | Status: DC

## 2013-07-11 MED ORDER — AMLODIPINE BESYLATE 2.5 MG PO TABS: ORAL_TABLET | ORAL | Status: DC

## 2013-07-11 MED ORDER — ALPRAZOLAM 1 MG PO TABS: ORAL_TABLET | ORAL | Status: DC

## 2013-07-11 MED ORDER — OMEPRAZOLE 40 MG PO CPDR: 40.0000 mg | DELAYED_RELEASE_CAPSULE | Freq: Every day | ORAL | Status: DC

## 2013-07-11 MED ORDER — PHENTERMINE HCL 37.5 MG PO TABS: 37.5000 mg | ORAL_TABLET | Freq: Every day | ORAL | Status: DC

## 2013-07-11 NOTE — Progress Notes (Signed)
  Subjective:    Patient ID: [name] [name], female    DOB: 1971-09-16, 42 y.o.   MRN: [id number]  HPI The PT is here for follow up and re-evaluation of chronic medical conditions, medication management and review of any available recent lab and radiology data.  Preventive health is updated, specifically  Cancer screening and Immunization.   . The PT denies any adverse reactions to current medications since the last visit.  There are no new concerns.  There are no specific complaints , wants to resume phentermine for help with weight loss since no improvement on her own A lot of stress on the job currently also      Review of Systems See HPI Denies recent fever or chills. Denies sinus pressure, nasal congestion, ear pain or sore throat. Denies chest congestion, productive cough or wheezing. Denies chest pains, palpitations and leg swelling Denies abdominal pain, nausea, vomiting,diarrhea or constipation.   Denies dysuria, frequency, hesitancy or incontinence. Denies joint pain, swelling and limitation in mobility. Denies headaches, seizures, numbness, or tingling. Denies depression, has increased  anxiety or insomnia. Denies skin break down or rash.        Objective:   Physical Exam Patient alert and oriented and in no cardiopulmonary distress.  HEENT: No facial asymmetry, EOMI, no sinus tenderness,  oropharynx pink and moist.  Neck supple no adenopathy.  Chest: Clear to auscultation bilaterally.  CVS: S1, S2 no murmurs, no S3.  ABD: Soft non tender. Bowel sounds normal.  Ext: No edema  MS: Adequate ROM spine, shoulders, hips and knees.  Skin: Intact, no ulcerations or rash noted.  Psych: Good eye contact, normal affect. Memory intact not anxious or depressed appearing.  CNS: CN 2-12 intact, power, tone and sensation normal throughout.        Assessment & Plan:

## 2013-07-11 NOTE — Patient Instructions (Addendum)
F/u in 4 month, call if you need me before  Reconsider flu vaccine , you need this  Please schedule mammogram , nearly 2 years since the last one  Excellent labs  New for reflux is omeprazole, also reduce caffeine  Resume HALF phentermine once daily, weight loss goal of 2.5 to 3 pounds per month

## 2013-07-15 ENCOUNTER — Telehealth: Payer: Self-pay | Admitting: *Deleted

## 2013-07-15 ENCOUNTER — Ambulatory Visit (INDEPENDENT_AMBULATORY_CARE_PROVIDER_SITE_OTHER): Payer: BC Managed Care – PPO | Admitting: Family Medicine

## 2013-07-15 ENCOUNTER — Encounter: Payer: Self-pay | Admitting: Family Medicine

## 2013-07-15 VITALS — BP 122/80 | HR 100 | Resp 18 | Ht <= 58 in | Wt 203.1 lb

## 2013-07-15 DIAGNOSIS — R002 Palpitations: Secondary | ICD-10-CM

## 2013-07-15 DIAGNOSIS — F411 Generalized anxiety disorder: Secondary | ICD-10-CM

## 2013-07-15 DIAGNOSIS — I10 Essential (primary) hypertension: Secondary | ICD-10-CM

## 2013-07-15 MED ORDER — METOPROLOL SUCCINATE 12.5 MG HALF TABLET: ORAL_TABLET | ORAL | Status: DC

## 2013-07-15 MED ORDER — PAROXETINE HCL 10 MG PO TABS: 10.0000 mg | ORAL_TABLET | ORAL | Status: DC

## 2013-07-15 MED ORDER — METOPROLOL SUCCINATE ER 25 MG PO TB24: ORAL_TABLET | ORAL | Status: DC

## 2013-07-15 NOTE — Telephone Encounter (Signed)
Returning Your Call..Please call ... Thanks

## 2013-07-15 NOTE — Telephone Encounter (Signed)
Returned call.  Left message to call back before 4pm.  

## 2013-07-15 NOTE — Patient Instructions (Signed)
F/u in 2nd week in December.   Heart rate currently is regular, and EKG shows no heart damage  Resume once daily metoprolol to help to control rapid rate   You are referred to  Cardiology, call in am about appt  No Phentermine  New for generalized anxiety , is paxil 10mg  daily  Work excuse from 10/27 to return 07/17/2013

## 2013-07-15 NOTE — Telephone Encounter (Signed)
Returned a call to patient in reference to her heart palpitation symptoms. She states that she needs to see someone here before next week for evaluation. Offered her an appointment tomorrow to see [facility]. She states that she is currently at her PCP's office and would like to call us back after she is evaluated by her PCP and see what her recommendations are.

## 2013-07-15 NOTE — Progress Notes (Signed)
  Subjective:    Patient ID: [name] [name], female    DOB: [birth year], 42 y.o.   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 Denies sinus pressure, nasal congestion, ear pain or sore throat. Denies chest congestion, productive cough or wheezing. Denies chest pains, PND or orthopnea and leg swelling Denies abdominal pain, nausea, vomiting,diarrhea or constipation.   Denies dysuria, frequency, hesitancy or incontinence. Denies joint pain, swelling and limitation in mobility. Denies headaches, seizures, numbness, Denies depression, anxiety or insomnia. Denies skin break down or rash.     Objective:   Physical Exam  Patient alert and oriented and in no cardiopulmonary distress.Anxious  HEENT: No facial asymmetry, EOMI, no sinus tenderness,  oropharynx pink and moist.  Neck supple no adenopathy.  Chest: Clear to auscultation bilaterally.  CVS: S1, S2 no murmurs, no S3.no reproducible chest pain EKG: NSR no ischemia  ABD: Soft non tender. Bowel sounds normal.  Ext: No edema  MS: Adequate ROM spine, shoulders, hips and knees.  Skin: Intact, no ulcerations or rash noted.  Psych: Good eye contact, normal affect. Memory intact anxious not  depressed appearing.  CNS: CN 2-12 intact, power, tone and sensation normal throughout.       Assessment & Plan:

## 2013-07-15 NOTE — Telephone Encounter (Signed)
Pt is having some issues heart fluttering, SOB that comes and goes and she said it is intense. She has a scheduled appointment for next week and wants to know if she can come see someone this week.

## 2013-07-17 ENCOUNTER — Encounter: Payer: Self-pay | Admitting: Cardiovascular Disease

## 2013-07-17 ENCOUNTER — Ambulatory Visit (INDEPENDENT_AMBULATORY_CARE_PROVIDER_SITE_OTHER): Payer: BC Managed Care – PPO | Admitting: Cardiovascular Disease

## 2013-07-17 VITALS — BP 120/66 | HR 68 | Ht <= 58 in | Wt 202.4 lb

## 2013-07-17 DIAGNOSIS — R0609 Other forms of dyspnea: Secondary | ICD-10-CM

## 2013-07-17 DIAGNOSIS — R002 Palpitations: Secondary | ICD-10-CM

## 2013-07-17 DIAGNOSIS — R0989 Other specified symptoms and signs involving the circulatory and respiratory systems: Secondary | ICD-10-CM

## 2013-07-17 DIAGNOSIS — R0600 Dyspnea, unspecified: Secondary | ICD-10-CM

## 2013-07-17 NOTE — Patient Instructions (Addendum)
Your physician has requested that you have an echocardiogram. Echocardiography is a painless test that uses sound waves to create images of your heart. It provides your doctor with information about the size and shape of your heart and how well your heart's chambers and valves are working. This procedure takes approximately one hour. There are no restrictions for this procedure.  Your physician recommends that you schedule a follow-up appointment in: 6 weeks  

## 2013-07-21 DIAGNOSIS — R002 Palpitations: Secondary | ICD-10-CM | POA: Insufficient documentation

## 2013-07-21 NOTE — Progress Notes (Signed)
Patient ID: [name] [name], female   DOB: 08-Feb-1971, 42 y.o.   MRN: [id number]      Reason for office visit Palpitations  When I last saw [name] in February of 2013 she had brief isolated palpitations. She now returns with complaints of sustained racing heartbeat, lasting 20-30 minutes at a time and sometimes associated with shortness of breath and even wheezing. Dr. Lodema Hong has started her on metoprolol. She states it makes her feel very sluggish. In the past when we tried treatment with nebivolol and diltiazem she stopped these medicines for side effects as well. She is sedentary and morbidly obese. She denies problems with shortness of breath, dizziness, syncope, chest pain outside of the spells of palpitations. She does have an anxiety disorder for which she takes a low dose of Paxil. She has mild systemic hypertension well-controlled on a tiny dose of amlodipine.    Allergies  Allergen Reactions  . Diltiazem Rash    Current Outpatient Prescriptions  Medication Sig Dispense Refill  . ALPRAZolam (XANAX) 1 MG tablet TAKE 1 TABLET BY MOUTH EVERY NIGHT AT BEDTIME AS NEEDED FOR SLEEP  30 tablet  2  . amLODipine (NORVASC) 2.5 MG tablet TAKE 1 TABLET BY MOUTH EVERY DAY  90 tablet  0  . ergocalciferol (VITAMIN D2) 50000 UNITS capsule Take 1 capsule (50,000 Units total) by mouth once a week. One capsule once weekly  12 capsule  1  . fluticasone (FLONASE) 50 MCG/ACT nasal spray Place 2 sprays into the nose daily as needed.      . metoprolol succinate (TOPROL-XL) 25 MG 24 hr tablet 1/2 tablet daily for palpitations  15 tablet  0  . Naproxen Sodium (ALEVE PO) Take by mouth. Take 2 tablets as needed         . omeprazole (PRILOSEC) 40 MG capsule Take 1 capsule (40 mg total) by mouth daily.  30 capsule  5  . PARoxetine (PAXIL) 10 MG tablet Take 1 tablet (10 mg total) by mouth every morning.  30 tablet  3  . RESTASIS 0.05 % ophthalmic emulsion as needed.       No current facility-administered  medications for this visit.    Past Medical History  Diagnosis Date  . Anxiety   . GERD (gastroesophageal reflux disease)   . PONV (postoperative nausea and vomiting)   . Shortness of breath   . Hypertension     seen by [facility]    Past Surgical History  Procedure Laterality Date  . Cesarean section    . Dilation and curettage of uterus    . Laparoscopic tubal ligation  04/02/2012    Procedure: LAPAROSCOPIC TUBAL LIGATION;  Surgeon: Serita Kyle, MD;  Location: [facility];  Service: Gynecology;  Laterality: Bilateral;  . Hysteroscopy w/d&c  04/02/2012    Procedure: DILATATION AND CURETTAGE /HYSTEROSCOPY;  Surgeon: Serita Kyle, MD;  Location: [facility];  Service: Gynecology;  Laterality: N/A;    No family history on file.  History   Social History  . Marital Status: Divorced    Spouse Name: N/A    Number of Children: N/A  . Years of Education: N/A   Occupational History  . Not on file.   Social History Main Topics  . Smoking status: Never Smoker   . Smokeless tobacco: Not on file  . Alcohol Use: Yes     Comment: occasionally  . Drug Use: No  . Sexual Activity: Not on file   Other Topics Concern  .  Not on file   Social History Narrative  . No narrative on file    Review of systems: The patient specifically denies any chest pain at rest or with exertion, dyspnea at rest or with exertion, orthopnea, paroxysmal nocturnal dyspnea, syncope, focal neurological deficits, intermittent claudication, lower extremity edema, unexplained weight gain, cough, hemoptysis or wheezing.  The patient also denies abdominal pain, nausea, vomiting, dysphagia, diarrhea, constipation, polyuria, polydipsia, dysuria, hematuria, frequency, urgency, abnormal bleeding or bruising, fever, chills, unexpected weight changes, mood swings, change in skin or hair texture, change in voice quality, auditory or visual problems, allergic reactions or rashes, new  musculoskeletal complaints other than usual "aches and pains".   PHYSICAL EXAM BP 120/66  Pulse 68  Ht 4\' 10"  (1.473 m)  Wt 202 lb 6.4 oz (91.808 kg)  BMI 42.31 kg/m2  General: Alert, oriented x3, no distress Head: no evidence of trauma, PERRL, EOMI, no exophtalmos or lid lag, no myxedema, no xanthelasma; normal ears, nose and oropharynx Neck: normal jugular venous pulsations and no hepatojugular reflux; brisk carotid pulses without delay and no carotid bruits Chest: clear to auscultation, no signs of consolidation by percussion or palpation, normal fremitus, symmetrical and full respiratory excursions Cardiovascular: normal position and quality of the apical impulse, regular rhythm, normal first and second heart sounds, no murmurs, rubs or gallops Abdomen: no tenderness or distention, no masses by palpation, no abnormal pulsatility or arterial bruits, normal bowel sounds, no hepatosplenomegaly Extremities: no clubbing, cyanosis or edema; 2+ radial, ulnar and brachial pulses bilaterally; 2+ right femoral, posterior tibial and dorsalis pedis pulses; 2+ left femoral, posterior tibial and dorsalis pedis pulses; no subclavian or femoral bruits Neurological: grossly nonfocal   EKG: Normal sinus rhythm, normal tracing QTc interval 422 ms  Lipid Panel     Component Value Date/Time   CHOL 152 07/09/2013 1327   TRIG 77 07/09/2013 1327   HDL 69 07/09/2013 1327   CHOLHDL 2.2 07/09/2013 1327   VLDL 15 07/09/2013 1327   LDLCALC 68 07/09/2013 1327    BMET    Component Value Date/Time   NA 136 07/09/2013 1327   K 4.6 07/09/2013 1327   CL 106 07/09/2013 1327   CO2 25 07/09/2013 1327   GLUCOSE 79 07/09/2013 1327   BUN 11 07/09/2013 1327   CREATININE 0.79 07/09/2013 1327   CREATININE 0.75 03/29/2012 1620   CALCIUM 9.0 07/09/2013 1327   GFRNONAA >90 03/29/2012 1620   GFRAA >90 03/29/2012 1620     ASSESSMENT AND PLAN Rapid palpitations The description of her palpitations has mostly  features suggestive of sinus tachycardia since the rapid heartbeat resolves on a gradual rather than abrupt basis. She does have a history of anxiety. Occasionally the onset of the palpitations is very abrupt. She describes the heart rate is very regular although it is fast. She is mostly concerned about the palpitations as a result of her strong family history of early cardiac disease and sudden death. I recommended that she undergo an echocardiogram. Ultimately he'll be more important to make sure that she has a structurally healthy heart rather than to catch the arrhythmia itself. But an event monitor will also be useful. In the past, empirical treatment with a beta blocker made her feel very fatigued ("she felt like a zombie"). We even tried bystolic, but this was also poorly tolerated. After that I prescribed her diltiazem but she stopped this medication as well (although she's not sure what the side effects were). Ultimately if we cannot identify a  empirical solution to her problem we might even have to consider implantation of a loop recorder to make sure that she does not have a true arrhythmia.  Orders Placed This Encounter  Procedures  . Cardiac event monitor  . 2D Echocardiogram without contrast   Meds ordered this encounter  Medications  . fluticasone (FLONASE) 50 MCG/ACT nasal spray    Sig: Place 2 sprays into the nose daily as needed.  . RESTASIS 0.05 % ophthalmic emulsion    Sig: as needed.    Junious Silk, MD, [facility] [phone] office [phone] pager

## 2013-07-21 NOTE — Assessment & Plan Note (Signed)
Controlled, no change in medication Add ;low dose toprol for rate control until cardiology able to asses pt

## 2013-07-21 NOTE — Assessment & Plan Note (Addendum)
The description of her palpitations has mostly features suggestive of sinus tachycardia since the rapid heartbeat resolves on a gradual rather than abrupt basis. She does have a history of anxiety. Occasionally the onset of the palpitations is very abrupt. She describes the heart rate is very regular although it is fast. She is mostly concerned about the palpitations as a result of her strong family history of early cardiac disease and sudden death. I recommended that she undergo an echocardiogram. Ultimately he'll be more important to make sure that she has a structurally healthy heart rather than to catch the arrhythmia itself. But an event monitor will also be useful. In the past, empirical treatment with a beta blocker made her feel very fatigued ("she felt like a zombie"). We even tried bystolic, but this was also poorly tolerated. After that I prescribed her diltiazem but she stopped this medication as well (although she's not sure what the side effects were). Ultimately if we cannot identify a empirical solution to her problem we might even have to consider implantation of a loop recorder to make sure that she does not have a true arrhythmia.

## 2013-07-21 NOTE — Assessment & Plan Note (Signed)
Deteriorated. Patient re-educated about  the importance of commitment to a  minimum of 150 minutes of exercise per week. The importance of healthy food choices with portion control discussed. Encouraged to start a food diary, count calories and to consider  joining a support group. Sample diet sheets offered. Goals set by the patient for the next several months.    

## 2013-07-21 NOTE — Assessment & Plan Note (Signed)
Uncontrolled , due to increase workload in past 1 month, also recently re married, resume SSRI, work excuse for 2 days

## 2013-07-21 NOTE — Assessment & Plan Note (Signed)
Symptomatic palpaitations , unprovoked this past weekend HJad been on beta blocker , but had s/e of depression so this was discontinued, will resu,me low dose until cardiologfy sees the pt. Office EKG done, NSR, no ischemia

## 2013-07-22 ENCOUNTER — Ambulatory Visit: Payer: BC Managed Care – PPO | Admitting: Cardiovascular Disease

## 2013-08-01 ENCOUNTER — Encounter: Payer: Self-pay | Admitting: Family Medicine

## 2013-08-05 ENCOUNTER — Ambulatory Visit (HOSPITAL_COMMUNITY): Payer: BC Managed Care – PPO

## 2013-08-08 ENCOUNTER — Ambulatory Visit: Payer: BC Managed Care – PPO | Admitting: Cardiovascular Disease

## 2013-08-11 ENCOUNTER — Other Ambulatory Visit: Payer: Self-pay | Admitting: Family Medicine

## 2013-08-19 ENCOUNTER — Encounter: Payer: Self-pay | Admitting: Cardiovascular Disease

## 2013-08-21 ENCOUNTER — Ambulatory Visit (HOSPITAL_COMMUNITY)
Admission: RE | Admit: 2013-08-21 | Discharge: 2013-08-21 | Disposition: A | Discharge status: Home / Self Care | Payer: BC Managed Care – PPO | Source: Ambulatory Visit | Attending: Cardiovascular Disease | Admitting: Cardiovascular Disease

## 2013-08-21 DIAGNOSIS — R0602 Shortness of breath: Secondary | ICD-10-CM

## 2013-08-21 DIAGNOSIS — R0989 Other specified symptoms and signs involving the circulatory and respiratory systems: Secondary | ICD-10-CM | POA: Insufficient documentation

## 2013-08-21 DIAGNOSIS — R002 Palpitations: Secondary | ICD-10-CM | POA: Insufficient documentation

## 2013-08-21 DIAGNOSIS — R0600 Dyspnea, unspecified: Secondary | ICD-10-CM

## 2013-08-21 DIAGNOSIS — R0609 Other forms of dyspnea: Secondary | ICD-10-CM | POA: Insufficient documentation

## 2013-08-21 NOTE — Progress Notes (Signed)
2D Echo Performed 08/21/2013    Clearence Ped, RCS

## 2013-08-30 ENCOUNTER — Ambulatory Visit: Payer: BC Managed Care – PPO | Admitting: Cardiovascular Disease

## 2013-09-05 ENCOUNTER — Ambulatory Visit (INDEPENDENT_AMBULATORY_CARE_PROVIDER_SITE_OTHER): Payer: BC Managed Care – PPO | Admitting: Cardiovascular Disease

## 2013-09-05 ENCOUNTER — Encounter: Payer: Self-pay | Admitting: Cardiovascular Disease

## 2013-09-05 ENCOUNTER — Other Ambulatory Visit: Payer: Self-pay | Admitting: Family Medicine

## 2013-09-05 VITALS — BP 118/90 | HR 72 | Ht <= 58 in | Wt 203.7 lb

## 2013-09-05 DIAGNOSIS — R002 Palpitations: Secondary | ICD-10-CM

## 2013-09-05 DIAGNOSIS — I471 Supraventricular tachycardia: Secondary | ICD-10-CM

## 2013-09-05 NOTE — Patient Instructions (Signed)
Your physician recommends that you schedule a follow-up appointment in: One Year.  

## 2013-09-06 NOTE — Telephone Encounter (Signed)
No longer patient of Dr Jeanice Lim  Refill denied

## 2013-09-08 DIAGNOSIS — I471 Supraventricular tachycardia: Secondary | ICD-10-CM | POA: Insufficient documentation

## 2013-09-08 NOTE — Assessment & Plan Note (Signed)
More symptomatic in past several months, resume PPI

## 2013-09-08 NOTE — Assessment & Plan Note (Signed)
Controlled, no change in medication DASH diet and commitment to daily physical activity for a minimum of 30 minutes discussed and encouraged, as a part of hypertension management. The importance of attaining a healthy weight is also discussed.  

## 2013-09-08 NOTE — Assessment & Plan Note (Signed)
Controlled, no change in medication  

## 2013-09-08 NOTE — Assessment & Plan Note (Signed)
Needs weekly supplements for 6 month at least

## 2013-09-08 NOTE — Assessment & Plan Note (Signed)
Deteriorated. Patient re-educated about  the importance of commitment to a  minimum of 150 minutes of exercise per week. The importance of healthy food choices with portion control discussed. Encouraged to start a food diary, count calories and to consider  joining a support group. Sample diet sheets offered. Goals set by the patient for the next several months.    

## 2013-09-08 NOTE — Progress Notes (Signed)
Patient ID: [name] [name], female   DOB: 1971/06/17, 42 y.o.   MRN: [id number]      Reason for office visit Followup echocardiogram and arrhythmia monitor  [name] returns to discuss the findings of echo cardiac 30 day monitor, brought on by persistent problems with palpitations. She denies any chest pain, shortness of breath, edema or syncope. She has not had any neurological complaints.  Her echocardiogram is normal without any evidence of chamber hypertrophy or enlargement, with normal left ventricular systolic and diastolic function and without any meaningful valvular abnormalities.  Her 30 day event monitor showed that her palpitations are related to occasional PACs and extremely brief 3-4 beat runs of paroxysmal atrial tachycardia. Atrial fibrillation was not seen. No ventricular arrhythmia was noted. Similar to previous evaluation many years past.   Allergies  Allergen Reactions  . Diltiazem Rash    Current Outpatient Prescriptions  Medication Sig Dispense Refill  . ALPRAZolam (XANAX) 1 MG tablet TAKE 1 TABLET BY MOUTH EVERY NIGHT AT BEDTIME AS NEEDED FOR SLEEP  30 tablet  2  . amLODipine (NORVASC) 2.5 MG tablet TAKE 1 TABLET BY MOUTH EVERY DAY  90 tablet  0  . ergocalciferol (VITAMIN D2) 50000 UNITS capsule Take 1 capsule (50,000 Units total) by mouth once a week. One capsule once weekly  12 capsule  1  . fluticasone (FLONASE) 50 MCG/ACT nasal spray Place 2 sprays into the nose daily as needed.      . Naproxen Sodium (ALEVE PO) Take by mouth. Take 2 tablets as needed         . omeprazole (PRILOSEC) 40 MG capsule Take 1 capsule (40 mg total) by mouth daily.  30 capsule  5  . RESTASIS 0.05 % ophthalmic emulsion as needed.      Marland Kitchen VICODIN ES 7.5-300 MG TABS as needed.       No current facility-administered medications for this visit.    Past Medical History  Diagnosis Date  . Anxiety   . GERD (gastroesophageal reflux disease)   . PONV (postoperative nausea and vomiting)     . Shortness of breath   . Hypertension     seen by [facility]  . Palpitations   . Venous insufficiency     Past Surgical History  Procedure Laterality Date  . Cesarean section    . Dilation and curettage of uterus    . Laparoscopic tubal ligation  04/02/2012    Procedure: LAPAROSCOPIC TUBAL LIGATION;  Surgeon: Serita Kyle, MD;  Location: [facility];  Service: Gynecology;  Laterality: Bilateral;  . Hysteroscopy w/d&c  04/02/2012    Procedure: DILATATION AND CURETTAGE /HYSTEROSCOPY;  Surgeon: Serita Kyle, MD;  Location: [facility];  Service: Gynecology;  Laterality: N/A;  . Transthoracic echocardiogram  01/11/2009    EF >55%, normal    No family history on file.  History   Social History  . Marital Status: Divorced    Spouse Name: N/A    Number of Children: N/A  . Years of Education: N/A   Occupational History  . Not on file.   Social History Main Topics  . Smoking status: Never Smoker   . Smokeless tobacco: Not on file  . Alcohol Use: Yes     Comment: occasionally  . Drug Use: No  . Sexual Activity: Not on file   Other Topics Concern  . Not on file   Social History Narrative  . No narrative on file    Review of systems: The  patient specifically denies any chest pain at rest or with exertion, dyspnea at rest or with exertion, orthopnea, paroxysmal nocturnal dyspnea, syncope, focal neurological deficits, intermittent claudication, lower extremity edema, unexplained weight gain, cough, hemoptysis or wheezing.  The patient also denies abdominal pain, nausea, vomiting, dysphagia, diarrhea, constipation, polyuria, polydipsia, dysuria, hematuria, frequency, urgency, abnormal bleeding or bruising, fever, chills, unexpected weight changes, mood swings, change in skin or hair texture, change in voice quality, auditory or visual problems, allergic reactions or rashes, new musculoskeletal complaints other than usual "aches and pains".   PHYSICAL  EXAM BP 118/90  Pulse 72  Ht 4\' 10"  (1.473 m)  Wt 203 lb 11.2 oz (92.398 kg)  BMI 42.59 kg/m2  General: Alert, oriented x3, no distress  Head: no evidence of trauma, PERRL, EOMI, no exophtalmos or lid lag, no myxedema, no xanthelasma; normal ears, nose and oropharynx  Neck: normal jugular venous pulsations and no hepatojugular reflux; brisk carotid pulses without delay and no carotid bruits  Chest: clear to auscultation, no signs of consolidation by percussion or palpation, normal fremitus, symmetrical and full respiratory excursions  Cardiovascular: normal position and quality of the apical impulse, regular rhythm, normal first and second heart sounds, no murmurs, rubs or gallops  Abdomen: no tenderness or distention, no masses by palpation, no abnormal pulsatility or arterial bruits, normal bowel sounds, no hepatosplenomegaly  Extremities: no clubbing, cyanosis or edema; 2+ radial, ulnar and brachial pulses bilaterally; 2+ right femoral, posterior tibial and dorsalis pedis pulses; 2+ left femoral, posterior tibial and dorsalis pedis pulses; no subclavian or femoral bruits  Neurological: grossly nonfocal   EKG: Normal sinus rhythm, normal tracing QTc interval 422 ms  Lipid Panel     Component Value Date/Time   CHOL 152 07/09/2013 1327   TRIG 77 07/09/2013 1327   HDL 69 07/09/2013 1327   CHOLHDL 2.2 07/09/2013 1327   VLDL 15 07/09/2013 1327   LDLCALC 68 07/09/2013 1327    BMET    Component Value Date/Time   NA 136 07/09/2013 1327   K 4.6 07/09/2013 1327   CL 106 07/09/2013 1327   CO2 25 07/09/2013 1327   GLUCOSE 79 07/09/2013 1327   BUN 11 07/09/2013 1327   CREATININE 0.79 07/09/2013 1327   CREATININE 0.75 03/29/2012 1620   CALCIUM 9.0 07/09/2013 1327   GFRNONAA >90 03/29/2012 1620   GFRAA >90 03/29/2012 1620     ASSESSMENT AND PLAN Mrs. Harland Dingwall has palpitations related to PACs and very brief runs of atrial tachycardia. Previous attempts at treatment with beta blockers and  calcium channel blockers are associated with intolerable fatigue. She's not so much troubled by palpitations as wanting reassurance that they are not a harbinger of a serious complication. I was able to offer her this reassurance since she does not have any meaningful structural cardiac abnormalities. She is advised to try to lose weight since she is morbidly obese, and she is aware of this need and finds it quite challenging.  We talked about more aggressive treatment for her palpitations with antiarrhythmics but she finds the potential risks of these drugs unacceptable since her symptoms are so mild. She will call back if she wants to try treatment with beta blockers again.  Patient Instructions  Your physician recommends that you schedule a follow-up appointment in: One Year.    Meds ordered this encounter  Medications  . VICODIN ES 7.5-300 MG TABS    Sig: as needed.    Chipper Koudelka  Thurmon Fair, MD, [facility] (  [phone] office [phone] pager

## 2013-09-10 ENCOUNTER — Ambulatory Visit: Payer: BC Managed Care – PPO | Admitting: Cardiovascular Disease

## 2013-09-17 ENCOUNTER — Other Ambulatory Visit: Payer: Self-pay | Admitting: Family Medicine

## 2013-09-17 NOTE — Telephone Encounter (Signed)
Simpson pt °

## 2013-09-18 ENCOUNTER — Other Ambulatory Visit: Payer: Self-pay

## 2013-09-18 MED ORDER — FLUTICASONE PROPIONATE 50 MCG/ACT NA SUSP: 2.0000 | Freq: Every day | NASAL | Status: DC | PRN

## 2013-09-30 ENCOUNTER — Telehealth: Payer: Self-pay | Admitting: Adult Health

## 2013-10-18 ENCOUNTER — Other Ambulatory Visit: Payer: Self-pay | Admitting: Family Medicine

## 2013-10-22 ENCOUNTER — Other Ambulatory Visit: Payer: Self-pay | Admitting: Family Medicine

## 2013-12-18 ENCOUNTER — Other Ambulatory Visit: Payer: Self-pay

## 2013-12-18 MED ORDER — AMLODIPINE BESYLATE 2.5 MG PO TABS: ORAL_TABLET | ORAL | Status: DC

## 2013-12-18 MED ORDER — ALPRAZOLAM 1 MG PO TABS: ORAL_TABLET | ORAL | Status: DC

## 2013-12-19 ENCOUNTER — Ambulatory Visit (INDEPENDENT_AMBULATORY_CARE_PROVIDER_SITE_OTHER): Payer: BC Managed Care – PPO | Admitting: Family Medicine

## 2013-12-19 ENCOUNTER — Encounter: Payer: Self-pay | Admitting: Family Medicine

## 2013-12-19 VITALS — BP 124/80 | HR 74 | Resp 18 | Ht <= 58 in | Wt 214.1 lb

## 2013-12-19 DIAGNOSIS — K219 Gastro-esophageal reflux disease without esophagitis: Secondary | ICD-10-CM

## 2013-12-19 DIAGNOSIS — E559 Vitamin D deficiency, unspecified: Secondary | ICD-10-CM

## 2013-12-19 DIAGNOSIS — I10 Essential (primary) hypertension: Secondary | ICD-10-CM

## 2013-12-19 DIAGNOSIS — J301 Allergic rhinitis due to pollen: Secondary | ICD-10-CM

## 2013-12-19 DIAGNOSIS — F411 Generalized anxiety disorder: Secondary | ICD-10-CM

## 2013-12-19 MED ORDER — AMLODIPINE BESYLATE 2.5 MG PO TABS: ORAL_TABLET | ORAL | Status: DC

## 2013-12-19 MED ORDER — CLOTRIMAZOLE-BETAMETHASONE 1-0.05 % EX CREA: TOPICAL_CREAM | Freq: Two times a day (BID) | CUTANEOUS | Status: DC | PRN

## 2013-12-19 MED ORDER — CITALOPRAM HYDROBROMIDE 10 MG PO TABS: 10.0000 mg | ORAL_TABLET | Freq: Every day | ORAL | Status: DC

## 2013-12-19 MED ORDER — NYSTATIN 100000 UNIT/GM EX POWD: Freq: Two times a day (BID) | CUTANEOUS | Status: DC

## 2013-12-19 MED ORDER — ALPRAZOLAM 1 MG PO TABS: ORAL_TABLET | ORAL | Status: DC

## 2013-12-19 MED ORDER — DEXLANSOPRAZOLE 30 MG PO CPDR: 30.0000 mg | DELAYED_RELEASE_CAPSULE | Freq: Every day | ORAL | Status: DC

## 2013-12-19 NOTE — Patient Instructions (Addendum)
F/u in 4 month, call if you need me before  New for anxiety is citalopram 10 mg one daily  Stick with the lifestyle change you plan and good things will happen  Medication  Will be refilled as discussed  New for reflux is dexilant 30mg  one daily

## 2013-12-21 NOTE — Assessment & Plan Note (Signed)
Uncontrolled, needs medication which is covered by her ins, trial of dexilant

## 2013-12-21 NOTE — Assessment & Plan Note (Signed)
Deteriorated, spouse has multiple medical conditions, also now has blended family with more children involved. Unable ot focus on lifestyle change for weight loss, wants to try medication to help with GAD which is a good decision

## 2013-12-21 NOTE — Assessment & Plan Note (Signed)
Controlled, no change in medication DASH diet and commitment to daily physical activity for a minimum of 30 minutes discussed and encouraged, as a part of hypertension management. The importance of attaining a healthy weight is also discussed.  

## 2013-12-21 NOTE — Assessment & Plan Note (Signed)
Improved, needs to continue supplement

## 2013-12-21 NOTE — Assessment & Plan Note (Signed)
Increased as expected in early Spring. Daily use of medication for control is stressed

## 2013-12-21 NOTE — Assessment & Plan Note (Signed)
Deteriorated. Patient re-educated about  the importance of commitment to a  minimum of 150 minutes of exercise per week. The importance of healthy food choices with portion control discussed. Encouraged to start a food diary, count calories and to consider  joining a support group. Sample diet sheets offered. Goals set by the patient for the next several months.    

## 2013-12-21 NOTE — Progress Notes (Signed)
   Subjective:    Patient ID: [name] [name], female    DOB: March 22, 1971, 43 y.o.   MRN: [id number]  HPI The PT is here for follow up and re-evaluation of chronic medical conditions, medication management and review of any available recent lab and radiology data.  Preventive health is updated, specifically  Cancer screening and Immunization.    The PT denies any adverse reactions to current medications since the last visit. Unable to fill medication for gERD which has worsened and is uncontrolled, requests substitute which will be covered C/o increased and uncontrolled anxiety, new stresses with marriage, not depressed, but overwhelmed between job and home, weight continues to rise, wants help wit mood to help with weight management, has started walking , intolerant of phentermine with h/o palpitations      Review of Systems See HPI Denies recent fever or chills. Denies sinus pressure, nasal congestion, ear pain or sore throat. Denies chest congestion, productive cough or wheezing. Denies chest pains, palpitations and leg swelling Denies  vomiting,diarrhea or constipation.   Denies dysuria, frequency, hesitancy or incontinence. Denies joint pain, swelling and limitation in mobility. Denies headaches, seizures, numbness, or tingling.  Denies skin break down or rash.        Objective:   Physical Exam  BP 124/80  Pulse 74  Resp 18  Ht 4\' 10"  (1.473 m)  Wt 214 lb 1.3 oz (97.106 kg)  BMI 44.75 kg/m2  SpO2 96% Patient alert and oriented and in no cardiopulmonary distress.  HEENT: No facial asymmetry, EOMI, no sinus tenderness,  oropharynx pink and moist.  Neck supple no adenopathy.  Chest: Clear to auscultation bilaterally.  CVS: S1, S2 no murmurs, no S3.  ABD: Soft non tender. Bowel sounds normal.  Ext: No edema  MS: Adequate ROM spine, shoulders, hips and knees.  Skin: Intact, no ulcerations or rash noted.  Psych: Good eye contact, normal affect. Memory intact not  anxious or depressed appearing.  CNS: CN 2-12 intact, power, tone and sensation normal throughout.       Assessment & Plan:  HTN (hypertension) Controlled, no change in medication DASH diet and commitment to daily physical activity for a minimum of 30 minutes discussed and encouraged, as a part of hypertension management. The importance of attaining a healthy weight is also discussed.   ALLERGIC RHINITIS, SEASONAL Increased as expected in early Spring. Daily use of medication for control is stressed  GERD Uncontrolled, needs medication which is covered by her ins, trial of dexilant  Morbid obesity Deteriorated. Patient re-educated about  the importance of commitment to a  minimum of 150 minutes of exercise per week. The importance of healthy food choices with portion control discussed. Encouraged to start a food diary, count calories and to consider  joining a support group. Sample diet sheets offered. Goals set by the patient for the next several months.     ANXIETY DISORDER, GENERALIZED Deteriorated, spouse has multiple medical conditions, also now has blended family with more children involved. Unable ot focus on lifestyle change for weight loss, wants to try medication to help with GAD which is a good decision  Unspecified vitamin D deficiency Improved, needs to continue supplement

## 2013-12-25 ENCOUNTER — Ambulatory Visit: Payer: BC Managed Care – PPO | Admitting: Family Medicine

## 2014-01-27 ENCOUNTER — Ambulatory Visit: Payer: BC Managed Care – PPO | Admitting: Family Medicine

## 2014-01-27 ENCOUNTER — Other Ambulatory Visit: Payer: Self-pay | Admitting: Family Medicine

## 2014-04-21 ENCOUNTER — Ambulatory Visit: Payer: BC Managed Care – PPO | Admitting: Family Medicine

## 2014-04-25 ENCOUNTER — Other Ambulatory Visit: Payer: Self-pay | Admitting: Family Medicine

## 2014-04-25 DIAGNOSIS — Z1231 Encounter for screening mammogram for malignant neoplasm of breast: Secondary | ICD-10-CM

## 2014-04-30 ENCOUNTER — Ambulatory Visit (HOSPITAL_COMMUNITY)
Admission: RE | Admit: 2014-04-30 | Discharge: 2014-04-30 | Disposition: A | Discharge status: Home / Self Care | Payer: BC Managed Care – PPO | Source: Ambulatory Visit | Attending: Family Medicine | Admitting: Family Medicine

## 2014-04-30 DIAGNOSIS — Z1231 Encounter for screening mammogram for malignant neoplasm of breast: Secondary | ICD-10-CM | POA: Insufficient documentation

## 2014-05-29 ENCOUNTER — Other Ambulatory Visit: Payer: Self-pay | Admitting: Family Medicine

## 2014-06-03 ENCOUNTER — Encounter: Payer: Self-pay | Admitting: Family Medicine

## 2014-06-03 ENCOUNTER — Ambulatory Visit (INDEPENDENT_AMBULATORY_CARE_PROVIDER_SITE_OTHER): Payer: BC Managed Care – PPO | Admitting: Family Medicine

## 2014-06-03 VITALS — BP 130/74 | HR 76 | Resp 18 | Ht <= 58 in | Wt 212.0 lb

## 2014-06-03 DIAGNOSIS — R7302 Impaired glucose tolerance (oral): Secondary | ICD-10-CM

## 2014-06-03 DIAGNOSIS — M25559 Pain in unspecified hip: Secondary | ICD-10-CM

## 2014-06-03 DIAGNOSIS — R7309 Other abnormal glucose: Secondary | ICD-10-CM

## 2014-06-03 DIAGNOSIS — Z13220 Encounter for screening for lipoid disorders: Secondary | ICD-10-CM

## 2014-06-03 DIAGNOSIS — F411 Generalized anxiety disorder: Secondary | ICD-10-CM

## 2014-06-03 DIAGNOSIS — J301 Allergic rhinitis due to pollen: Secondary | ICD-10-CM

## 2014-06-03 DIAGNOSIS — E559 Vitamin D deficiency, unspecified: Secondary | ICD-10-CM

## 2014-06-03 DIAGNOSIS — K219 Gastro-esophageal reflux disease without esophagitis: Secondary | ICD-10-CM

## 2014-06-03 DIAGNOSIS — I10 Essential (primary) hypertension: Secondary | ICD-10-CM

## 2014-06-03 DIAGNOSIS — M25552 Pain in left hip: Secondary | ICD-10-CM

## 2014-06-03 MED ORDER — ALPRAZOLAM 1 MG PO TABS: ORAL_TABLET | ORAL | Status: DC

## 2014-06-03 MED ORDER — CITALOPRAM HYDROBROMIDE 20 MG PO TABS: 20.0000 mg | ORAL_TABLET | Freq: Every day | ORAL | Status: DC

## 2014-06-03 MED ORDER — CLOTRIMAZOLE-BETAMETHASONE 1-0.05 % EX CREA: TOPICAL_CREAM | Freq: Two times a day (BID) | CUTANEOUS | Status: DC | PRN

## 2014-06-03 MED ORDER — AMLODIPINE BESYLATE 2.5 MG PO TABS: ORAL_TABLET | ORAL | Status: DC

## 2014-06-03 MED ORDER — NYSTATIN 100000 UNIT/GM EX POWD: Freq: Two times a day (BID) | CUTANEOUS | Status: DC

## 2014-06-03 MED ORDER — FLUTICASONE PROPIONATE 50 MCG/ACT NA SUSP: 2.0000 | Freq: Every day | NASAL | Status: DC | PRN

## 2014-06-03 NOTE — Patient Instructions (Signed)
F/u in 4 month, call if you need me before  Increase in citalopram dose staring today, take TWO 10mg  tablets till done, NEW dose is 20mg  ONE daily  Glad that allergies are good and thatt you are doing well overall  You are referred for orthopedic eval in [facility]  Fasting cbc, lipid, cmp, TSH, hBA1C and vit D October 28 or after  POSITIVE energy and thoughts help to move [place] in the direction that we want to go

## 2014-06-03 NOTE — Assessment & Plan Note (Signed)
Controlled, no change in medication DASH diet and commitment to daily physical activity for a minimum of 30 minutes discussed and encouraged, as a part of hypertension management. The importance of attaining a healthy weight is also discussed.  

## 2014-06-03 NOTE — Assessment & Plan Note (Signed)
Improved , but would likely benfit in increase in med dose, willstart 20mg  daily effective today and new script prescribed, also recommend commitment to daily exercise

## 2014-06-03 NOTE — Assessment & Plan Note (Signed)
Marked improvement  in control now that she has comitted to daily use of medications asprescribed

## 2014-06-03 NOTE — Progress Notes (Signed)
   Subjective:    Patient ID: [name] [name], female    DOB: 1970-10-06, 43 y.o.   MRN: [id number]  HPI The PT is here for follow up and re-evaluation of chronic medical conditions, medication management and review of any available recent lab and radiology data.  Preventive health is updated, specifically  Cancer screening and Immunization.   In July, pt experienced a "pop" when she bent over, since then has had left hip pain, got intra articular injection, still limited in climbing stairs, reaches an 8 after 4 flights whereas before was able to do 10, wants a 2nd opinion.The PT denies any adverse reactions to current medications since the last visit.  Concerned and frustrated with lack of weight loss, but has no intention of "giving up"    Review of Systems See HPI Denies recent fever or chills. Denies sinus pressure, nasal congestion, ear pain or sore throat. Denies chest congestion, productive cough or wheezing. Denies chest pains, palpitations and leg swelling Denies abdominal pain, nausea, vomiting,diarrhea or constipation.   Denies dysuria, frequency, hesitancy or incontinence. Denies joint pain, swelling and limitation in mobility. Denies headaches, seizures, numbness, or tingling. States anxiety is improved with the citalopram, however, still unable to really focus enough to commit to lifestyle changes to improve her health, interested in higher dose of medication, thinks this will help Denies skin break down or rash.        Objective:   Physical Exam BP 130/74  Pulse 76  Resp 18  Ht 4\' 10"  (1.473 m)  Wt 212 lb 0.1 oz (96.165 kg)  BMI 44.32 kg/m2  SpO2 99% Patient alert and oriented and in no cardiopulmonary distress.  HEENT: No facial asymmetry, EOMI,   oropharynx pink and moist.  Neck supple no JVD, no mass.  Chest: Clear to auscultation bilaterally.  CVS: S1, S2 no murmurs, no S3.Regular rate.  ABD: Soft non tender.   Ext: No edema  MS: Adequate ROM spine,  shoulders, hips and knees.  Skin: Intact, no ulcerations or rash noted.  Psych: Good eye contact, normal affect. Memory intact mildly anxious not  depressed appearing.  CNS: CN 2-12 intact, power,  normal throughout.no focal deficits noted.        Assessment & Plan:  HIP PAIN, LEFT 2 month h/o pain following acute pop on bending over, needs ortho re eval,  Has limitation in ability to climb stairs, referral entered to ortho in [place]  HTN (hypertension) Controlled, no change in medication DASH diet and commitment to daily physical activity for a minimum of 30 minutes discussed and encouraged, as a part of hypertension management. The importance of attaining a healthy weight is also discussed.   ALLERGIC RHINITIS, SEASONAL Marked improvement  in control now that she has comitted to daily use of medications asprescribed  GERD Controlled on OTC medication , continue same  ANXIETY DISORDER, GENERALIZED Improved , but would likely benfit in increase in med dose, willstart 20mg  daily effective today and new script prescribed, also recommend commitment to daily exercise  Morbid obesity Deteriorated. Patient re-educated about  the importance of commitment to a  minimum of 150 minutes of exercise per week. The importance of healthy food choices with portion control discussed. Encouraged pt to have a positive attitude as far as her ability to lose weight is concerned, and to "forgive herself " of past failures.

## 2014-06-03 NOTE — Assessment & Plan Note (Signed)
Controlled on OTC medication , continue same

## 2014-06-03 NOTE — Assessment & Plan Note (Addendum)
2 month h/o pain following acute pop on bending over, needs ortho re eval,  Has limitation in ability to climb stairs, referral entered to ortho in [place]

## 2014-06-03 NOTE — Assessment & Plan Note (Signed)
Deteriorated. Patient re-educated about  the importance of commitment to a  minimum of 150 minutes of exercise per week. The importance of healthy food choices with portion control discussed. Encouraged pt to have a positive attitude as far as her ability to lose weight is concerned, and to "forgive herself " of past failures.

## 2014-06-08 ENCOUNTER — Other Ambulatory Visit: Payer: Self-pay | Admitting: Family Medicine

## 2014-06-28 ENCOUNTER — Other Ambulatory Visit: Payer: Self-pay | Admitting: Family Medicine

## 2014-07-31 ENCOUNTER — Encounter: Payer: Self-pay | Admitting: Family Medicine

## 2014-07-31 ENCOUNTER — Ambulatory Visit (INDEPENDENT_AMBULATORY_CARE_PROVIDER_SITE_OTHER): Payer: BC Managed Care – PPO | Admitting: Family Medicine

## 2014-07-31 VITALS — BP 118/80 | HR 75 | Temp 99.5°F | Resp 16 | Ht <= 58 in | Wt 214.0 lb

## 2014-07-31 DIAGNOSIS — K219 Gastro-esophageal reflux disease without esophagitis: Secondary | ICD-10-CM

## 2014-07-31 DIAGNOSIS — I10 Essential (primary) hypertension: Secondary | ICD-10-CM

## 2014-07-31 DIAGNOSIS — J029 Acute pharyngitis, unspecified: Secondary | ICD-10-CM

## 2014-07-31 DIAGNOSIS — F411 Generalized anxiety disorder: Secondary | ICD-10-CM

## 2014-07-31 DIAGNOSIS — J209 Acute bronchitis, unspecified: Secondary | ICD-10-CM

## 2014-07-31 DIAGNOSIS — J208 Acute bronchitis due to other specified organisms: Secondary | ICD-10-CM

## 2014-07-31 LAB — POCT RAPID STREP A (OFFICE): RAPID STREP A SCREEN: NEGATIVE

## 2014-07-31 MED ORDER — PENICILLIN V POTASSIUM 500 MG PO TABS: 500.0000 mg | ORAL_TABLET | Freq: Three times a day (TID) | ORAL | Status: DC

## 2014-07-31 MED ORDER — BENZONATATE 100 MG PO CAPS: 100.0000 mg | ORAL_CAPSULE | Freq: Three times a day (TID) | ORAL | Status: DC | PRN

## 2014-07-31 MED ORDER — FIRST-DUKES MOUTHWASH MT SUSP: 10.0000 mL | Freq: Three times a day (TID) | OROMUCOSAL | Status: DC

## 2014-07-31 MED ORDER — CEFTRIAXONE SODIUM 500 MG IJ SOLR
500.0000 mg | Freq: Once | INTRAMUSCULAR | Status: AC
  Administered 2014-07-31: 500 mg via INTRAMUSCULAR

## 2014-07-31 MED ORDER — FLUCONAZOLE 150 MG PO TABS: 150.0000 mg | ORAL_TABLET | Freq: Once | ORAL | Status: DC

## 2014-07-31 NOTE — Progress Notes (Signed)
   Subjective:    Patient ID: [name] [name], female    DOB: 07-Jan-1971, 43 y.o.   MRN: [id number]  HPI 4  Day h/o  Excess cough and sore throat , also body aches and chills. Has worked, but worse last night. Sputum production today , brown  Throat is on fire Prior to this has been well, unfortunately , no weight loss, and states work responsibility is a lot, ahs not followed through regularly on lifestyle change   Review of Systems See HPI  Denies chest pains, palpitations and leg swelling Denies abdominal pain, nausea, vomiting,diarrhea or constipation.   Denies dysuria, frequency, hesitancy or incontinence. Denies joint pain, swelling and limitation in mobility. Denies headaches, seizures, numbness, or tingling. Denies depression, uncontrolled  anxiety or insomnia. Denies skin break down or rash.        Objective:   Physical Exam BP 118/80 mmHg  Pulse 75  Temp(Src) 99.5 F (37.5 C) (Oral)  Resp 16  Ht 4\' 10"  (1.473 m)  Wt 214 lb (97.07 kg)  BMI 44.74 kg/m2  SpO2 98% Patient alert and oriented and in no cardiopulmonary distress.Ill appearing  HEENT: No facial asymmetry, EOMI,   oropharynx erythematous, no exudate, and moist.  Neck supple no JVD, anterior cervical adenitis bilaterally.No sinus tenderness, TM clear bilaterally  Chest: .Adequate air entry, no crackles, no wheezes  CVS: S1, S2 no murmurs, no S3.Regular rate.  ABD: Soft non tender.   Ext: No edema  MS: Adequate ROM spine, shoulders, hips and knees.  Skin: Intact, no ulcerations or rash noted.  Psych: Good eye contact, normal affect. Memory intact not anxious or depressed appearing.  CNS: CN 2-12 intact, power,  normal throughout.no focal deficits noted.        Assessment & Plan:  Sore throat Rapid strep negative, pt very symptomatic, treat with PCN and Dukes, work excuse to return in am  Morbid obesity Unchanged Patient re-educated about  the importance of commitment to a  minimum of 150  minutes of exercise per week. The importance of healthy food choices with portion control discussed. Encouraged to start a food diary, count calories and to consider  joining a support group. Sample diet sheets offered. Goals set by the patient for the next several months.     Acute bronchitis due to infection Rocephn followed by pen V, pt to call back if worsening for CXR, work excuse x 1 day  Essential hypertension Controlled, no change in medication   GERD Controlled, no change in medication   ANXIETY DISORDER, GENERALIZED Controlled, no change in medication

## 2014-07-31 NOTE — Patient Instructions (Signed)
F/u as before, call if you need me before  You are being treated for acute bronchitis and pharyngitis  Rocephin 500 mg IM in office, and penicillin for 10 days, tessalon perles for 10 days, fluconazole in case of vaginal itch due to yeast, and dukes mouthwash for sore throat  Work excuse for today

## 2014-08-01 ENCOUNTER — Other Ambulatory Visit: Payer: Self-pay | Admitting: Family Medicine

## 2014-08-02 DIAGNOSIS — J029 Acute pharyngitis, unspecified: Secondary | ICD-10-CM | POA: Insufficient documentation

## 2014-08-02 DIAGNOSIS — J209 Acute bronchitis, unspecified: Secondary | ICD-10-CM | POA: Insufficient documentation

## 2014-08-02 DIAGNOSIS — J208 Acute bronchitis due to other specified organisms: Secondary | ICD-10-CM | POA: Insufficient documentation

## 2014-08-02 NOTE — Assessment & Plan Note (Signed)
Controlled, no change in medication  

## 2014-08-02 NOTE — Assessment & Plan Note (Signed)
Rocephn followed by pen V, pt to call back if worsening for CXR, work excuse x 1 day

## 2014-08-02 NOTE — Assessment & Plan Note (Signed)
Unchanged. Patient re-educated about  the importance of commitment to a  minimum of 150 minutes of exercise per week. The importance of healthy food choices with portion control discussed. Encouraged to start a food diary, count calories and to consider  joining a support group. Sample diet sheets offered. Goals set by the patient for the next several months.    

## 2014-08-02 NOTE — Assessment & Plan Note (Signed)
Rapid strep negative, pt very symptomatic, treat with PCN and Dukes, work excuse to return in am

## 2014-08-26 ENCOUNTER — Telehealth: Payer: Self-pay

## 2014-08-26 NOTE — Telephone Encounter (Signed)
Wants to know if you will send her in an for belviq because she can't take phentermine. Wants it sent to [facility]

## 2014-08-27 NOTE — Telephone Encounter (Signed)
She has had no labs sinmce 06/2013, has not had lab s ordered since 05/2014, that needs top be done first. She will also need updated weight in office before starting the med, which I am willing to prescribe for 1 month only, after labs done and she keeps Jan appt

## 2014-08-27 NOTE — Telephone Encounter (Signed)
Her weight was 212lb when she came here to weigh yesterday. Will do labs in the am

## 2014-08-28 ENCOUNTER — Other Ambulatory Visit: Payer: Self-pay | Admitting: Family Medicine

## 2014-08-29 ENCOUNTER — Other Ambulatory Visit: Payer: Self-pay

## 2014-08-29 LAB — COMPREHENSIVE METABOLIC PANEL
ALT: 8 U/L (ref 0–35)
AST: 14 U/L (ref 0–37)
Albumin: 3.8 g/dL (ref 3.5–5.2)
Alkaline Phosphatase: 75 U/L (ref 39–117)
BILIRUBIN TOTAL: 0.4 mg/dL (ref 0.2–1.2)
BUN: 12 mg/dL (ref 6–23)
CHLORIDE: 106 meq/L (ref 96–112)
CO2: 25 mEq/L (ref 19–32)
Calcium: 9.4 mg/dL (ref 8.4–10.5)
Creat: 0.79 mg/dL (ref 0.50–1.10)
GLUCOSE: 80 mg/dL (ref 70–99)
Potassium: 4.5 mEq/L (ref 3.5–5.3)
SODIUM: 137 meq/L (ref 135–145)
TOTAL PROTEIN: 7.1 g/dL (ref 6.0–8.3)

## 2014-08-29 LAB — VITAMIN D 25 HYDROXY (VIT D DEFICIENCY, FRACTURES): Vit D, 25-Hydroxy: 23 ng/mL — ABNORMAL LOW (ref 30–100)

## 2014-08-29 LAB — CBC
HEMATOCRIT: 39.6 % (ref 36.0–46.0)
HEMOGLOBIN: 13.1 g/dL (ref 12.0–15.0)
MCH: 29.1 pg (ref 26.0–34.0)
MCHC: 33.1 g/dL (ref 30.0–36.0)
MCV: 88 fL (ref 78.0–100.0)
MPV: 9.8 fL (ref 9.4–12.4)
Platelets: 375 10*3/uL (ref 150–400)
RBC: 4.5 MIL/uL (ref 3.87–5.11)
RDW: 13.2 % (ref 11.5–15.5)
WBC: 6.2 10*3/uL (ref 4.0–10.5)

## 2014-08-29 LAB — LIPID PANEL
Cholesterol: 162 mg/dL (ref 0–200)
HDL: 64 mg/dL (ref >39)
LDL CALC: 85 mg/dL (ref 0–99)
TRIGLYCERIDES: 64 mg/dL (ref <150)
Total CHOL/HDL Ratio: 2.5 Ratio
VLDL: 13 mg/dL (ref 0–40)

## 2014-08-29 LAB — TSH: TSH: 0.323 u[IU]/mL — ABNORMAL LOW (ref 0.350–4.500)

## 2014-08-29 LAB — T4, FREE: Free T4: 1.18 ng/dL (ref 0.80–1.80)

## 2014-08-29 LAB — T3, FREE: T3, Free: 3.4 pg/mL (ref 2.3–4.2)

## 2014-08-29 LAB — HEMOGLOBIN A1C
Hgb A1c MFr Bld: 5.7 % — ABNORMAL HIGH (ref <5.7)
Mean Plasma Glucose: 117 mg/dL — ABNORMAL HIGH (ref <117)

## 2014-08-29 MED ORDER — VITAMIN D (ERGOCALCIFEROL) 1.25 MG (50000 UNIT) PO CAPS: 50000.0000 [IU] | ORAL_CAPSULE | ORAL | Status: DC

## 2014-09-01 ENCOUNTER — Other Ambulatory Visit: Payer: Self-pay

## 2014-09-01 MED ORDER — LORCASERIN HCL 10 MG PO TABS: 10.0000 mg | ORAL_TABLET | Freq: Two times a day (BID) | ORAL | Status: DC

## 2014-10-02 ENCOUNTER — Encounter: Payer: Self-pay | Admitting: Family Medicine

## 2014-10-02 ENCOUNTER — Ambulatory Visit: Payer: BC Managed Care – PPO | Admitting: Family Medicine

## 2014-11-06 ENCOUNTER — Telehealth: Payer: Self-pay | Admitting: *Deleted

## 2014-11-06 NOTE — Telephone Encounter (Signed)
Pt aware to send [place] a refill request next month

## 2014-11-06 NOTE — Telephone Encounter (Signed)
Pt called stating she just refilled her medication and she will be due again 12/04/14. Pt has a appt for3/29/2016 at 2:45, pt would like enough refills to make it to that appt. Please advise

## 2014-11-21 ENCOUNTER — Other Ambulatory Visit: Payer: Self-pay | Admitting: Family Medicine

## 2014-12-02 ENCOUNTER — Other Ambulatory Visit: Payer: Self-pay | Admitting: Family Medicine

## 2014-12-16 ENCOUNTER — Ambulatory Visit (INDEPENDENT_AMBULATORY_CARE_PROVIDER_SITE_OTHER): Payer: BC Managed Care – PPO | Admitting: Family Medicine

## 2014-12-16 VITALS — BP 118/78 | HR 88 | Resp 16 | Ht <= 58 in | Wt 212.1 lb

## 2014-12-16 DIAGNOSIS — I10 Essential (primary) hypertension: Secondary | ICD-10-CM

## 2014-12-16 DIAGNOSIS — R7302 Impaired glucose tolerance (oral): Secondary | ICD-10-CM

## 2014-12-16 DIAGNOSIS — M25562 Pain in left knee: Secondary | ICD-10-CM | POA: Diagnosis not present

## 2014-12-16 DIAGNOSIS — R002 Palpitations: Secondary | ICD-10-CM

## 2014-12-16 DIAGNOSIS — J301 Allergic rhinitis due to pollen: Secondary | ICD-10-CM

## 2014-12-16 DIAGNOSIS — F411 Generalized anxiety disorder: Secondary | ICD-10-CM

## 2014-12-16 MED ORDER — LORCASERIN HCL 10 MG PO TABS: 10.0000 mg | ORAL_TABLET | Freq: Two times a day (BID) | ORAL | Status: DC

## 2014-12-16 NOTE — Patient Instructions (Addendum)
F/U in 4 month, call if you need me before  HBa1C, , , chem 7, TSH in 4 month, non fasting   You are referred to nutritionist and to dr Alfonso Ramus  It is important that you exercise regularly at least 30 minutes 5 times a week. If you develop chest pain, have severe difficulty breathing, or feel very tired, stop exercising immediately and seek medical attention   Practice relaxation techniques, consider therapy.  Resume citalopram and take belviq as prescribed    Weight loss goal of 3 pounds per month

## 2014-12-16 NOTE — Assessment & Plan Note (Addendum)
Popping and instability x 1 month, refer to ortho for eval and management deemed necessary

## 2014-12-17 ENCOUNTER — Other Ambulatory Visit: Payer: Self-pay

## 2014-12-17 DIAGNOSIS — J301 Allergic rhinitis due to pollen: Secondary | ICD-10-CM

## 2014-12-17 DIAGNOSIS — F411 Generalized anxiety disorder: Secondary | ICD-10-CM

## 2014-12-17 MED ORDER — AMLODIPINE BESYLATE 2.5 MG PO TABS: ORAL_TABLET | ORAL | Status: DC

## 2014-12-17 MED ORDER — NYSTATIN 100000 UNIT/GM EX POWD: Freq: Two times a day (BID) | CUTANEOUS | Status: DC

## 2014-12-17 MED ORDER — CITALOPRAM HYDROBROMIDE 20 MG PO TABS
20.0000 mg | ORAL_TABLET | Freq: Every day | ORAL | Status: DC
Start: 2014-12-17 — End: 2015-03-30

## 2014-12-17 MED ORDER — LORATADINE 10 MG PO TABS
10.0000 mg | ORAL_TABLET | Freq: Every day | ORAL | Status: DC
Start: 2014-12-17 — End: 2015-03-30

## 2014-12-17 MED ORDER — FLUTICASONE PROPIONATE 50 MCG/ACT NA SUSP: NASAL | Status: DC

## 2014-12-17 MED ORDER — CLOTRIMAZOLE-BETAMETHASONE 1-0.05 % EX CREA: TOPICAL_CREAM | Freq: Two times a day (BID) | CUTANEOUS | Status: DC | PRN

## 2014-12-27 ENCOUNTER — Other Ambulatory Visit: Payer: Self-pay | Admitting: Family Medicine

## 2015-01-10 ENCOUNTER — Encounter: Payer: Self-pay | Admitting: Family Medicine

## 2015-01-10 DIAGNOSIS — R7302 Impaired glucose tolerance (oral): Secondary | ICD-10-CM | POA: Insufficient documentation

## 2015-01-10 NOTE — Assessment & Plan Note (Signed)
Improved on  Current med, therapy  Recommended, no interest currently

## 2015-01-10 NOTE — Assessment & Plan Note (Signed)
Controlled, no change in medication  

## 2015-01-10 NOTE — Assessment & Plan Note (Signed)
Deteriorated. Patient re-educated about  the importance of commitment to a  minimum of 150 minutes of exercise per week.  The importance of healthy food choices with portion control discussed. Encouraged to start a food diary, count calories and to consider  joining a support group. Sample diet sheets offered. Goals set by the patient for the next several months. Start belviq , would benfit from nutrition consult and therapy, not interested in either at this time   Weight /BMI 12/16/2014 07/31/2014 06/03/2014  WEIGHT 212 lb 1.9 oz 214 lb 212 lb 0.1 oz  HEIGHT 4\' 10"  4\' 10"  4\' 10"   BMI 44.35 kg/m2 44.74 kg/m2 44.32 kg/m2    Current exercise per week 45 minutes.

## 2015-01-10 NOTE — Progress Notes (Signed)
[name] [name]     MRN: [id number]      DOB: May 26, 1971   HPI Ms. Mchatton is here for follow up and re-evaluation of chronic medical conditions, medication management and review of any available recent lab and radiology data.  Preventive health is updated, specifically  Cancer screening and Immunization.   Questions or concerns regarding consultations or procedures which the PT has had in the interim are  addressed. The PT denies any adverse reactions to current medications since the last visit.  C/o increased and uncontrolled left knee pain with instability, wants ortho eval Concerned about weight wants to try another appetite suppressant for help, no interest in nutrition ed or therapist she would benefit greatly for both. Reports lack of control and discipline in eating and a lot of emotional eating  ROS Denies recent fever or chills. Denies sinus pressure, nasal congestion, ear pain or sore throat. Denies chest congestion, productive cough or wheezing. Denies chest pains, palpitations and leg swelling Denies abdominal pain, nausea, vomiting,diarrhea or constipation.   Denies dysuria, frequency, hesitancy or incontinence.  Denies headaches, seizures, numbness, or tingling. Denies uncontrolled  depression, anxiety or insomnia. Denies skin break down or rash.   PE  BP 118/78 mmHg  Pulse 88  Resp 16  Ht 4\' 10"  (1.473 m)  Wt 212 lb 1.9 oz (96.217 kg)  BMI 44.35 kg/m2  SpO2 99%  Patient alert and oriented and in no cardiopulmonary distress.  HEENT: No facial asymmetry, EOMI,   oropharynx pink and moist.  Neck supple no JVD, no mass.  Chest: Clear to auscultation bilaterally.  CVS: S1, S2 no murmurs, no S3.Regular rate.  ABD: Soft non tender.   Ext: No edema  MS: Adequate ROM spine, shoulders, hips and markedly reduced in left  knee.  Skin: Intact, no ulcerations or rash noted.  Psych: Good eye contact, normal affect. Memory intact not anxious or depressed  appearing.  CNS: CN 2-12 intact, power,  normal throughout.no focal deficits noted.   Assessment & Plan   Knee pain, left Popping and instability x 1 month, refer to ortho for eval and management deemed necessary   Essential hypertension Controlled, no change in medication DASH diet and commitment to daily physical activity for a minimum of 30 minutes discussed and encouraged, as a part of hypertension management. The importance of attaining a healthy weight is also discussed.  BP/Weight 12/16/2014 07/31/2014 06/03/2014 12/19/2013 09/05/2013 07/17/2013 75/06/2584  Systolic BP 277 824 235 361 443 154 008  Diastolic BP 78 80 74 80 90 66 80  Wt. (Lbs) 212.12 214 212.01 214.08 203.7 202.4 203.12  BMI 44.35 44.74 44.32 44.75 42.59 42.31 42.46         ALLERGIC RHINITIS, SEASONAL Increased symptoms with season change, re educated re need to use medication daily for control   Morbid obesity Deteriorated. Patient re-educated about  the importance of commitment to a  minimum of 150 minutes of exercise per week.  The importance of healthy food choices with portion control discussed. Encouraged to start a food diary, count calories and to consider  joining a support group. Sample diet sheets offered. Goals set by the patient for the next several months. Start belviq , would benfit from nutrition consult and therapy, not interested in either at this time   Weight /BMI 12/16/2014 07/31/2014 06/03/2014  WEIGHT 212 lb 1.9 oz 214 lb 212 lb 0.1 oz  HEIGHT 4\' 10"  4\' 10"  4\' 10"   BMI 44.35 kg/m2 44.74 kg/m2 44.32  kg/m2    Current exercise per week 45 minutes.    PALPITATIONS Denies any recent episodes   ANXIETY DISORDER, GENERALIZED Improved on  Current med, therapy  Recommended, no interest currently   GERD Controlled, no change in medication

## 2015-01-10 NOTE — Assessment & Plan Note (Signed)
Controlled, no change in medication DASH diet and commitment to daily physical activity for a minimum of 30 minutes discussed and encouraged, as a part of hypertension management. The importance of attaining a healthy weight is also discussed.  BP/Weight 12/16/2014 07/31/2014 06/03/2014 12/19/2013 09/05/2013 07/17/2013 96/22/2979  Systolic BP 892 119 417 408 144 818 563  Diastolic BP 78 80 74 80 90 66 80  Wt. (Lbs) 212.12 214 212.01 214.08 203.7 202.4 203.12  BMI 44.35 44.74 44.32 44.75 42.59 42.31 42.46

## 2015-01-10 NOTE — Assessment & Plan Note (Signed)
Denies any recent episodes 

## 2015-01-10 NOTE — Assessment & Plan Note (Signed)
Increased symptoms with season change, re educated re need to use medication daily for control

## 2015-02-02 ENCOUNTER — Other Ambulatory Visit: Payer: Self-pay

## 2015-02-02 MED ORDER — ALPRAZOLAM 1 MG PO TABS: 1.0000 mg | ORAL_TABLET | Freq: Every evening | ORAL | Status: DC | PRN

## 2015-03-25 ENCOUNTER — Other Ambulatory Visit: Payer: Self-pay

## 2015-03-25 MED ORDER — VITAMIN D (ERGOCALCIFEROL) 1.25 MG (50000 UNIT) PO CAPS: ORAL_CAPSULE | ORAL | Status: DC

## 2015-03-30 ENCOUNTER — Telehealth: Payer: Self-pay | Admitting: *Deleted

## 2015-03-30 DIAGNOSIS — F411 Generalized anxiety disorder: Secondary | ICD-10-CM

## 2015-03-30 DIAGNOSIS — J301 Allergic rhinitis due to pollen: Secondary | ICD-10-CM

## 2015-03-30 MED ORDER — FLUTICASONE PROPIONATE 50 MCG/ACT NA SUSP: NASAL | Status: DC

## 2015-03-30 MED ORDER — OMEPRAZOLE 40 MG PO CPDR: DELAYED_RELEASE_CAPSULE | ORAL | Status: DC

## 2015-03-30 MED ORDER — AMLODIPINE BESYLATE 2.5 MG PO TABS: ORAL_TABLET | ORAL | Status: DC

## 2015-03-30 MED ORDER — CITALOPRAM HYDROBROMIDE 20 MG PO TABS
20.0000 mg | ORAL_TABLET | Freq: Every day | ORAL | Status: DC
Start: 2015-03-30 — End: 2015-05-04

## 2015-03-30 MED ORDER — LORATADINE 10 MG PO TABS: 10.0000 mg | ORAL_TABLET | Freq: Every day | ORAL | Status: DC

## 2015-03-30 NOTE — Telephone Encounter (Signed)
Pt has a appt 05/05/15 at 1:45 pt is requesting her medications to be refilled until the appt. Please advise

## 2015-03-30 NOTE — Telephone Encounter (Signed)
meds refilled 

## 2015-05-04 ENCOUNTER — Ambulatory Visit (INDEPENDENT_AMBULATORY_CARE_PROVIDER_SITE_OTHER): Payer: BC Managed Care – PPO | Admitting: Family Medicine

## 2015-05-04 ENCOUNTER — Encounter: Payer: Self-pay | Admitting: Family Medicine

## 2015-05-04 VITALS — BP 114/78 | HR 78 | Resp 18 | Ht <= 58 in | Wt 211.0 lb

## 2015-05-04 DIAGNOSIS — J301 Allergic rhinitis due to pollen: Secondary | ICD-10-CM

## 2015-05-04 DIAGNOSIS — D219 Benign neoplasm of connective and other soft tissue, unspecified: Secondary | ICD-10-CM | POA: Insufficient documentation

## 2015-05-04 DIAGNOSIS — K219 Gastro-esophageal reflux disease without esophagitis: Secondary | ICD-10-CM

## 2015-05-04 DIAGNOSIS — F411 Generalized anxiety disorder: Secondary | ICD-10-CM | POA: Diagnosis not present

## 2015-05-04 DIAGNOSIS — I10 Essential (primary) hypertension: Secondary | ICD-10-CM | POA: Diagnosis not present

## 2015-05-04 DIAGNOSIS — Z13220 Encounter for screening for lipoid disorders: Secondary | ICD-10-CM

## 2015-05-04 MED ORDER — VITAMIN D (ERGOCALCIFEROL) 1.25 MG (50000 UNIT) PO CAPS: 50000.0000 [IU] | ORAL_CAPSULE | ORAL | Status: DC

## 2015-05-04 MED ORDER — ALPRAZOLAM 1 MG PO TABS: 1.0000 mg | ORAL_TABLET | Freq: Every evening | ORAL | Status: DC | PRN

## 2015-05-04 MED ORDER — CITALOPRAM HYDROBROMIDE 20 MG PO TABS: 20.0000 mg | ORAL_TABLET | Freq: Every day | ORAL | Status: DC

## 2015-05-04 MED ORDER — LORCASERIN HCL 10 MG PO TABS: 1.0000 | ORAL_TABLET | Freq: Two times a day (BID) | ORAL | Status: DC

## 2015-05-04 MED ORDER — AMLODIPINE BESYLATE 2.5 MG PO TABS: ORAL_TABLET | ORAL | Status: DC

## 2015-05-04 MED ORDER — PANTOPRAZOLE SODIUM 40 MG PO TBEC: 40.0000 mg | DELAYED_RELEASE_TABLET | Freq: Every day | ORAL | Status: DC

## 2015-05-04 NOTE — Patient Instructions (Addendum)
F/u in 4 month, call if you need me before  Bariatric Surgery Information Severe obesity is difficult to treat through diet and exercise alone. Bariatric surgery (also called weight loss surgery) is an option for people who are severely obese and cannot lose weight by traditional means, or who suffer from serious obesity-related health problems. The surgery promotes weight loss by decreasing the absorption of food and, in some cases, by interrupting the digestive process. As in other treatments for obesity, best results are achieved with healthy eating behaviors and regular physical activity.  People who may consider bariatric surgery include those with a body mass index (BMI) above 40. Men with a BMI of 40 are about 100 lb (45 kg) overweight and women with this BMI are about 80 lb (36 kg) overweight. People with a BMI between 35 and 40 and who suffer from type 2 diabetes or life-threatening heart and lung (cardiopulmonary) problems, such as severe sleep apnea or obesity-related heart disease, may also be candidates for surgery.  THE NORMAL DIGESTIVE PROCESS Normally, as food moves along the digestive tract, digestive juices and enzymes digest and absorb calories and nutrients. After we chew and swallow our food, it moves down the esophagus to the stomach. There a strong acid continues the digestive process. When the stomach contents move to the first portion of the small intestine (duodenum), bile and pancreatic juice speed up digestion. The jejunum and ileum are the remaining two segments of the small intestine. They complete the absorption of almost all calories and nutrients. The food particles that cannot be digested in the small intestine are stored in the large intestine until they are eliminated.  HOW DOES SURGERY PROMOTE WEIGHT LOSS? Bariatric surgery alters the digestive process. The surgery closes off parts of the stomach to make it smaller, restricting the amount of food the stomach can hold.  There are two types of bariatric surgeries: restrictive surgeries and malabsorptive surgeries. Restrictive surgeries only reduce stomach size. They do not interfere with the normal digestive process. Malabsorptive surgeries combine stomach restriction with a partial bypass of the small intestine. These types of procedures create a direct connection from the stomach to the lower segment of the small intestine. The connection causes food to bypass the portions of the digestive tract that absorb calories and nutrients.Malabsorptive surgeries are the most common surgeries for weight loss. They restrict both food intake and the amount of calories and nutrients the body absorbs.  Restrictive surgeries lead to weight loss in almost all patients. But they are less successful than malabsorptive surgeries in achieving substantial, long-term weight loss. Some patients regain weight. Others are unable to adjust their eating habits and fail to lose the desired weight. Successful results depend on the patient's willingness to adopt a long-term plan of healthy eating and regular physical activity.  RESTRICTIVE SURGERY To perform a restrictive surgery, health care providers create a small pouch at the top of the stomach where food enters from the esophagus. At first, the pouch holds about 1 oz (28 g) of food. It later expands to hold 2-3 oz (56-84 g). The lower outlet of the pouch usually has a diameter of about  inch (1.9 cm). This small outlet delays the emptying of food from the pouch and causes a feeling of fullness. As a result of this surgery, most people lose the ability to eat large amounts of food at one time. After the surgery, the person usually can eat only  to 1 c (about 2 L) of  food without discomfort or nausea. Also, food has to be well chewed.  There are several types of procedures that create this pouch. Adjustable Gastric Banding In this procedure, a hollow band is placed around the stomach near its  upper end. This creates a small pouch and a narrow passage into the larger remainder of the stomach. The band is then inflated with a salt solution. It can be tightened or loosened over time to change the size of the passage by increasing or decreasing the amount of salt solution.  The band is adjusted based on feelings of hunger and weight loss. Patients decide when they need an adjustment and come to their surgeons to be evaluated. The adjustment is done as an office visit. The band is fully reversible with a second surgery if the patient changes his or her mind. There is no cutting or rerouting of the intestine.  Vertical Banded Gastroplasty This is the most common restrictive surgery for weight control. Both a band and staples are used to create a small stomach pouch. Vertical banded gastroplasty is based on the same principle of restriction as adjustable gastric banding, but the stomach is surgically altered with the stapling. This treatment is not reversible.  About 30% of those who undergo vertical banded gastroplasty achieve normal weight. About 80% achieve some degree of weight loss. MALABSORPTIVE SURGERY Malabsorptive surgeries produce more weight loss than restrictive surgeries. And they are more effective in reversing the health problems associated with severe obesity. Patients who have malabsorptive surgeries generally lose two-thirds of their excess weight within 2 years. There are several types of malabsorptive surgeries. Each one carries its own benefits and risks.  Roux-en-Y Gastric Bypass (RGB) This surgery is the most common and successful malabsorptive surgery. First, a small stomach pouch is created to restrict food intake. Next, a y-shaped section of the small intestine is attached to the pouch. This allows food to bypass the lower stomach, the duodenum, and the first portion of jejunum. This reduces the amount of calories and nutrients the body absorbs.  Biliopancreatic diversion  (BPD) In this more complicated malabsorptive surgery, portions of the stomach are removed. The small pouch that remains is connected directly to the final segment of the small intestine, completely bypassing the duodenum and the jejunum.  This procedure successfully promotes weight loss. But it is less frequently used than other types of surgery because of the high risk for nutritional deficiencies. A variation of this procedure includes a "duodenal switch." This leaves a larger portion of the stomach intact, including the valve that regulates the release of stomach contents into the small intestine (pyloric valve). It also keeps a small part of the duodenum in the digestive pathway.  WHAT ARE THE BENEFITS AND RISKS OF BARIATRIC SURGERY? General Benefits  Right after surgery, most patients lose weight quickly. They continue to lose weight for 18-24 months after the procedure. Most patients regain 5-10% of the weight they lost, but many maintain a long-term weight loss of about 100 lb (45 kg).   Most obesity-related conditions, such as abnormal blood sugar levels, improve after the surgery.  General Risks  Infection.  Abdominal hernias.   Breakdown of the staple line.   Stretched stomach outlets.  Development of gallstones. These are clumps of cholesterol and other matter that form in the gallbladder. During quick or substantial weight loss, one's risk of developing gallstones increases.   Nutritional deficiencies. Nearly 30% of patients who have bariatric surgery develop nutritional deficiencies. These include anemia,  osteoporosis, and metabolic bone disease.  A leak from any of the surgical connections (anastomoses). This is life-threatening. The more involved the surgery, the more risk involved.   Inability to lose weight or weight gain. Patients who do not follow a strict diet will stretch out their stomach pouches and they will not lose weight.   Dumping syndrome. This occurs  when stomach contents move too rapidly through the small intestine causing cramping, diarrhea, nausea, palpitations, sweating, bloating, and dizziness or fainting.  Specific Risks of Restrictive Surgeries  Vomiting. This occurs when the small stomach is overly stretched by food particles that have not been chewed well.   Band slippage and saline leakage. This may occur after adjustable gastric banding.   Band erosion into the lumen of the stomach.   Wearing away of the band and breakdown of the staple line. This may occur after vertical banded gastroplasty. In a small number of cases, stomach juices may leak into the abdomen. If this happens, an emergency surgery is needed.   Death from complications (rare). This happens in only about 1% of cases.   Stomach prolapse. Specific Risks of Malabsorptive Surgeries In addition to the risks of restrictive surgeries, malabsorptive surgeries also carry greater risk for nutritional deficiencies. This is because the procedure causes food to bypass the duodenum and jejunum. That is where most iron and calcium are absorbed. The more extensive the bypass, the greater the risk is for complications and nutritional deficiencies, including:    Anemia.  Osteoporosis.   Metabolic bone disease. Follow-up surgeries to correct complications are needed in about 10-20% of patients.  FOR MORE INFORMATION American Society for Metabolic & Bariatric Surgery: www.asmbs.org  Weight-control Information Network (WIN): win.AmenCredit.is Document Released: 09/05/2005 Document Revised: 09/10/2013 Document Reviewed: 03/05/2013 [facility] Patient Information 2015 [place], [place]. This information is not intended to replace advice given to you by your health care provider. Make sure you discuss any questions you have with your health care provider.  Meds as discussed

## 2015-05-04 NOTE — Progress Notes (Signed)
   Subjective:    Patient ID: [name] [name], female    DOB: May 09, 1971, 44 y.o.   MRN: [id number]  HPI The PT is here for follow up and re-evaluation of chronic medical conditions, medication management and review of any available recent lab and radiology data.  Preventive health is updated, specifically  Cancer screening and Immunization.   Questions or concerns regarding consultations or procedures which the PT has had in the interim are  addressed. The PT denies any adverse reactions to current medications since the last visit.  There are no new concerns. Frustrated with failure to lose weight.There are no specific complaints      Review of Systems See HPI Denies recent fever or chills. Denies sinus pressure, nasal congestion, ear pain or sore throat. Denies chest congestion, productive cough or wheezing. Denies chest pains, palpitations and leg swelling Denies abdominal pain, nausea, vomiting,diarrhea or constipation.   Denies dysuria, frequency, hesitancy or incontinence. Denies joint pain, swelling and limitation in mobility. Denies headaches, seizures, numbness, or tingling. Denies uncontrolled depression, anxiety or insomnia. Denies skin break down or rash.         Objective:   Physical Exam  BP 114/78 mmHg  Pulse 78  Resp 18  Ht 4\' 10"  (1.473 m)  Wt 211 lb 0.1 oz (95.712 kg)  BMI 44.11 kg/m2  SpO2 99%  Patient alert and oriented and in no cardiopulmonary distress.  HEENT: No facial asymmetry, EOMI,   oropharynx pink and moist.  Neck supple no JVD, no mass.  Chest: Clear to auscultation bilaterally.  CVS: S1, S2 no murmurs, no S3.Regular rate.  ABD: Soft non tender.   Ext: No edema  MS: Adequate ROM spine, shoulders, hips and knees.  Skin: Intact, no ulcerations or rash noted.  Psych: Good eye contact, normal affect. Memory intact not anxious or depressed appearing.  CNS: CN 2-12 intact, power,  normal throughout.no focal deficits noted.'        Assessment & Plan:  Essential hypertension Controlled, no change in medication DASH diet and commitment to daily physical activity for a minimum of 30 minutes discussed and encouraged, as a part of hypertension management. The importance of attaining a healthy weight is also discussed.  BP/Weight 05/04/2015 12/16/2014 07/31/2014 06/03/2014 12/19/2013 09/05/2013 40/34/7425  Systolic BP 956 387 564 332 951 884 166  Diastolic BP 78 78 80 74 80 90 66  Wt. (Lbs) 211.01 212.12 214 212.01 214.08 203.7 202.4  BMI 44.11 44.35 44.74 44.32 44.75 42.59 42.31        ALLERGIC RHINITIS, SEASONAL Improved control now that she is compliant with medications, she is to continue same  GERD Preferred Med cost a challenge, protonix prescribed  ANXIETY DISORDER, GENERALIZED Controlled, no change in medication   Morbid obesity Deteriorated.Pt encouraged to seriously consider bariatric surgery and info provided. Patient re-educated about  the importance of commitment to a  minimum of 150 minutes of exercise per week.  The importance of healthy food choices with portion control discussed. Encouraged to start a food diary, count calories and to consider  joining a support group. Sample diet sheets offered. Goals set by the patient for the next several months.   Weight /BMI 05/04/2015 12/16/2014 07/31/2014  WEIGHT 211 lb 0.1 oz 212 lb 1.9 oz 214 lb  HEIGHT 4\' 10"  4\' 10"  4\' 10"   BMI 44.11 kg/m2 44.35 kg/m2 44.74 kg/m2    Current exercise per week 90 minutes.

## 2015-05-05 ENCOUNTER — Ambulatory Visit: Payer: BC Managed Care – PPO | Admitting: Family Medicine

## 2015-05-22 ENCOUNTER — Other Ambulatory Visit: Payer: Self-pay | Admitting: Family Medicine

## 2015-05-22 DIAGNOSIS — Z1231 Encounter for screening mammogram for malignant neoplasm of breast: Secondary | ICD-10-CM

## 2015-05-28 ENCOUNTER — Other Ambulatory Visit: Payer: Self-pay | Admitting: Obstetrics and Gynecology

## 2015-05-28 DIAGNOSIS — N6310 Unspecified lump in the right breast, unspecified quadrant: Secondary | ICD-10-CM

## 2015-05-31 NOTE — Assessment & Plan Note (Addendum)
Deteriorated.Pt encouraged to seriously consider bariatric surgery and info provided. Patient re-educated about  the importance of commitment to a  minimum of 150 minutes of exercise per week.  The importance of healthy food choices with portion control discussed. Encouraged to start a food diary, count calories and to consider  joining a support group. Sample diet sheets offered. Goals set by the patient for the next several months.   Weight /BMI 05/04/2015 12/16/2014 07/31/2014  WEIGHT 211 lb 0.1 oz 212 lb 1.9 oz 214 lb  HEIGHT 4\' 10"  4\' 10"  4\' 10"   BMI 44.11 kg/m2 44.35 kg/m2 44.74 kg/m2    Current exercise per week 90 minutes.

## 2015-05-31 NOTE — Assessment & Plan Note (Signed)
Controlled, no change in medication DASH diet and commitment to daily physical activity for a minimum of 30 minutes discussed and encouraged, as a part of hypertension management. The importance of attaining a healthy weight is also discussed.  BP/Weight 05/04/2015 12/16/2014 07/31/2014 06/03/2014 12/19/2013 09/05/2013 85/92/7639  Systolic BP 432 003 794 446 190 122 241  Diastolic BP 78 78 80 74 80 90 66  Wt. (Lbs) 211.01 212.12 214 212.01 214.08 203.7 202.4  BMI 44.11 44.35 44.74 44.32 44.75 42.59 42.31

## 2015-05-31 NOTE — Assessment & Plan Note (Signed)
Preferred Med cost a challenge, protonix prescribed

## 2015-05-31 NOTE — Assessment & Plan Note (Signed)
Controlled, no change in medication  

## 2015-05-31 NOTE — Assessment & Plan Note (Signed)
Improved control now that she is compliant with medications, she is to continue same

## 2015-06-03 ENCOUNTER — Ambulatory Visit
Admission: RE | Admit: 2015-06-03 | Discharge: 2015-06-03 | Disposition: A | Discharge status: Home / Self Care | Payer: BC Managed Care – PPO | Source: Ambulatory Visit | Attending: Obstetrics and Gynecology | Admitting: Obstetrics and Gynecology

## 2015-06-03 DIAGNOSIS — N6310 Unspecified lump in the right breast, unspecified quadrant: Secondary | ICD-10-CM

## 2015-06-23 ENCOUNTER — Telehealth: Payer: Self-pay

## 2015-06-23 NOTE — Telephone Encounter (Signed)
States since Saturday she has had a sore throat and ringing in her right ear (no pain) achy all over but not much fever that she can tell. Coughing and some hoarsness and mucus in her throat.Feels bad. Scared to take much OTC meds due to her htn. Wanted to be seen. Please advise

## 2015-06-23 NOTE — Telephone Encounter (Signed)
Patient aware and scheduled with 0930 in the am

## 2015-06-23 NOTE — Telephone Encounter (Signed)
OV in am, please

## 2015-06-24 ENCOUNTER — Encounter: Payer: Self-pay | Admitting: Family Medicine

## 2015-06-24 ENCOUNTER — Ambulatory Visit (INDEPENDENT_AMBULATORY_CARE_PROVIDER_SITE_OTHER): Payer: BC Managed Care – PPO | Admitting: Family Medicine

## 2015-06-24 VITALS — BP 122/80 | HR 85 | Temp 98.9°F | Resp 16 | Ht <= 58 in | Wt 209.0 lb

## 2015-06-24 DIAGNOSIS — J302 Other seasonal allergic rhinitis: Secondary | ICD-10-CM | POA: Diagnosis not present

## 2015-06-24 DIAGNOSIS — J029 Acute pharyngitis, unspecified: Secondary | ICD-10-CM | POA: Insufficient documentation

## 2015-06-24 DIAGNOSIS — J301 Allergic rhinitis due to pollen: Secondary | ICD-10-CM | POA: Diagnosis not present

## 2015-06-24 DIAGNOSIS — J209 Acute bronchitis, unspecified: Secondary | ICD-10-CM | POA: Diagnosis not present

## 2015-06-24 DIAGNOSIS — H6591 Unspecified nonsuppurative otitis media, right ear: Secondary | ICD-10-CM | POA: Insufficient documentation

## 2015-06-24 DIAGNOSIS — I10 Essential (primary) hypertension: Secondary | ICD-10-CM

## 2015-06-24 DIAGNOSIS — F411 Generalized anxiety disorder: Secondary | ICD-10-CM

## 2015-06-24 LAB — POCT RAPID STREP A (OFFICE): Rapid Strep A Screen: NEGATIVE

## 2015-06-24 MED ORDER — PROMETHAZINE-DM 6.25-15 MG/5ML PO SYRP: 5.0000 mL | ORAL_SOLUTION | Freq: Every evening | ORAL | Status: DC | PRN

## 2015-06-24 MED ORDER — BENZONATATE 100 MG PO CAPS: 100.0000 mg | ORAL_CAPSULE | Freq: Three times a day (TID) | ORAL | Status: DC

## 2015-06-24 MED ORDER — SULFAMETHOXAZOLE-TRIMETHOPRIM 800-160 MG PO TABS: 1.0000 | ORAL_TABLET | Freq: Two times a day (BID) | ORAL | Status: DC

## 2015-06-24 MED ORDER — PREDNISONE 5 MG PO TABS: 5.0000 mg | ORAL_TABLET | Freq: Two times a day (BID) | ORAL | Status: DC

## 2015-06-24 MED ORDER — METHYLPREDNISOLONE ACETATE 80 MG/ML IJ SUSP
80.0000 mg | Freq: Once | INTRAMUSCULAR | Status: AC
  Administered 2015-06-24: 80 mg via INTRAMUSCULAR

## 2015-06-24 MED ORDER — FLUCONAZOLE 150 MG PO TABS: 150.0000 mg | ORAL_TABLET | Freq: Once | ORAL | Status: DC

## 2015-06-24 NOTE — Assessment & Plan Note (Signed)
Controlled, no change in medication DASH diet and commitment to daily physical activity for a minimum of 30 minutes discussed and encouraged, as a part of hypertension management. The importance of attaining a healthy weight is also discussed.  BP/Weight 06/24/2015 05/04/2015 12/16/2014 07/31/2014 06/03/2014 12/19/2013 11/46/4314  Systolic BP 276 701 100 349 611 643 539  Diastolic BP 80 78 78 80 74 80 90  Wt. (Lbs) 209 211.01 212.12 214 212.01 214.08 203.7  BMI 43.69 44.11 44.35 44.74 44.32 44.75 42.59

## 2015-06-24 NOTE — Assessment & Plan Note (Signed)
Rapid strep negative.

## 2015-06-24 NOTE — Patient Instructions (Signed)
F/u in December , as before , call if t you need me sooner  You do not have strep throat  You have right ear infection, antibiotic septra is prescribed  You have uncontrolled allergy symptoms, depo medrol in office , followed by prednisone for 5 days. Start once daily  Sudafed for next 3 to 5 days to help with excess drainage. Start twice daily netty pot flushes to help clear nasal passage and sinuses  For cough and chest congestion, decongestant tablets and cough suppressant syrup for bedtime use is prescribed  Work excuse from 10/03 to return 06/29/2015  [facility] you feel better soon Thanks for choosing [facility], we consider it a privelige to serve you.  Please work on good  health habits so that your health will improve. 1. Commitment to daily physical activity for 30 to 60  minutes, if you are able to do this.  2. Commitment to wise food choices. Aim for half of your  food intake to be vegetable and fruit, one quarter starchy foods, and one quarter protein. Try to eat on a regular schedule  3 meals per day, snacking between meals should be limited to vegetables or fruits or small portions of nuts. 64 ounces of water per day is generally recommended, unless you have specific health conditions, like heart failure or kidney failure where you will need to limit fluid intake.  3. Commitment to sufficient and a  good quality of physical and mental rest daily, generally between 6 to 8 hours per day.  WITH PERSISTANCE AND PERSEVERANCE, THE IMPOSSIBLE , BECOMES THE NORM!

## 2015-06-24 NOTE — Progress Notes (Signed)
   Subjective:    Patient ID: [name] [name], female    DOB: 1971/03/13, 44 y.o.   MRN: [id number]  HPI 4 day h/o right  ear pressure with ringing facial pressure , with excessive mucus down back of throat, sore throat , and chest congestion. Has had chills but no documented fever. Unable to sleep due to excessive cough, fatigued, has not been able to work this weeek   Review of Systems See HPI    DDenies chest pains, palpitations and leg swelling Denies abdominal pain, nausea, vomiting,diarrhea or constipation.   Denies dysuria, frequency, hesitancy or incontinence. Denies joint pain, swelling and limitation in mobility. Denies headaches, seizures, numbness, or tingling. Denies depression, anxiety or insomnia. Denies skin break down or rash.      Objective:   Physical Exam BP 122/80 mmHg  Pulse 85  Temp(Src) 98.9 F (37.2 C) (Oral)  Resp 16  Ht 4\' 10"  (1.473 m)  Wt 209 lb (94.802 kg)  BMI 43.69 kg/m2  SpO2 96%  LMP 05/22/2015 Patient alert and oriented and in no cardiopulmonary distress.  HEENT: No facial asymmetry, EOMI,   oropharynx pink and moist.No exudate  Neck supple no JVD, no mass.No cervical adenopathy.erythema and edema of nasal mucosa Right TM dull and erythematous, poor light reflex Chest: decreased though adequate air entry, few basilar crackles, no wheezes  CVS: S1, S2 no murmurs, no S3.Regular rate.  ABD: Soft non tender.   Ext: No edema  MS: Adequate ROM spine, shoulders, hips and knees.  Skin: Intact, no ulcerations or rash noted.  Psych: Good eye contact, normal affect. Memory intact not anxious or depressed appearing.  CNS: CN 2-12 intact, power,  normal throughout.no focal deficits noted.        Assessment & Plan:  Right otitis media with effusion 1 week  Course of antibiotic prescribed, also to take sudafed one daily for the next 3 to 5 days  Sore throat Rapid strep negative  Acute bronchitis Decongestant and cough suppressant  prescribed  ALLERGIC RHINITIS, SEASONAL Uncontrolled, depo medrol in office and short course of prednisone  Essential hypertension Controlled, no change in medication DASH diet and commitment to daily physical activity for a minimum of 30 minutes discussed and encouraged, as a part of hypertension management. The importance of attaining a healthy weight is also discussed.  BP/Weight 06/24/2015 05/04/2015 12/16/2014 07/31/2014 06/03/2014 12/19/2013 72/05/4708  Systolic BP 628 366 294 765 465 035 465  Diastolic BP 80 78 78 80 74 80 90  Wt. (Lbs) 209 211.01 212.12 214 212.01 214.08 203.7  BMI 43.69 44.11 44.35 44.74 44.32 44.75 42.59        Morbid obesity Unchnaged Patient re-educated about  the importance of commitment to a  minimum of 150 minutes of exercise per week.  The importance of healthy food choices with portion control discussed. Encouraged to start a food diary, count calories and to consider  joining a support group. Sample diet sheets offered. Goals set by the patient for the next several months.   Weight /BMI 06/24/2015 05/04/2015 12/16/2014  WEIGHT 209 lb 211 lb 0.1 oz 212 lb 1.9 oz  HEIGHT 4\' 10"  4\' 10"  4\' 10"   BMI 43.69 kg/m2 44.11 kg/m2 44.35 kg/m2    Current exercise per week 90 minutes.   ANXIETY DISORDER, GENERALIZED Controlled, no change in medication

## 2015-06-24 NOTE — Assessment & Plan Note (Signed)
1 week  Course of antibiotic prescribed, also to take sudafed one daily for the next 3 to 5 days

## 2015-06-24 NOTE — Assessment & Plan Note (Signed)
Uncontrolled, depo medrol in office and short course of prednisone

## 2015-06-24 NOTE — Assessment & Plan Note (Signed)
Unchnaged Patient re-educated about  the importance of commitment to a  minimum of 150 minutes of exercise per week.  The importance of healthy food choices with portion control discussed. Encouraged to start a food diary, count calories and to consider  joining a support group. Sample diet sheets offered. Goals set by the patient for the next several months.   Weight /BMI 06/24/2015 05/04/2015 12/16/2014  WEIGHT 209 lb 211 lb 0.1 oz 212 lb 1.9 oz  HEIGHT 4\' 10"  4\' 10"  4\' 10"   BMI 43.69 kg/m2 44.11 kg/m2 44.35 kg/m2    Current exercise per week 90 minutes.

## 2015-06-24 NOTE — Assessment & Plan Note (Signed)
Controlled, no change in medication  

## 2015-06-24 NOTE — Assessment & Plan Note (Signed)
Decongestant and cough suppressant prescribed

## 2015-06-29 ENCOUNTER — Other Ambulatory Visit: Payer: Self-pay | Admitting: Family Medicine

## 2015-07-07 ENCOUNTER — Other Ambulatory Visit: Payer: Self-pay | Admitting: Family Medicine

## 2015-07-16 ENCOUNTER — Telehealth: Payer: Self-pay

## 2015-07-16 DIAGNOSIS — H9211 Otorrhea, right ear: Secondary | ICD-10-CM

## 2015-07-16 NOTE — Telephone Encounter (Signed)
Refer to soonest ENT appt  I wuiill sign

## 2015-07-16 NOTE — Addendum Note (Signed)
Addended by: Eual Fines on: 07/16/2015 04:40 PM   Modules accepted: Orders

## 2015-07-16 NOTE — Telephone Encounter (Signed)
Was treated with septra a few weeks ago for right ear infection. Still having problems with fluid in the right ear and hearing a "wooshing" sound everytime she turns her head to the right. Wants referral to Dr Benjamine Mola or [facility] (whichever is quickest) ok to refer?

## 2015-07-16 NOTE — Telephone Encounter (Signed)
Referral sent 

## 2015-08-20 ENCOUNTER — Ambulatory Visit (INDEPENDENT_AMBULATORY_CARE_PROVIDER_SITE_OTHER): Payer: BC Managed Care – PPO | Admitting: Otolaryngology

## 2015-08-31 ENCOUNTER — Other Ambulatory Visit: Payer: Self-pay | Admitting: Obstetrics and Gynecology

## 2015-09-04 ENCOUNTER — Other Ambulatory Visit: Payer: Self-pay | Admitting: Family Medicine

## 2015-09-09 ENCOUNTER — Ambulatory Visit: Payer: BC Managed Care – PPO | Admitting: Family Medicine

## 2015-09-25 ENCOUNTER — Other Ambulatory Visit: Payer: Self-pay | Admitting: Obstetrics and Gynecology

## 2015-09-25 DIAGNOSIS — D259 Leiomyoma of uterus, unspecified: Secondary | ICD-10-CM

## 2015-09-25 NOTE — Patient Instructions (Addendum)
Your procedure is scheduled on:  Thursday, Jan. 12, 2017  Enter through the Main Entrance of [facility] at:  10:30 A.M.  Pick up the phone at the desk and dial [phone].  Call this number if you have problems the morning of surgery: [phone].  Remember: Do NOT eat food:  After Midnight Wednesday Do NOT drink clear liquids after:  8:00 A.M. Day of surgery Take these medicines the morning of surgery with a SIP OF WATER:  Amlodipine, Omeprazole, Celexa  Do NOT wear jewelry (body piercing), metal hair clips/bobby pins, make-up, or nail polish. Do NOT wear lotions, powders, or perfumes.  You may wear deoderant. Do NOT shave for 48 hours prior to surgery. Do NOT bring valuables to the hospital. Contacts, dentures, or bridgework may not be worn into surgery. Leave suitcase in car.  After surgery it may be brought to your room.  For patients admitted to the hospital, checkout time is 11:00 AM the day of discharge.

## 2015-09-28 ENCOUNTER — Ambulatory Visit
Admission: RE | Admit: 2015-09-28 | Discharge: 2015-09-28 | Disposition: A | Discharge status: Home / Self Care | Payer: BC Managed Care – PPO | Source: Ambulatory Visit | Attending: Obstetrics and Gynecology | Admitting: Obstetrics and Gynecology

## 2015-09-28 ENCOUNTER — Other Ambulatory Visit: Payer: BC Managed Care – PPO

## 2015-09-28 ENCOUNTER — Inpatient Hospital Stay (HOSPITAL_COMMUNITY)
Admission: RE | Admit: 2015-09-28 | Discharge: 2015-09-28 | Disposition: A | Discharge status: Home / Self Care | Payer: BC Managed Care – PPO | Source: Ambulatory Visit

## 2015-09-28 DIAGNOSIS — D259 Leiomyoma of uterus, unspecified: Secondary | ICD-10-CM

## 2015-09-28 MED ORDER — GADOBENATE DIMEGLUMINE 529 MG/ML IV SOLN
18.0000 mL | Freq: Once | INTRAVENOUS | Status: AC | PRN
  Administered 2015-09-28: 18 mL via INTRAVENOUS

## 2015-09-29 ENCOUNTER — Encounter (HOSPITAL_COMMUNITY): Payer: Self-pay

## 2015-09-29 ENCOUNTER — Encounter (HOSPITAL_COMMUNITY)
Admission: RE | Admit: 2015-09-29 | Discharge: 2015-09-29 | Disposition: A | Discharge status: Home / Self Care | Payer: BC Managed Care – PPO | Source: Ambulatory Visit | Attending: Obstetrics and Gynecology | Admitting: Obstetrics and Gynecology

## 2015-09-29 ENCOUNTER — Other Ambulatory Visit: Payer: Self-pay

## 2015-09-29 DIAGNOSIS — Z01812 Encounter for preprocedural laboratory examination: Secondary | ICD-10-CM | POA: Diagnosis present

## 2015-09-29 DIAGNOSIS — Z01810 Encounter for preprocedural cardiovascular examination: Secondary | ICD-10-CM | POA: Diagnosis not present

## 2015-09-29 HISTORY — DX: Bursopathy, unspecified: M71.9

## 2015-09-29 LAB — BASIC METABOLIC PANEL
Anion gap: 11 (ref 5–15)
BUN: 14 mg/dL (ref 6–20)
CALCIUM: 9 mg/dL (ref 8.9–10.3)
CO2: 22 mmol/L (ref 22–32)
Chloride: 101 mmol/L (ref 101–111)
Creatinine, Ser: 0.8 mg/dL (ref 0.44–1.00)
GFR calc Af Amer: >60 mL/min (ref >60)
GLUCOSE: 77 mg/dL (ref 65–99)
Potassium: 3.8 mmol/L (ref 3.5–5.1)
Sodium: 134 mmol/L — ABNORMAL LOW (ref 135–145)

## 2015-09-29 LAB — CBC
HEMATOCRIT: 37.7 % (ref 36.0–46.0)
Hemoglobin: 12.5 g/dL (ref 12.0–15.0)
MCH: 28.9 pg (ref 26.0–34.0)
MCHC: 33.2 g/dL (ref 30.0–36.0)
MCV: 87.3 fL (ref 78.0–100.0)
PLATELETS: 406 10*3/uL — AB (ref 150–400)
RBC: 4.32 MIL/uL (ref 3.87–5.11)
RDW: 13.5 % (ref 11.5–15.5)
WBC: 7.2 10*3/uL (ref 4.0–10.5)

## 2015-09-29 NOTE — Patient Instructions (Addendum)
Your procedure is scheduled on:  Thursday, Jan. 12, 2017  Enter through the Main Entrance of [facility] at:  10:30 A.M.  Pick up the phone at the desk and dial [phone].  Call this number if you have problems the morning of surgery: [phone].  Remember: Do NOT eat food:  After Midnight Wednesday Do NOT drink clear liquids after:  8:00A.M. Day of surgery Take these medicines the morning of surgery with a SIP OF WATER:  Amlodipine, Prilosec, Citalopram  Do NOT wear jewelry (body piercing), metal hair clips/bobby pins, make-up, or nail polish. Do NOT wear lotions, powders, or perfumes.  You may wear deoderant. Do NOT shave for 48 hours prior to surgery. Do NOT bring valuables to the hospital. Contacts, dentures, or bridgework may not be worn into surgery. Leave suitcase in car.  After surgery it may be brought to your room.  For patients admitted to the hospital, checkout time is 11:00 AM the day of discharge.

## 2015-09-30 ENCOUNTER — Other Ambulatory Visit: Payer: BC Managed Care – PPO

## 2015-10-01 ENCOUNTER — Encounter (HOSPITAL_COMMUNITY): Admission: RE | Payer: Self-pay | Source: Ambulatory Visit

## 2015-10-01 ENCOUNTER — Ambulatory Visit (HOSPITAL_COMMUNITY)
Admission: RE | Admit: 2015-10-01 | Payer: BC Managed Care – PPO | Source: Ambulatory Visit | Admitting: Obstetrics and Gynecology

## 2015-10-01 SURGERY — ROBOTIC ASSISTED TOTAL HYSTERECTOMY WITH SALPINGECTOMY
Anesthesia: General

## 2015-10-11 ENCOUNTER — Other Ambulatory Visit: Payer: Self-pay | Admitting: Family Medicine

## 2015-10-15 ENCOUNTER — Telehealth: Payer: Self-pay | Admitting: Family Medicine

## 2015-10-15 DIAGNOSIS — F411 Generalized anxiety disorder: Secondary | ICD-10-CM

## 2015-10-15 MED ORDER — PANTOPRAZOLE SODIUM 40 MG PO TBEC: 40.0000 mg | DELAYED_RELEASE_TABLET | Freq: Every day | ORAL | Status: DC

## 2015-10-15 MED ORDER — VITAMIN D (ERGOCALCIFEROL) 1.25 MG (50000 UNIT) PO CAPS: 50000.0000 [IU] | ORAL_CAPSULE | ORAL | Status: DC

## 2015-10-15 MED ORDER — ALPRAZOLAM 1 MG PO TABS: 1.0000 mg | ORAL_TABLET | Freq: Every evening | ORAL | Status: DC | PRN

## 2015-10-15 MED ORDER — AMLODIPINE BESYLATE 2.5 MG PO TABS: ORAL_TABLET | ORAL | Status: DC

## 2015-10-15 MED ORDER — CITALOPRAM HYDROBROMIDE 20 MG PO TABS: 20.0000 mg | ORAL_TABLET | Freq: Every day | ORAL | Status: DC

## 2015-10-15 NOTE — Telephone Encounter (Signed)
States her son is in the hospital at [facility] and he is very sick and she can't exercise and eat well at this time and she is wanting to try the weight loss drug Contrave so she will have hopefully lost weight before her visit in march. Please advise

## 2015-10-15 NOTE — Telephone Encounter (Signed)
Patient is calling asking for a nurse to call her regarding questions about a medication

## 2015-10-16 NOTE — Telephone Encounter (Signed)
pls let her know that I am concerned about her son's health and hope that he recovers completely from his current illness Encourage her to focus on wise food choices , esp in her current stressful situation, and even if she walks in the hospital for  Two 15 min sessions, this will help a lot both in stress management and will satisfy recommended daily exercise  She needs an OV to review all s/e before I will prescribe the drug

## 2015-10-16 NOTE — Telephone Encounter (Signed)
Patient aware.

## 2015-11-23 ENCOUNTER — Ambulatory Visit (INDEPENDENT_AMBULATORY_CARE_PROVIDER_SITE_OTHER): Payer: BC Managed Care – PPO | Admitting: Family Medicine

## 2015-11-23 ENCOUNTER — Encounter: Payer: Self-pay | Admitting: Family Medicine

## 2015-11-23 VITALS — BP 132/82 | HR 78 | Resp 16 | Ht <= 58 in | Wt 212.0 lb

## 2015-11-23 DIAGNOSIS — R002 Palpitations: Secondary | ICD-10-CM

## 2015-11-23 DIAGNOSIS — G4700 Insomnia, unspecified: Secondary | ICD-10-CM

## 2015-11-23 DIAGNOSIS — M5442 Lumbago with sciatica, left side: Secondary | ICD-10-CM | POA: Diagnosis not present

## 2015-11-23 DIAGNOSIS — I10 Essential (primary) hypertension: Secondary | ICD-10-CM

## 2015-11-23 DIAGNOSIS — F411 Generalized anxiety disorder: Secondary | ICD-10-CM

## 2015-11-23 DIAGNOSIS — E559 Vitamin D deficiency, unspecified: Secondary | ICD-10-CM

## 2015-11-23 DIAGNOSIS — M549 Dorsalgia, unspecified: Secondary | ICD-10-CM | POA: Insufficient documentation

## 2015-11-23 DIAGNOSIS — R7302 Impaired glucose tolerance (oral): Secondary | ICD-10-CM

## 2015-11-23 DIAGNOSIS — Z01818 Encounter for other preprocedural examination: Secondary | ICD-10-CM

## 2015-11-23 DIAGNOSIS — Z01810 Encounter for preprocedural cardiovascular examination: Secondary | ICD-10-CM

## 2015-11-23 DIAGNOSIS — K219 Gastro-esophageal reflux disease without esophagitis: Secondary | ICD-10-CM

## 2015-11-23 DIAGNOSIS — J301 Allergic rhinitis due to pollen: Secondary | ICD-10-CM

## 2015-11-23 MED ORDER — GABAPENTIN 100 MG PO CAPS: 100.0000 mg | ORAL_CAPSULE | Freq: Every day | ORAL | Status: DC

## 2015-11-23 MED ORDER — NALTREXONE-BUPROPION HCL ER 8-90 MG PO TB12: ORAL_TABLET | ORAL | Status: DC

## 2015-11-23 MED ORDER — TEMAZEPAM 15 MG PO CAPS: 15.0000 mg | ORAL_CAPSULE | Freq: Every evening | ORAL | Status: DC | PRN

## 2015-11-23 NOTE — Patient Instructions (Addendum)
F/u in 4 month, call if you need me sooner  New medication sent for sleep and practice good hygiene  Fasting labs as soon as possible  Gabapentin at bedtime and tylenol 500 mg at bedtime for back pain  X ray of low back today please  You are refertrred for cardiology clearance before your surgery   All the best with surgery  Work on changing food environment so that you have easy access to healthy foods, which are fruit and vegetables (not potato and corn)  Weight loss pill has drug interaction with celexa so cannot use that

## 2015-11-23 NOTE — Progress Notes (Signed)
Subjective:    Patient ID: [name] [name], female    DOB: Apr 22, 1971, 45 y.o.   MRN: [id number]  HPI   [name] [name]     MRN: [id number]      DOB: Nov 19, 1970   HPI [name] [name] is here for follow up and re-evaluation of chronic medical conditions, medication management and review of any available recent lab and radiology data.  Preventive health is updated, specifically  Cancer screening and Immunization.   Questions or concerns regarding consultations or procedures which the PT has had in the interim are  Addressed. Has upcoming gyne surgery planned for excessive menses, this will be done at [facility] as she is known to have a lot of scar tissue from prior surgery . Due to her h/o palpitations , and fact that she has increased CV risk due to her metabolic profile , I am referring her for cardiac evaluation prior to her planned gyne surgery The PT denies any adverse reactions to current medications since the last visit. Requesting alternate appetite suppressant, unfortunately the one she wishes has a drug interaction which precludes its use. I told her that I do recommend bariatric surgery for her as she has been unable to lose weight ,and instead continues to gain weight with multiple associated co morbidities ROS Denies recent fever or chills. Denies sinus pressure, nasal congestion, ear pain or sore throat. Denies chest congestion, productive cough or wheezing. Denies chest pains, palpitations and leg swelling Denies abdominal pain, nausea, vomiting,diarrhea or constipation.   Denies dysuria, frequency, hesitancy or incontinence.  Denies headaches, seizures, numbness, or tingling. Denies depression, c/o increased  anxiety and  insomnia. Denies skin break down or rash.   PE  BP 132/82 mmHg  Pulse 78  Resp 16  Ht 4\' 10"  (1.473 m)  Wt 212 lb (96.163 kg)  BMI 44.32 kg/m2  SpO2 99%  Patient alert and oriented and in no cardiopulmonary distress.  HEENT: No facial asymmetry, EOMI,    oropharynx pink and moist.  Neck supple no JVD, no mass.  Chest: Clear to auscultation bilaterally.  CVS: S1, S2 no murmurs, no S3.Regular rate.  ABD: Soft non tender.   Ext: No edema  MS: decreased  ROM lumbar  spine,  adequate in shoulders, hips and knees.  Skin: Intact, no ulcerations or rash noted.  Psych: Good eye contact, normal affect. Memory intact mildly  anxious not  depressed appearing.  CNS: CN 2-12 intact, power,  normal throughout.no focal deficits noted.   Assessment & Plan   Essential hypertension Controlled, no change in medication DASH diet and commitment to daily physical activity for a minimum of 30 minutes discussed and encouraged, as a part of hypertension management. The importance of attaining a healthy weight is also discussed.  BP/Weight 11/23/2015 09/29/2015 06/24/2015 05/04/2015 12/16/2014 07/31/2014 [id number]  Systolic BP Q000111Q 123456 [id number] 123456 [id number]  Diastolic BP 82 87 80 78 78 80 74  Wt. (Lbs) 212 208.38 209 211.01 212.12 214 212.01  BMI 44.32 43.56 43.69 44.11 44.35 44.74 44.32        ALLERGIC RHINITIS, SEASONAL Good response to flonase , continue same. Increased symptoms anticipated with season change  GERD Controlled, no change in medication   Insomnia Sleep hygiene reviewed and written information offered also. Prescription sent for  medication needed.   Backache Uncontrolled , ad gabapentin at bedtime Weight loss encouraged also  Morbid obesity Deteriorated. Patient re-educated about  the importance of commitment to a  minimum of  150 minutes of exercise per week.  The importance of healthy food choices with portion control discussed. Encouraged to start a food diary, count calories and to consider  joining a support group. Sample diet sheets offered. Goals set by the patient for the next several months.   Weight /BMI 11/23/2015 09/29/2015 06/24/2015  WEIGHT 212 lb 208 lb 6 oz 209 lb  HEIGHT 4\' 10"  4\' 10"  4\' 10"   BMI 44.32  kg/m2 43.56 kg/m2 43.69 kg/m2    Current exercise per week 40 minutes. Info provided re bariatric surgery, pt encouraged to pursue this option   ANXIETY DISORDER, GENERALIZED Controlled, no change in medication Recently under significant stress as her son was hospitalized for surgery, he is now home and recovering well  PALPITATIONS Managed by cardiology and controlled Planned gyne surgery in the near future , anticipated t by local gyne to be prolonged and complicated as pt has h/o extensive scar tissue, she will actually have the surgery at [facility] to re evaluate CV system in particular for pre op clearance       Review of Systems     Objective:   Physical Exam        Assessment & Plan:

## 2015-12-05 NOTE — Assessment & Plan Note (Signed)
Controlled, no change in medication DASH diet and commitment to daily physical activity for a minimum of 30 minutes discussed and encouraged, as a part of hypertension management. The importance of attaining a healthy weight is also discussed.  BP/Weight 11/23/2015 09/29/2015 06/24/2015 05/04/2015 12/16/2014 07/31/2014 [id number]  Systolic BP Q000111Q 123456 [id number] 123456 [id number]  Diastolic BP 82 87 80 78 78 80 74  Wt. (Lbs) 212 208.38 209 211.01 212.12 214 212.01  BMI 44.32 43.56 43.69 44.11 44.35 44.74 44.Violet

## 2015-12-05 NOTE — Assessment & Plan Note (Signed)
Uncontrolled , ad gabapentin at bedtime Weight loss encouraged also

## 2015-12-05 NOTE — Assessment & Plan Note (Signed)
Managed by cardiology and controlled Planned gyne surgery in the near future , anticipated t by local gyne to be prolonged and complicated as pt has h/o extensive scar tissue, she will actually have the surgery at [facility] to re evaluate CV system in particular for pre op clearance

## 2015-12-05 NOTE — Assessment & Plan Note (Signed)
Deteriorated. Patient re-educated about  the importance of commitment to a  minimum of 150 minutes of exercise per week.  The importance of healthy food choices with portion control discussed. Encouraged to start a food diary, count calories and to consider  joining a support group. Sample diet sheets offered. Goals set by the patient for the next several months.   Weight /BMI 11/23/2015 09/29/2015 06/24/2015  WEIGHT 212 lb 208 lb 6 oz 209 lb  HEIGHT 4\' 10"  4\' 10"  4\' 10"   BMI 44.32 kg/m2 43.56 kg/m2 43.69 kg/m2    Current exercise per week 40 minutes. Info provided re bariatric surgery, pt encouraged to pursue this option

## 2015-12-05 NOTE — Assessment & Plan Note (Signed)
Good response to flonase , continue same. Increased symptoms anticipated with season change

## 2015-12-05 NOTE — Assessment & Plan Note (Signed)
Controlled, no change in medication  

## 2015-12-05 NOTE — Assessment & Plan Note (Signed)
Controlled, no change in medication Recently under significant stress as her son was hospitalized for surgery, he is now home and recovering well

## 2015-12-05 NOTE — Assessment & Plan Note (Signed)
Sleep hygiene reviewed and written information offered also. Prescription sent for  medication needed.  

## 2015-12-11 ENCOUNTER — Ambulatory Visit (INDEPENDENT_AMBULATORY_CARE_PROVIDER_SITE_OTHER): Payer: BC Managed Care – PPO | Admitting: Cardiovascular Disease

## 2015-12-11 ENCOUNTER — Encounter: Payer: Self-pay | Admitting: Cardiovascular Disease

## 2015-12-11 ENCOUNTER — Ambulatory Visit: Payer: BC Managed Care – PPO | Admitting: Cardiovascular Disease

## 2015-12-11 VITALS — BP 124/78 | HR 70 | Ht <= 58 in | Wt 212.0 lb

## 2015-12-11 DIAGNOSIS — I471 Supraventricular tachycardia: Secondary | ICD-10-CM | POA: Diagnosis not present

## 2015-12-11 DIAGNOSIS — I10 Essential (primary) hypertension: Secondary | ICD-10-CM | POA: Diagnosis not present

## 2015-12-11 DIAGNOSIS — Z01810 Encounter for preprocedural cardiovascular examination: Secondary | ICD-10-CM | POA: Diagnosis not present

## 2015-12-11 NOTE — Progress Notes (Signed)
Patient ID: [name] [name], female   DOB: 1971-01-03, 45 y.o.   MRN: [id number]    Cardiology Office Note    Date:  12/12/2015   ID:  [name] [name], DOB 1971-08-13, MRN [id number]  PCP:  Tula Nakayama, MD  Cardiologist:   Sanda Klein, MD   Chief Complaint  Patient presents with  . Follow-up     no chest pain, no shortness of breath, no edema, no pain or cramping in legs, no lightheadedness or dizziness, no fatigue  . Medical Clearance    having upcoming surgery     History of Present Illness:  [name] [name] is a 45 y.o. female with a history of palpitations, morbid obesity, mild systemic hypertension who returns for evaluation before a planned hysterectomy. She continues to have occasional palpitations but these are brief, infrequent and are not associated with dizziness, syncope, dyspnea or angina. Previous workup approximately 2 years ago did not show any evidence of structural heart disease. She had normal left ventricular systolic and diastolic function, normal left atrial size and no abnormalities of the right heart or valves that could be hemodynamically significant. Her event monitor showed occasional brief episodes of paroxysmal atrial tachycardia but did not show ventricular arrhythmia or atrial fibrillation. She was intolerant of beta blockers and calcium channel blockers due to fatigue. She has noticed that if she avoids caffeine the palpitations are less frequent.   Past Medical History  Diagnosis Date  . Anxiety   . GERD (gastroesophageal reflux disease)   . PONV (postoperative nausea and vomiting)   . Shortness of breath   . Hypertension     seen by [facility]  . Palpitations   . Venous insufficiency   . Bursitis     Past Surgical History  Procedure Laterality Date  . Cesarean section    . Dilation and curettage of uterus    . Laparoscopic tubal ligation  04/02/2012    Procedure: LAPAROSCOPIC TUBAL LIGATION;  Surgeon: Marvene Staff, MD;   Location: [facility];  Service: Gynecology;  Laterality: Bilateral;  . Hysteroscopy w/d&c  04/02/2012    Procedure: DILATATION AND CURETTAGE /HYSTEROSCOPY;  Surgeon: Marvene Staff, MD;  Location: [facility];  Service: Gynecology;  Laterality: N/A;  . Transthoracic echocardiogram  01/11/2009    EF >55%, normal    Outpatient Prescriptions Prior to Visit  Medication Sig Dispense Refill  . ALPRAZolam (XANAX) 1 MG tablet Take 1 tablet (1 mg total) by mouth at bedtime as needed. for sleep 30 tablet 2  . amLODipine (NORVASC) 2.5 MG tablet TAKE 1 TABLET BY MOUTH EVERY DAY 30 tablet 3  . citalopram (CELEXA) 20 MG tablet Take 1 tablet (20 mg total) by mouth daily. 30 tablet 3  . clotrimazole-betamethasone (LOTRISONE) cream Apply topically 2 (two) times daily as needed. (Patient taking differently: Apply 1 application topically 2 (two) times daily as needed (rash or irritation). ) 30 g 3  . cyclobenzaprine (FLEXERIL) 10 MG tablet Take 10 mg by mouth 3 (three) times daily as needed for muscle spasms.    . fluticasone (FLONASE) 50 MCG/ACT nasal spray SHAKE WELL AND USE 2 SPRAYS IN EACH NOSTRIL DAILY AS NEEDED (Patient taking differently: SHAKE WELL AND USE 2 SPRAYS IN EACH NOSTRIL DAILY AS NEEDED for allergies) 16 g 5  . gabapentin (NEURONTIN) 100 MG capsule Take 1 capsule (100 mg total) by mouth at bedtime. 30 capsule 3  . loratadine (CLARITIN) 10 MG tablet TAKE 1 TABLET(10 MG) BY MOUTH DAILY 30  tablet 6  . nystatin (MYCOSTATIN/NYSTOP) 100000 UNIT/GM POWD APPLY TOPICALLY TWICE DAILY 15 g 0  . omeprazole (PRILOSEC OTC) 20 MG tablet Take 20 mg by mouth daily.    . RESTASIS 0.05 % ophthalmic emulsion Place 1 drop into both eyes as needed (dry eyes).     . temazepam (RESTORIL) 15 MG capsule Take 1 capsule (15 mg total) by mouth at bedtime as needed for sleep. 30 capsule 4  . traMADol (ULTRAM) 50 MG tablet Take 50 mg by mouth every 6 (six) hours as needed for moderate pain. Reported on 11/23/2015    . Vitamin D,  Ergocalciferol, (DRISDOL) 50000 units CAPS capsule Take 1 capsule (50,000 Units total) by mouth every 7 (seven) days. 4 capsule 3   No facility-administered medications prior to visit.     Allergies:   Diltiazem and Phentermine   Social History   Social History  . Marital Status: Married    Spouse Name: N/A  . Number of Children: N/A  . Years of Education: N/A   Social History Main Topics  . Smoking status: Never Smoker   . Smokeless tobacco: Never Used  . Alcohol Use: Yes     Comment: occasionally  . Drug Use: No  . Sexual Activity: Yes   Other Topics Concern  . None   Social History Narrative     Family History:  The patient's family history includes Cancer (age of onset: 101) in her paternal aunt; Cancer (age of onset: 86) in her father; Diabetes in her father, sister, and sister; Hypertension in her father, mother, sister, and sister.   ROS:   Please see the history of present illness.    ROS All other systems reviewed and are negative.   PHYSICAL EXAM:   VS:  BP 124/78 mmHg  Pulse 70  Ht 4\' 10"  (1.473 m)  Wt 96.163 kg (212 lb)  BMI 44.32 kg/m2  LMP 11/17/2015   GEN: Well nourished, well developed, in no acute distress HEENT: normal Neck: no JVD, carotid bruits, or masses Cardiac: RRR; no murmurs, rubs, or gallops,no edema  Respiratory:  clear to auscultation bilaterally, normal work of breathing GI: soft, nontender, nondistended, + BS MS: no deformity or atrophy Skin: warm and dry, no rash Neuro:  Alert and Oriented x 3, Strength and sensation are intact Psych: euthymic mood, full affect  Wt Readings from Last 3 Encounters:  12/11/15 96.163 kg (212 lb)  11/23/15 96.163 kg (212 lb)  09/29/15 94.518 kg (208 lb 6 oz)      Studies/Labs Reviewed:   EKG:  EKG is ordered today.  The ekg ordered today demonstrates NSR, normal tracing  Recent Labs: 09/29/2015: BUN 14; Creatinine, Ser 0.80; Hemoglobin 12.5; Platelets 406*; Potassium 3.8; Sodium 134*    Lipid Panel    Component Value Date/Time   CHOL 162 08/28/2014 1450   TRIG 64 08/28/2014 1450   HDL 64 08/28/2014 1450   CHOLHDL 2.5 08/28/2014 1450   VLDL 13 08/28/2014 1450   LDLCALC 85 08/28/2014 1450      ASSESSMENT:    1. Preoperative cardiovascular examination   2. Essential hypertension   3. Paroxysmal atrial tachycardia ([place])   4. Morbid obesity due to excess calories ([place])      PLAN:  In order of problems listed above:  1. I think she is at low risk for major cardiovascular complications with planned hysterectomy. She is at mildly increased risk of respiratory complications primarily due to obesity. While it is possible she  will have more episodes of nonsustained atrial tachycardia due to the hyperadrenergic state around her surgery, it is very unlikely that these will be clinically significant. 2. Well-controlled on a very low dose of medication. It is likely that her hypertension would resolve with weight loss 3. Pharmacological therapy for her arrhythmia is only necessary for symptom control. She did not tolerate either beta blockers or centrally acting calcium channel blockers due to fatigue. Avoid triggers such as excessive caffeine consumption 4. She is considering bariatric surgery. From a cardiac standpoint I think she is a good candidate for this procedure and it is likely to have beneficial long-term effects.    Medication Adjustments/Labs and Tests Ordered: Current medicines are reviewed at length with the patient today.  Concerns regarding medicines are outlined above.  Medication changes, Labs and Tests ordered today are listed in the Patient Instructions below. Patient Instructions  Dr Sallyanne Kuster recommends that you follow-up with him as needed.      Mikael Spray, MD  12/12/2015 11:35 AM    [address], [place], [place]  [address] Phone: [phone]; Fax: [phone]

## 2015-12-11 NOTE — Patient Instructions (Signed)
Dr Croitoru recommends that you follow-up with him as needed. 

## 2015-12-12 DIAGNOSIS — Z01810 Encounter for preprocedural cardiovascular examination: Secondary | ICD-10-CM | POA: Insufficient documentation

## 2015-12-12 DIAGNOSIS — R002 Palpitations: Secondary | ICD-10-CM | POA: Insufficient documentation

## 2015-12-12 DIAGNOSIS — I471 Supraventricular tachycardia: Secondary | ICD-10-CM | POA: Insufficient documentation

## 2015-12-14 ENCOUNTER — Other Ambulatory Visit: Payer: Self-pay | Admitting: Family Medicine

## 2016-02-15 ENCOUNTER — Other Ambulatory Visit: Payer: Self-pay | Admitting: Family Medicine

## 2016-02-27 ENCOUNTER — Other Ambulatory Visit: Payer: Self-pay | Admitting: Family Medicine

## 2016-03-14 ENCOUNTER — Encounter: Payer: Self-pay | Admitting: Family Medicine

## 2016-03-14 ENCOUNTER — Ambulatory Visit (HOSPITAL_COMMUNITY)
Admission: RE | Admit: 2016-03-14 | Discharge: 2016-03-14 | Disposition: A | Discharge status: Home / Self Care | Payer: BC Managed Care – PPO | Source: Ambulatory Visit | Attending: Family Medicine | Admitting: Family Medicine

## 2016-03-14 ENCOUNTER — Ambulatory Visit (INDEPENDENT_AMBULATORY_CARE_PROVIDER_SITE_OTHER): Payer: BC Managed Care – PPO | Admitting: Family Medicine

## 2016-03-14 VITALS — BP 118/74 | HR 86 | Temp 98.9°F | Resp 16 | Ht <= 58 in | Wt 214.0 lb

## 2016-03-14 DIAGNOSIS — M5442 Lumbago with sciatica, left side: Secondary | ICD-10-CM

## 2016-03-14 DIAGNOSIS — R51 Headache: Secondary | ICD-10-CM

## 2016-03-14 DIAGNOSIS — G4700 Insomnia, unspecified: Secondary | ICD-10-CM

## 2016-03-14 DIAGNOSIS — F411 Generalized anxiety disorder: Secondary | ICD-10-CM

## 2016-03-14 DIAGNOSIS — G44029 Chronic cluster headache, not intractable: Secondary | ICD-10-CM | POA: Diagnosis not present

## 2016-03-14 DIAGNOSIS — I10 Essential (primary) hypertension: Secondary | ICD-10-CM

## 2016-03-14 DIAGNOSIS — J309 Allergic rhinitis, unspecified: Secondary | ICD-10-CM | POA: Diagnosis not present

## 2016-03-14 DIAGNOSIS — R519 Headache, unspecified: Secondary | ICD-10-CM | POA: Insufficient documentation

## 2016-03-14 MED ORDER — CITALOPRAM HYDROBROMIDE 20 MG PO TABS: 20.0000 mg | ORAL_TABLET | Freq: Every day | ORAL | Status: DC

## 2016-03-14 MED ORDER — AMLODIPINE BESYLATE 2.5 MG PO TABS: ORAL_TABLET | ORAL | Status: DC

## 2016-03-14 MED ORDER — GABAPENTIN 100 MG PO CAPS: 100.0000 mg | ORAL_CAPSULE | Freq: Every day | ORAL | Status: DC

## 2016-03-14 MED ORDER — VITAMIN D (ERGOCALCIFEROL) 1.25 MG (50000 UNIT) PO CAPS: 50000.0000 [IU] | ORAL_CAPSULE | ORAL | Status: DC

## 2016-03-14 MED ORDER — TRAMADOL HCL 50 MG PO TABS: 50.0000 mg | ORAL_TABLET | Freq: Every day | ORAL | Status: DC | PRN

## 2016-03-14 MED ORDER — METHYLPREDNISOLONE ACETATE 80 MG/ML IJ SUSP
80.0000 mg | Freq: Once | INTRAMUSCULAR | Status: AC
  Administered 2016-03-14: 80 mg via INTRAMUSCULAR

## 2016-03-14 MED ORDER — ALPRAZOLAM 1 MG PO TABS: 1.0000 mg | ORAL_TABLET | Freq: Every evening | ORAL | Status: DC | PRN

## 2016-03-14 MED ORDER — LORATADINE 10 MG PO TABS: ORAL_TABLET | ORAL | Status: DC

## 2016-03-14 MED ORDER — PREDNISONE 5 MG (21) PO TBPK: 5.0000 mg | ORAL_TABLET | ORAL | Status: DC

## 2016-03-14 NOTE — Assessment & Plan Note (Addendum)
3 month h/o daily frontal headache, pressure , eyes flicker, rated at a 9, duration up tyo several hours if she does not take tylenol 2 tabs or tramadol, referred to neurology for evaluation and management, history and exam suggestive of cluster migraine headache

## 2016-03-14 NOTE — Patient Instructions (Addendum)
F/u in 4 months, cancel July appointment please  You are treated today for uncontrolled allergy symptoms with depo medrol injection in the office and 6 day course of prednisone is also prescribed Start daily nasal saline flushes also You need to get sinus X ray today  I have sent tramadol for use for headache only, limited supply.  You are being referred to neurologist to evaluate new daily headache, some of which I belive is due to stress and tension  We will work on medication  For sleep and get back to you  Thankful that surgery went well  Call this week if you develop fever or symptoms of infection

## 2016-03-15 ENCOUNTER — Telehealth: Payer: Self-pay | Admitting: Family Medicine

## 2016-03-15 MED ORDER — AZITHROMYCIN 250 MG PO TABS: ORAL_TABLET | ORAL | Status: DC

## 2016-03-15 MED ORDER — BENZONATATE 100 MG PO CAPS: 100.0000 mg | ORAL_CAPSULE | Freq: Two times a day (BID) | ORAL | Status: DC | PRN

## 2016-03-15 NOTE — Telephone Encounter (Signed)
Patient is stating that she is starting to have chills and spitting up with yellow mucos, she is asking for an antibiotic and a work note for yesterday and today, please advise?

## 2016-03-15 NOTE — Telephone Encounter (Signed)
States she was just here for OV and an antibiotic was discussed but wasn't sent and now she is coughing up thick green mucus and having chills and was out of work yesterday and today. Wants me sent and a work note for those days. Please advise

## 2016-03-15 NOTE — Telephone Encounter (Signed)
Pl send z pack x 1 , also  tessalon perles 100 mg twice daily # 20. Advise fluid and rest,  Work note x 2 days OK

## 2016-03-15 NOTE — Telephone Encounter (Signed)
Patent aware and med sent

## 2016-03-15 NOTE — Addendum Note (Signed)
Addended by: Eual Fines on: 03/15/2016 04:32 PM   Modules accepted: Orders

## 2016-03-20 NOTE — Assessment & Plan Note (Signed)
Sleep hygiene reviewed and written information offered also. Prescription sent for  medication needed.  

## 2016-03-20 NOTE — Assessment & Plan Note (Signed)
Deteriorated. Patient re-educated about  the importance of commitment to a  minimum of 150 minutes of exercise per week.  The importance of healthy food choices with portion control discussed. Encouraged to start a food diary, count calories and to consider  joining a support group. Sample diet sheets offered. Goals set by the patient for the next several months.   Weight /BMI 03/14/2016 12/11/2015 11/23/2015  WEIGHT 214 lb 212 lb 212 lb  HEIGHT 4\' 10"  4\' 10"  4\' 10"   BMI 44.74 kg/m2 44.32 kg/m2 44.32 kg/m2

## 2016-03-20 NOTE — Assessment & Plan Note (Signed)
Uncontrolled pt to start daily saline flushesontrolled, depo medrol administered, prednisone prescribed. Pt to call back for worsening symptoms suggestive of infection

## 2016-03-20 NOTE — Assessment & Plan Note (Signed)
Controlled, no change in medication DASH diet and commitment to daily physical activity for a minimum of 30 minutes discussed and encouraged, as a part of hypertension management. The importance of attaining a healthy weight is also discussed.  BP/Weight 03/14/2016 12/11/2015 11/23/2015 09/29/2015 06/24/2015 05/04/2015 0000000  Systolic BP 123456 [id number] Q000111Q 123456 [id number] 123456  Diastolic BP 74 78 82 87 80 78 78  Wt. (Lbs) 214 212 212 208.38 209 211.01 212.12  BMI 44.74 44.32 44.32 43.56 43.69 44.11 44.35

## 2016-03-20 NOTE — Progress Notes (Signed)
Subjective:    Patient ID: [name] [name], female    DOB: December 31, 1970, 45 y.o.   MRN: [id number]  HPI 3 day h/o progressively worsening facial pressure, r, with ear pressure and headache. Nasal drainage is clear, denie fever or chills, no excessive cough C/o difficulty sleeping is being dispensed a 15 day supply of medication instead of 30 days requested, She does practice good sleep hygiene C/o new daily headaches, states her eye specialist has detected vision problems but dis not feel that this is the cause of her headaches and she needs further medical evaluation .    Review of Systems See HPI Denies recent fever or chills. . Denies chest pains, palpitations and leg swelling Denies abdominal pain, nausea, vomiting,diarrhea or constipation.   Denies dysuria, frequency, hesitancy or incontinence. Denies joint pain, swelling and limitation in mobility. Denies seizures, numbness, or tingling. Denies depression, anxiety or insomnia. Denies skin break down or rash.        Objective:   Physical Exam BP 118/74 mmHg  Pulse 86  Temp(Src) 98.9 F (37.2 C) (Oral)  Resp 16  Ht 4\' 10"  (1.473 m)  Wt 214 lb (97.07 kg)  BMI 44.74 kg/m2  SpO2 97% Patient alert and oriented and in no cardiopulmonary distress.  HEENT: No facial asymmetry, EOMI,   oropharynx pink and moist.  Neck supple no JVD, no mass. Erythema and edema of nasal mucosa, no sinus tenderness, excess watery eyes, TM clear Chest: Clear to auscultation bilaterally.  CVS: S1, S2 no murmurs, no S3.Regular rate.  ABD: Soft non tender.   Ext: No edema  MS: Adequate ROM spine, shoulders, hips and knees.  Skin: Intact, no ulcerations or rash noted.  Psych: Good eye contact, normal affect. Memory intact not anxious or depressed appearing.  CNS: CN 2-12 intact, power,  normal throughout.no focal deficits noted.        Assessment & Plan:  Headache 3 month h/o daily frontal headache, pressure , eyes flicker, rated at  a 9, duration up tyo several hours if she does not take tylenol 2 tabs or tramadol, referred to neurology for evaluation and management, history and exam suggestive of cluster migraine headache  Allergic sinusitis Uncontrolled pt to start daily saline flushesontrolled, depo medrol administered, prednisone prescribed. Pt to call back for worsening symptoms suggestive of infection  Insomnia Sleep hygiene reviewed and written information offered also. Prescription sent for  medication needed.   Morbid obesity Deteriorated. Patient re-educated about  the importance of commitment to a  minimum of 150 minutes of exercise per week.  The importance of healthy food choices with portion control discussed. Encouraged to start a food diary, count calories and to consider  joining a support group. Sample diet sheets offered. Goals set by the patient for the next several months.   Weight /BMI 03/14/2016 12/11/2015 11/23/2015  WEIGHT 214 lb 212 lb 212 lb  HEIGHT 4\' 10"  4\' 10"  4\' 10"   BMI 44.74 kg/m2 44.32 kg/m2 44.32 kg/m2       Essential hypertension Controlled, no change in medication DASH diet and commitment to daily physical activity for a minimum of 30 minutes discussed and encouraged, as a part of hypertension management. The importance of attaining a healthy weight is also discussed.  BP/Weight 03/14/2016 12/11/2015 11/23/2015 09/29/2015 06/24/2015 05/04/2015 0000000  Systolic BP 123456 [id number] 123456 [id number]  Diastolic BP 74 78 82 87 80 78 78  Wt. (Lbs) 214 212 212 208.38 209 211.01 212.12  BMI 44.74  44.32 44.32 43.56 43.69 44.11 44.35          

## 2016-03-30 ENCOUNTER — Ambulatory Visit: Payer: BC Managed Care – PPO | Admitting: Family Medicine

## 2016-04-04 ENCOUNTER — Other Ambulatory Visit: Payer: Self-pay | Admitting: Family Medicine

## 2016-04-11 ENCOUNTER — Ambulatory Visit: Payer: BC Managed Care – PPO | Admitting: Neurology

## 2016-04-13 ENCOUNTER — Emergency Department (HOSPITAL_COMMUNITY)
Admission: EM | Admit: 2016-04-13 | Discharge: 2016-04-13 | Disposition: A | Discharge status: Home / Self Care | Payer: BC Managed Care – PPO | Attending: Emergency Medicine | Admitting: Emergency Medicine

## 2016-04-13 ENCOUNTER — Telehealth: Payer: Self-pay

## 2016-04-13 ENCOUNTER — Encounter (HOSPITAL_COMMUNITY): Payer: Self-pay | Admitting: Emergency Medicine

## 2016-04-13 DIAGNOSIS — Z79899 Other long term (current) drug therapy: Secondary | ICD-10-CM | POA: Diagnosis not present

## 2016-04-13 DIAGNOSIS — I10 Essential (primary) hypertension: Secondary | ICD-10-CM | POA: Insufficient documentation

## 2016-04-13 DIAGNOSIS — K5289 Other specified noninfective gastroenteritis and colitis: Secondary | ICD-10-CM | POA: Insufficient documentation

## 2016-04-13 DIAGNOSIS — A059 Bacterial foodborne intoxication, unspecified: Secondary | ICD-10-CM

## 2016-04-13 DIAGNOSIS — R1010 Upper abdominal pain, unspecified: Secondary | ICD-10-CM | POA: Diagnosis present

## 2016-04-13 LAB — CBC WITH DIFFERENTIAL/PLATELET
BASOS ABS: 0 10*3/uL (ref 0.0–0.1)
Basophils Relative: 0 %
Eosinophils Absolute: 0 10*3/uL (ref 0.0–0.7)
Eosinophils Relative: 0 %
HEMATOCRIT: 38.3 % (ref 36.0–46.0)
HEMOGLOBIN: 12.7 g/dL (ref 12.0–15.0)
LYMPHS ABS: 1 10*3/uL (ref 0.7–4.0)
LYMPHS PCT: 13 %
MCH: 29.1 pg (ref 26.0–34.0)
MCHC: 33.2 g/dL (ref 30.0–36.0)
MCV: 87.8 fL (ref 78.0–100.0)
Monocytes Absolute: 0.4 10*3/uL (ref 0.1–1.0)
Monocytes Relative: 6 %
NEUTROS ABS: 6.2 10*3/uL (ref 1.7–7.7)
Neutrophils Relative %: 81 %
PLATELETS: 324 10*3/uL (ref 150–400)
RBC: 4.36 MIL/uL (ref 3.87–5.11)
RDW: 13.8 % (ref 11.5–15.5)
WBC: 7.6 10*3/uL (ref 4.0–10.5)

## 2016-04-13 LAB — COMPREHENSIVE METABOLIC PANEL
ALT: 14 U/L (ref 14–54)
AST: 22 U/L (ref 15–41)
Albumin: 3.4 g/dL — ABNORMAL LOW (ref 3.5–5.0)
Alkaline Phosphatase: 87 U/L (ref 38–126)
Anion gap: 9 (ref 5–15)
BUN: 6 mg/dL (ref 6–20)
CHLORIDE: 103 mmol/L (ref 101–111)
CO2: 20 mmol/L — AB (ref 22–32)
Calcium: 8.2 mg/dL — ABNORMAL LOW (ref 8.9–10.3)
Creatinine, Ser: 0.81 mg/dL (ref 0.44–1.00)
Glucose, Bld: 101 mg/dL — ABNORMAL HIGH (ref 65–99)
POTASSIUM: 3.2 mmol/L — AB (ref 3.5–5.1)
SODIUM: 132 mmol/L — AB (ref 135–145)
Total Bilirubin: 0.3 mg/dL (ref 0.3–1.2)
Total Protein: 7.5 g/dL (ref 6.5–8.1)

## 2016-04-13 LAB — URINALYSIS, ROUTINE W REFLEX MICROSCOPIC
Bilirubin Urine: NEGATIVE
GLUCOSE, UA: NEGATIVE mg/dL
Ketones, ur: 15 mg/dL — AB
LEUKOCYTES UA: NEGATIVE
Nitrite: NEGATIVE
PH: 5.5 (ref 5.0–8.0)
PROTEIN: NEGATIVE mg/dL
SPECIFIC GRAVITY, URINE: 1.025 (ref 1.005–1.030)

## 2016-04-13 LAB — URINE MICROSCOPIC-ADD ON

## 2016-04-13 LAB — LIPASE, BLOOD: LIPASE: 24 U/L (ref 11–51)

## 2016-04-13 LAB — I-STAT CG4 LACTIC ACID, ED: LACTIC ACID, VENOUS: 1.79 mmol/L (ref 0.5–1.9)

## 2016-04-13 MED ORDER — CIPROFLOXACIN HCL 250 MG PO TABS
ORAL_TABLET | ORAL | Status: AC
  Administered 2016-04-13: 500 mg via ORAL
  Filled 2016-04-13: qty 2

## 2016-04-13 MED ORDER — MORPHINE SULFATE (PF) 4 MG/ML IV SOLN
4.0000 mg | Freq: Once | INTRAVENOUS | Status: AC
  Administered 2016-04-13: 4 mg via INTRAVENOUS
  Filled 2016-04-13: qty 1

## 2016-04-13 MED ORDER — CIPROFLOXACIN HCL 500 MG PO TABS
500.0000 mg | ORAL_TABLET | Freq: Two times a day (BID) | ORAL | 0 refills | Status: DC
Start: 2016-04-13 — End: 2016-07-31

## 2016-04-13 MED ORDER — SODIUM CHLORIDE 0.9 % IV SOLN
1000.0000 mL | Freq: Once | INTRAVENOUS | Status: AC
  Administered 2016-04-13: 1000 mL via INTRAVENOUS

## 2016-04-13 MED ORDER — CIPROFLOXACIN HCL 250 MG PO TABS
500.0000 mg | ORAL_TABLET | Freq: Once | ORAL | Status: AC
  Administered 2016-04-13: 500 mg via ORAL

## 2016-04-13 MED ORDER — HYDROCODONE-ACETAMINOPHEN 5-325 MG PO TABS: 1.0000 | ORAL_TABLET | ORAL | 0 refills | Status: DC | PRN

## 2016-04-13 MED ORDER — PROCHLORPERAZINE EDISYLATE 5 MG/ML IJ SOLN
5.0000 mg | Freq: Once | INTRAMUSCULAR | Status: AC
  Administered 2016-04-13: 5 mg via INTRAVENOUS
  Filled 2016-04-13: qty 2

## 2016-04-13 MED ORDER — SODIUM CHLORIDE 0.9 % IV SOLN
1000.0000 mL | INTRAVENOUS | Status: DC
  Administered 2016-04-13: 1000 mL via INTRAVENOUS

## 2016-04-13 MED ORDER — PROMETHAZINE HCL 25 MG PO TABS: 25.0000 mg | ORAL_TABLET | Freq: Four times a day (QID) | ORAL | 0 refills | Status: DC | PRN

## 2016-04-13 NOTE — ED Notes (Signed)
Patient will notify staff when she can use the restroom.

## 2016-04-13 NOTE — ED Triage Notes (Signed)
Patient complaining of abdominal pain and diarrhea since yesterday.

## 2016-04-13 NOTE — Telephone Encounter (Signed)
Started feeling bad yesterday, bodyaches, bad diarrhea, fever from 99-100 and today its 101.7. Do you want to work her in tomorrow am?

## 2016-04-13 NOTE — ED Notes (Signed)
Family at bedside. 

## 2016-04-13 NOTE — Discharge Instructions (Signed)
Your examination and lab evaluation are consistent with food borne illness (food poisoning). Please increase fluids, particularly water. Please slowly increase your diet, but start with bland foods. Please use Cipro 2 times daily with food until all taken. Use promethazine for nausea, use Norco for pain.This medication may cause drowsiness. Please do not drink, drive, or participate in activity that requires concentration while taking this medication. Please see Dr. Moshe Cipro for follow-up in the office.

## 2016-04-13 NOTE — Telephone Encounter (Signed)
appt scheduled

## 2016-04-13 NOTE — ED Provider Notes (Signed)
[facility] DEPT Provider Note   CSN: [id number] Arrival date & time: 04/13/16  1747  First Provider Contact:  First MD Initiated Contact with Patient 04/13/16 1811        History   Chief Complaint Chief Complaint  Patient presents with  . Abdominal Pain  . Diarrhea    HPI [name] [name] is a 45 y.o. female.  Patient is a 45 year old female who presents to the emergency department with a complaint of abdominal pain and diarrhea and fever.  The patient states she was in her usual state of good health until yesterday, July 25 0.6 and 7 on p.m., when she began to have bouts with watery diarrhea. The patient states that within the last 24 hours she probably had 10 or more bouts with diarrhea. On yesterday she had a temperature between 101.9 and 100.9. She complains of body aches, chills, sweats, and abdominal pain all over, but worse in the upper abdomen area. The upper abdomen pain she describes as a cramping type sensation. It is of note that the patient thinks that she may have gotten hold of some bad food, as she got hot wings and potato salad off of a food bar. She states that in retrospect she thinks that the wings may have tasted a little different and she's not sure about the potato salad. She states that her mother had some of the same food, and she too had diarrhea, but did not have the abdominal pain or fever. The patient is not had any previous operations or procedures involving the abdomen is been no injury to the abdomen. The patient is not been on any antibiotics. The been no new over-the-counter medications, and the patient does not use in excess of nonsteroidal anti-inflammatory medications. Nothing seems to make the pain any better or any worse.      Past Medical History:  Diagnosis Date  . Anxiety   . Bursitis   . GERD (gastroesophageal reflux disease)   . Hypertension    seen by [facility]  . Palpitations   . PONV (postoperative nausea and  vomiting)   . Shortness of breath   . Venous insufficiency     Patient Active Problem List   Diagnosis Date Noted  . Allergic sinusitis 03/14/2016  . Headache 03/14/2016  . Intermittent palpitations 12/12/2015  . Paroxysmal atrial tachycardia ([place]) 12/12/2015  . Backache 11/23/2015  . Insomnia 11/23/2015  . IGT (impaired glucose tolerance) 01/10/2015  . Knee pain, left 12/16/2014  . Morbid obesity ([place]) 11/20/2012  . Vitamin D deficiency 11/20/2012  . Essential hypertension 04/05/2011  . PALPITATIONS 12/24/2008  . ANXIETY DISORDER, GENERALIZED 07/13/2007  . ALLERGIC RHINITIS, SEASONAL 07/13/2007  . GERD 07/13/2007    Past Surgical History:  Procedure Laterality Date  . ABDOMINAL HYSTERECTOMY  01/16/2016 [place])   partial for fibroids  . CESAREAN SECTION    . DILATION AND CURETTAGE OF UTERUS    . HYSTEROSCOPY W/D&C  04/02/2012   Procedure: DILATATION AND CURETTAGE /HYSTEROSCOPY;  Surgeon: Marvene Staff, MD;  Location: [facility];  Service: Gynecology;  Laterality: N/A;  . LAPAROSCOPIC TUBAL LIGATION  04/02/2012   Procedure: LAPAROSCOPIC TUBAL LIGATION;  Surgeon: Marvene Staff, MD;  Location: [facility];  Service: Gynecology;  Laterality: Bilateral;  . TRANSTHORACIC ECHOCARDIOGRAM  01/11/2009   EF >55%, normal    OB History    No data available       Home Medications    Prior to Admission medications   Medication Sig  Start Date End Date Taking? Authorizing Provider  acetaminophen (TYLENOL) 325 MG tablet Take 650 mg by mouth daily as needed for pain. 01/14/16 01/13/17 Yes Historical Provider, MD  ALPRAZolam Duanne Moron) 1 MG tablet Take 1 tablet (1 mg total) by mouth at bedtime as needed. for sleep 03/14/16  Yes Fayrene Helper, MD  amLODipine (NORVASC) 2.5 MG tablet TAKE 1 TABLET BY MOUTH EVERY DAY 03/14/16  Yes Fayrene Helper, MD  benzonatate (TESSALON) 100 MG capsule Take 1 capsule (100 mg total) by mouth 2 (two) times daily as needed for cough. 03/15/16  Yes Fayrene Helper, MD  BIOTIN PO Take 5,000 mg by mouth daily.   Yes Historical Provider, MD  citalopram (CELEXA) 20 MG tablet Take 1 tablet (20 mg total) by mouth daily. 03/14/16  Yes Fayrene Helper, MD  clotrimazole-betamethasone (LOTRISONE) cream Apply topically 2 (two) times daily as needed. Patient taking differently: Apply 1 application topically 2 (two) times daily as needed (rash or irritation).  12/17/14  Yes Fayrene Helper, MD  cyclobenzaprine (FLEXERIL) 10 MG tablet Take 10 mg by mouth 3 (three) times daily as needed for muscle spasms.   Yes Historical Provider, MD  fluticasone (FLONASE) 50 MCG/ACT nasal spray SHAKE LIQUID AND USE 2 SPRAYS IN EACH NOSTRIL DAILY AS NEEDED 02/16/16  Yes Fayrene Helper, MD  gabapentin (NEURONTIN) 100 MG capsule Take 1 capsule (100 mg total) by mouth at bedtime. 03/14/16  Yes Fayrene Helper, MD  loratadine (CLARITIN) 10 MG tablet TAKE 1 TABLET(10 MG) BY MOUTH DAILY 03/14/16  Yes Fayrene Helper, MD  NYSTATIN powder APPLY TOPICALLY TWICE DAILY 04/04/16  Yes Fayrene Helper, MD  omeprazole (PRILOSEC OTC) 20 MG tablet Take 20 mg by mouth daily.   Yes Historical Provider, MD  RESTASIS 0.05 % ophthalmic emulsion Place 1 drop into both eyes 2 (two) times daily.  07/16/13  Yes Historical Provider, MD  temazepam (RESTORIL) 15 MG capsule Take 1 capsule (15 mg total) by mouth at bedtime as needed for sleep. 11/23/15  Yes Fayrene Helper, MD  traMADol (ULTRAM) 50 MG tablet Take 1 tablet (50 mg total) by mouth daily as needed. Patient taking differently: Take 50 mg by mouth daily as needed for moderate pain.  03/14/16  Yes Fayrene Helper, MD  Vitamin D, Ergocalciferol, (DRISDOL) 50000 units CAPS capsule Take 1 capsule (50,000 Units total) by mouth every 7 (seven) days. Patient taking differently: Take 50,000 Units by mouth every 7 (seven) days. On Sunday. 03/14/16  Yes Fayrene Helper, MD  azithromycin [facility]) 250 MG tablet Use as directed Patient not  taking: Reported on 04/13/2016 03/15/16   Fayrene Helper, MD  predniSONE (STERAPRED UNI-PAK 21 TAB) 5 MG (21) TBPK tablet Take 1 tablet (5 mg total) by mouth as directed. Use as directed Patient not taking: Reported on 04/13/2016 03/14/16   Fayrene Helper, MD    Family History Family History  Problem Relation Age of Onset  . Cancer Father 109     prostate  . Diabetes Father   . Hypertension Father   . Hypertension Mother   . Hypertension Sister   . Diabetes Sister   . Hypertension Sister   . Diabetes Sister   . Cancer Paternal Aunt 51    breast cancer    Social History Social History  Substance Use Topics  . Smoking status: Never Smoker  . Smokeless tobacco: Never Used  . Alcohol use Yes     Comment:  rarely     Allergies   Diltiazem and Phentermine   Review of Systems Review of Systems  Constitutional: Positive for chills and fever.  Gastrointestinal: Positive for abdominal pain and diarrhea.  All other systems reviewed and are negative.    Physical Exam Updated Vital Signs BP 118/87   Pulse 111   Temp 98.9 F (37.2 C) (Oral)   Resp 20   Ht 4\' 11"  (1.499 m)   Wt 95.3 kg   LMP 11/17/2015   SpO2 96%   BMI 42.41 kg/m   Physical Exam  Abdominal: Soft. Normal appearance and bowel sounds are normal. She exhibits no distension, no fluid wave and no ascites. There is no hepatosplenomegaly. There is tenderness in the epigastric area.     ED Treatments / Results  Labs (all labs ordered are listed, but only abnormal results are displayed) Labs Reviewed  COMPREHENSIVE METABOLIC PANEL  LIPASE, BLOOD  CBC WITH DIFFERENTIAL/PLATELET  URINALYSIS, ROUTINE W REFLEX MICROSCOPIC (NOT AT [facility])  I-STAT CG4 LACTIC ACID, ED    EKG  EKG Interpretation None       Radiology No results found.  Procedures Procedures (including critical care time)  Medications Ordered in ED Medications  0.9 %  sodium chloride infusion (1,000 mLs Intravenous New Bag/Given  04/13/16 1850)    Followed by  0.9 %  sodium chloride infusion (not administered)  prochlorperazine (COMPAZINE) injection 5 mg (5 mg Intravenous Given 04/13/16 1850)  morphine 4 MG/ML injection 4 mg (4 mg Intravenous Given 04/13/16 1850)     Initial Impression / Assessment and Plan / ED Course  Recheck-patient feeling better after IV fluids and pain medications. No diarrhea since being treated here in the emergency department. Temperature remained stable. Heart rate improving.   I have reviewed the triage vital signs and the nursing notes.  Pertinent labs & imaging results that were available during my care of the patient were reviewed by me and considered in my medical decision making (see chart for details).  Clinical Course    **I have reviewed nursing notes, vital signs, and all appropriate lab and imaging results for this patient.*  Final Clinical Impressions(s) / ED Diagnoses  Lactic acid is within normal limits at 1.79. The complete blood count is well within normal limits. The comprehensive metabolic panel shows the potassium to be slightly low at 3.2, the CO2 low at 20. The liver functions are within normal limits. Complete blood count is within normal limits. Urinalysis is nonacute.  Suspect the patient has a foodborne illness. The patient will be treated with promethazine for nausea on. She will be given Norco to use for pain. She will use a three-day course of Cipro since she had temperature elevation of 101.9 prior to her emergency department evaluation. I strongly encouraged patient to see Dr. Moshe Cipro in the office for follow-up appointment. The patient is to return to the emergency department if any changes, problems, or concerns. At the time of discharge the patient is much more comfortable pain has significantly improved the patient was amateur without problem.    Final diagnoses:  Foodborne gastroenteritis    New Prescriptions New Prescriptions   No medications on file       Lily Kocher, Hershal Coria 04/13/16 [place], MD 04/27/16 [phone]

## 2016-04-14 ENCOUNTER — Ambulatory Visit: Payer: BC Managed Care – PPO | Admitting: Family Medicine

## 2016-04-14 ENCOUNTER — Other Ambulatory Visit: Payer: Self-pay

## 2016-04-14 ENCOUNTER — Other Ambulatory Visit: Payer: Self-pay | Admitting: Family Medicine

## 2016-04-14 DIAGNOSIS — R197 Diarrhea, unspecified: Secondary | ICD-10-CM

## 2016-04-14 MED ORDER — DIPHENOXYLATE-ATROPINE 2.5-0.025 MG PO TABS: 1.0000 | ORAL_TABLET | Freq: Four times a day (QID) | ORAL | 0 refills | Status: DC | PRN

## 2016-04-14 MED ORDER — DIPHENOXYLATE-ATROPINE 2.5-0.025 MG PO TABS: 1.0000 | ORAL_TABLET | Freq: Four times a day (QID) | ORAL | Status: DC | PRN

## 2016-04-14 NOTE — Telephone Encounter (Signed)
Med sent and patient aware 

## 2016-04-14 NOTE — Telephone Encounter (Signed)
pls let her  Know lomotil sent in

## 2016-04-14 NOTE — Telephone Encounter (Signed)
Went to ER lastnight and was suspected of having foodborne illness. Her diarrhea is terrible. Has taken 3 showers this am already. Only nausea med and cipro given at ER, nothing for diarrhea. Unable to make her appt this am. Wants something sent in for diarrhea to [facility]. Please advise

## 2016-04-27 ENCOUNTER — Other Ambulatory Visit (HOSPITAL_COMMUNITY): Payer: Self-pay | Admitting: Surgery

## 2016-05-04 ENCOUNTER — Encounter: Payer: BC Managed Care – PPO | Attending: Surgery | Admitting: Skilled Nursing Facility1

## 2016-05-04 ENCOUNTER — Encounter: Payer: Self-pay | Admitting: Skilled Nursing Facility1

## 2016-05-04 DIAGNOSIS — Z713 Dietary counseling and surveillance: Secondary | ICD-10-CM | POA: Insufficient documentation

## 2016-05-04 NOTE — Progress Notes (Signed)
  Pre-Op Assessment Visit:  Pre-Operative sleeve gastrectomy Surgery  Medical Nutrition Therapy:  Appt start time: 4:40  End time:  5:40  Patient was seen on 05/04/2016 for Pre-Operative Nutrition Assessment. Assessment and letter of approval faxed to [facility] Bariatric Surgery Program coordinator on 05/05/2016.   Preferred Learning Style:   No preference indicated   Learning Readiness:   Ready Handouts given during visit include:  Pre-Op Goals Bariatric Surgery Protein Shakes  During the appointment today the following Pre-Op Goals were reviewed with the patient: Maintain or lose weight as instructed by your surgeon Make healthy food choices Begin to limit portion sizes Limited concentrated sugars and fried foods Keep fat/sugar in the single digits per serving on   food labels Practice CHEWING your food  (aim for 30 chews per bite or until applesauce consistency) Practice not drinking 15 minutes before, during, and 30 minutes after each meal/snack Avoid all carbonated beverages  Avoid/limit caffeinated beverages  Avoid all sugar-sweetened beverages Consume 3 meals per day; eat every 3-5 hours Make a list of non-food related activities Aim for 64-100 ounces of FLUID daily  Aim for at least 60-80 grams of PROTEIN daily Look for a liquid protein source that contain ?15 g protein and ?5 g carbohydrate  (ex: shakes, drinks, shots)  Patient-Centered Goals: 10/10 specific/non-scale and confidence/importance scale 1-10  Demonstrated degree of understanding via:  Teach Back  Teaching Method Utilized:  Visual Auditory Hands on  Barriers to learning/adherence to lifestyle change: none identified   Patient to call the [facility] to enroll in Pre-Op and Post-Op Nutrition Education when surgery date is scheduled.

## 2016-05-06 ENCOUNTER — Encounter: Payer: Self-pay | Admitting: Skilled Nursing Facility1

## 2016-05-15 ENCOUNTER — Other Ambulatory Visit: Payer: Self-pay | Admitting: Family Medicine

## 2016-06-07 ENCOUNTER — Other Ambulatory Visit (HOSPITAL_COMMUNITY): Payer: Self-pay | Admitting: Surgery

## 2016-06-09 ENCOUNTER — Other Ambulatory Visit (HOSPITAL_COMMUNITY): Payer: Self-pay | Admitting: Surgery

## 2016-06-10 ENCOUNTER — Other Ambulatory Visit: Payer: Self-pay

## 2016-06-10 ENCOUNTER — Ambulatory Visit (HOSPITAL_COMMUNITY)
Admission: RE | Admit: 2016-06-10 | Discharge: 2016-06-10 | Disposition: A | Discharge status: Home / Self Care | Payer: BC Managed Care – PPO | Source: Ambulatory Visit | Attending: Surgery | Admitting: Surgery

## 2016-06-10 DIAGNOSIS — I517 Cardiomegaly: Secondary | ICD-10-CM | POA: Insufficient documentation

## 2016-06-10 DIAGNOSIS — Z01810 Encounter for preprocedural cardiovascular examination: Secondary | ICD-10-CM | POA: Insufficient documentation

## 2016-06-10 DIAGNOSIS — K449 Diaphragmatic hernia without obstruction or gangrene: Secondary | ICD-10-CM | POA: Diagnosis not present

## 2016-06-19 ENCOUNTER — Other Ambulatory Visit: Payer: Self-pay | Admitting: Family Medicine

## 2016-06-19 DIAGNOSIS — G4700 Insomnia, unspecified: Secondary | ICD-10-CM

## 2016-07-11 ENCOUNTER — Ambulatory Visit: Payer: BC Managed Care – PPO

## 2016-07-13 NOTE — Progress Notes (Signed)
Surgery on 08/15/16.  preop on 08/08/16.  Need orders in epic.  Thank You.

## 2016-07-21 ENCOUNTER — Ambulatory Visit: Payer: BC Managed Care – PPO | Admitting: Family Medicine

## 2016-07-26 ENCOUNTER — Encounter: Payer: Self-pay | Admitting: Family Medicine

## 2016-07-26 ENCOUNTER — Ambulatory Visit (INDEPENDENT_AMBULATORY_CARE_PROVIDER_SITE_OTHER): Payer: BC Managed Care – PPO | Admitting: Family Medicine

## 2016-07-26 VITALS — BP 128/82 | HR 102 | Resp 16 | Ht <= 58 in | Wt 223.0 lb

## 2016-07-26 DIAGNOSIS — K219 Gastro-esophageal reflux disease without esophagitis: Secondary | ICD-10-CM | POA: Diagnosis not present

## 2016-07-26 DIAGNOSIS — J301 Allergic rhinitis due to pollen: Secondary | ICD-10-CM

## 2016-07-26 DIAGNOSIS — F5101 Primary insomnia: Secondary | ICD-10-CM

## 2016-07-26 DIAGNOSIS — I10 Essential (primary) hypertension: Secondary | ICD-10-CM

## 2016-07-26 DIAGNOSIS — F411 Generalized anxiety disorder: Secondary | ICD-10-CM

## 2016-07-26 MED ORDER — FLUCONAZOLE 150 MG PO TABS: 150.0000 mg | ORAL_TABLET | Freq: Once | ORAL | 0 refills | Status: AC

## 2016-07-26 MED ORDER — AMLODIPINE BESYLATE 2.5 MG PO TABS: ORAL_TABLET | ORAL | 4 refills | Status: DC

## 2016-07-26 MED ORDER — OMEPRAZOLE MAGNESIUM 20 MG PO TBEC: 20.0000 mg | DELAYED_RELEASE_TABLET | Freq: Every day | ORAL | 4 refills | Status: DC

## 2016-07-26 MED ORDER — CITALOPRAM HYDROBROMIDE 20 MG PO TABS: 20.0000 mg | ORAL_TABLET | Freq: Every day | ORAL | 4 refills | Status: DC

## 2016-07-26 MED ORDER — ALPRAZOLAM 1 MG PO TABS: 1.0000 mg | ORAL_TABLET | Freq: Every evening | ORAL | 3 refills | Status: DC | PRN

## 2016-07-26 NOTE — Patient Instructions (Signed)
F/u in 4.5 month, call if you need me before  ALL the bEST with upcoming surgery, this PROVE your health  FOLLOW eating and exercise plan.  Continue current meds  Labs are great!  Thank you  for choosing [facility]. We consider it a privelige to serve you.  Delivering excellent health care in a caring and  compassionate way is our goal.  Partnering with you,  so that together we can achieve this goal is our strategy.    PLS come in for flu vaccine!

## 2016-07-28 ENCOUNTER — Ambulatory Visit: Payer: Self-pay | Admitting: Surgery

## 2016-07-28 NOTE — H&P (Signed)
[name] [name] Location: [facility] Patient #: [id number] DOB: 1971-01-20 Married / Language: English / Race: Black or African American Female   History of Present Illness  The patient is a 45 year old female who presents for a bariatric surgery evaluation. Symptoms include excess weight, inability to lose weight and fatigue. Symptoms are exacerbated by exercise intolerance. By report there is good compliance with treatment. Initial onset of obesity was in childhood. Initial presentation included abdominal obesity. Reported interest in weight loss is high. Diets tried in the past include low calorie. Pertinent family history includes obesity and diabetes. She is followed by Dr. Tula Nakayama and referred for surgery. She has a sister who is a Conservator, museum/gallery at [employer] who recommended CCS.   She is worried by her family history of obesity and DM. She has tried numerous diets without sustained success. She had GERD but doesn't snore and has an Epworth score of 4.   I explained sleeve gastrectomy to her in detail including complications not limited to bleeding and staple line leaks. Will begin workup toward sleeve gastrectomy   Other Problems [name] [name], CMA; 04/20/2016 2:17 PM) Gastroesophageal Reflux Disease High blood pressure  Past Surgical History [name] [name], CMA; 04/20/2016 2:17 PM) Hysterectomy (not due to cancer) - Partial  Diagnostic Studies History [name] [name], CMA; 04/20/2016 2:17 PM) Colonoscopy never  Allergies [name] [name], CMA; 04/20/2016 2:19 PM) DilTIAZem CD *CALCIUM CHANNEL BLOCKERS* Phentermine HCl *ADHD/ANTI-NARCOLEPSY/ANTI-OBESITY/ANOREXIANTS*  Medication History [name] [name], CMA; 04/20/2016 2:20 PM) ALPRAZolam (1MG  Tablet, Oral) Active. Temazepam (15MG  Capsule, Oral) Active. AmLODIPine Besylate (2.5MG  Tablet, Oral) Active. Citalopram Hydrobromide (20MG  Tablet, Oral) Active. Gabapentin (100MG  Capsule, Oral) Active. Loratadine  (10MG  Tablet, Oral) Active. Vitamin D (Ergocalciferol) (50000UNIT Capsule, Oral) Active. Biotin (10MG  Tablet, Oral) Active. Cyclobenzaprine HCl (10MG  Tablet, Oral) Active. Omeprazole (20MG  Capsule DR, Oral) Active. Nystatin (Topical) Active. Medications Reconciled  Social History [name] [name], CMA; 04/20/2016 2:17 PM) Alcohol use Occasional alcohol use. Caffeine use Carbonated beverages. No drug use Tobacco use Never smoker.  Family History [name] [name], CMA; 04/20/2016 2:17 PM) Arthritis Mother. Diabetes Mellitus Sister. Heart disease in female family member before age 85 Hypertension Father, Mother. Prostate Cancer Father.    Review of Systems [name] [name] CMA; 04/20/2016 2:17 PM) General Not Present- Appetite Loss, Chills, Fatigue, Fever, Night Sweats, Weight Gain and Weight Loss. Skin Not Present- Change in Wart/Mole, Dryness, Hives, Jaundice, New Lesions, Non-Healing Wounds, Rash and Ulcer. HEENT Present- Wears glasses/contact lenses. Not Present- Earache, Hearing Loss, Hoarseness, Nose Bleed, Oral Ulcers, Ringing in the Ears, Seasonal Allergies, Sinus Pain, Sore Throat, Visual Disturbances and Yellow Eyes. Respiratory Not Present- Bloody sputum, Chronic Cough, Difficulty Breathing, Snoring and Wheezing. Breast Not Present- Breast Mass, Breast Pain, Nipple Discharge and Skin Changes. Cardiovascular Not Present- Chest Pain, Difficulty Breathing Lying Down, Leg Cramps, Palpitations, Rapid Heart Rate, Shortness of Breath and Swelling of Extremities. Gastrointestinal Not Present- Abdominal Pain, Bloating, Bloody Stool, Change in Bowel Habits, Chronic diarrhea, Constipation, Difficulty Swallowing, Excessive gas, Gets full quickly at meals, Hemorrhoids, Indigestion, Nausea, Rectal Pain and Vomiting. Female Genitourinary Not Present- Frequency, Nocturia, Painful Urination, Pelvic Pain and Urgency. Musculoskeletal Not Present- Back Pain, Joint Pain, Joint Stiffness, Muscle  Pain, Muscle Weakness and Swelling of Extremities. Neurological Not Present- Decreased Memory, Fainting, Headaches, Numbness, Seizures, Tingling, Tremor, Trouble walking and Weakness. Psychiatric Not Present- Anxiety, Bipolar, Change in Sleep Pattern, Depression, Fearful and Frequent crying. Endocrine Not Present- Cold Intolerance, Excessive Hunger, Hair Changes, Heat Intolerance, Hot flashes and New Diabetes. Hematology  Not Present- Blood Thinners, Easy Bruising, Excessive bleeding, Gland problems, HIV and Persistent Infections.  Vitals ([name] [name] CMA; 04/20/2016 2:18 PM) 04/20/2016 2:17 PM Weight: 214.2 lb Height: 56.75in Body Surface Area: 1.85 m Body Mass Index: 46.76 kg/m  Temp.: 97.1F(Oral)  Pulse: 67 (Regular)  BP: 118/76 (Sitting, Left Arm, Standard)       Physical Exam  General Note: WDpleasantAAF NAD HEENT unremarkable Neck supple Chest clear Heart SR without murmurs (she does an episodic arrhymia) Abdomen is obese Ext FROM Neuro alert and oriented x 3 Good fund of knowledge and asked good questions.     Assessment & Plan [name] Key B. Hassell Done MD; 04/20/2016 3:01 PM) MORBID OBESITY, UNSPECIFIED OBESITY TYPE Impression: Will move toward working up and scheduling sleeve gastrectomy  [name] B. Hassell Done, MD, FACS

## 2016-07-31 ENCOUNTER — Encounter: Payer: Self-pay | Admitting: Family Medicine

## 2016-07-31 NOTE — Assessment & Plan Note (Signed)
Controlled, no change in medication  

## 2016-07-31 NOTE — Assessment & Plan Note (Signed)
Deteriorated. Patient re-educated about  the importance of commitment to a  minimum of 150 minutes of exercise per week.  The importance of healthy food choices with portion control discussed. Encouraged to start a food diary, count calories and to consider  joining a support group. Sample diet sheets offered. Goals set by the patient for the next several months.   Weight /BMI 07/26/2016 05/04/2016 04/13/2016  WEIGHT 223 lb 216 lb 210 lb  HEIGHT 4\' 9"  4\' 9"  4\' 11"   BMI 48.26 kg/m2 46.74 kg/m2 42.41 kg/m2

## 2016-07-31 NOTE — Assessment & Plan Note (Signed)
Sleep hygiene reviewed and written information offered also. Prescription sent for  medication needed.  

## 2016-07-31 NOTE — Assessment & Plan Note (Signed)
Controlled, no change in medication DASH diet and commitment to daily physical activity for a minimum of 30 minutes discussed and encouraged, as a part of hypertension management. The importance of attaining a healthy weight is also discussed.  BP/Weight 07/26/2016 05/04/2016 04/13/2016 03/14/2016 12/11/2015 11/23/2015 123456  Systolic BP 0000000 - 0000000 123456 [id number] 123456  Diastolic BP 82 - 87 74 78 82 87  Wt. (Lbs) 223 216 210 214 212 212 208.38  BMI 48.26 46.74 42.41 44.74 44.32 44.32 43.56

## 2016-07-31 NOTE — Progress Notes (Signed)
   [name] [name]     MRN: [id number]      DOB: March 04, 1971   HPI [name] [name] is here for follow up and re-evaluation of chronic medical conditions, medication management and review of any available recent lab and radiology data.  Preventive health is updated, specifically  Cancer screening and Immunization.   Has u[pcoming bariatric surgery and is excted about this though slightly scared The PT denies any adverse reactions to current medications since the last visit.  There are no new concerns.  There are no specific complaints   ROS Denies recent fever or chills. Denies sinus pressure, nasal congestion, ear pain or sore throat. Denies chest congestion, productive cough or wheezing. Denies chest pains, palpitations and leg swelling Denies abdominal pain, nausea, vomiting,diarrhea or constipation.   Denies dysuria, frequency, hesitancy or incontinence. Denies joint pain, swelling and limitation in mobility. Denies headaches, seizures, numbness, or tingling. Denies depression, anxiety or insomnia. Denies skin break down or rash.   PE  BP 128/82   Pulse (!) 102   Resp 16   Ht 4\' 9"  (1.448 m)   Wt 223 lb (101.2 kg)   LMP 11/17/2015   SpO2 98%   BMI 48.26 kg/m   Patient alert and oriented and in no cardiopulmonary distress.  HEENT: No facial asymmetry, EOMI,   oropharynx pink and moist.  Neck supple no JVD, no mass.  Chest: Clear to auscultation bilaterally.  CVS: S1, S2 no murmurs, no S3.Regular rate.  ABD: Soft non tender.   Ext: No edema  MS: Adequate ROM spine, shoulders, hips and knees.  Skin: Intact, no ulcerations or rash noted.  Psych: Good eye contact, normal affect. Memory intact not anxious or depressed appearing.  CNS: CN 2-12 intact, power,  normal throughout.no focal deficits noted.   Assessment & Plan  Essential hypertension Controlled, no change in medication DASH diet and commitment to daily physical activity for a minimum of 30 minutes discussed and  encouraged, as a part of hypertension management. The importance of attaining a healthy weight is also discussed.  BP/Weight 07/26/2016 05/04/2016 04/13/2016 03/14/2016 12/11/2015 11/23/2015 123456  Systolic BP 0000000 - 0000000 123456 [id number]  Diastolic BP 82 - 87 74 78 82 87  Wt. (Lbs) 223 216 210 214 212 212 208.38  BMI 48.26 46.74 42.41 44.74 44.32 44.32 43.56       GERD Controlled, no change in medication   ALLERGIC RHINITIS, SEASONAL Controlled, no change in medication   ANXIETY DISORDER, GENERALIZED Controlled, no change in medication   Morbid obesity Deteriorated. Patient re-educated about  the importance of commitment to a  minimum of 150 minutes of exercise per week.  The importance of healthy food choices with portion control discussed. Encouraged to start a food diary, count calories and to consider  joining a support group. Sample diet sheets offered. Goals set by the patient for the next several months.   Weight /BMI 07/26/2016 05/04/2016 04/13/2016  WEIGHT 223 lb 216 lb 210 lb  HEIGHT 4\' 9"  4\' 9"  4\' 11"   BMI 48.26 kg/m2 46.74 kg/m2 42.41 kg/m2      Insomnia Sleep hygiene reviewed and written information offered also. Prescription sent for  medication needed.

## 2016-08-01 ENCOUNTER — Encounter: Payer: BC Managed Care – PPO | Attending: Surgery | Admitting: Dietician

## 2016-08-01 DIAGNOSIS — Z713 Dietary counseling and surveillance: Secondary | ICD-10-CM | POA: Insufficient documentation

## 2016-08-02 ENCOUNTER — Encounter: Payer: Self-pay | Admitting: Dietician

## 2016-08-02 NOTE — Progress Notes (Signed)
  Pre-Operative Nutrition Class:  Appt start time: 0355   End time:  1830.  Patient was seen on 08/01/2016 for Pre-Operative Bariatric Surgery Education [facility].   Surgery date: 08/15/2016 Surgery type: sleeve gastrectomy Start weight at [facility]: 216 lbs on 05/04/2016 Weight today: 222.2 lbs  TANITA  BODY COMP RESULTS  08/01/16   BMI (kg/m^2) 48.1   Fat Mass (lbs) 114   Fat Free Mass (lbs) 108.2   Total Body Water (lbs) 78.6   Samples given per MNT protocol. Patient educated on appropriate usage: Bariatric Advantage Multivitamin (mixed fruit - qty 1) Lot #: [id number] Exp: 07/2017  Premier protein shake (strawberry - qty 1) Lot #: [id number] Exp: 05/2017  Celebrate Vitamins Calcium Citrate (Cafe Mocha - qty 1) Lot #: [id number]  Exp: 12/2017  The following the learning objectives were met by the patient during this course:  Identify Pre-Op Dietary Goals and will begin 2 weeks pre-operatively  Identify appropriate sources of fluids and proteins   State protein recommendations and appropriate sources pre and post-operatively  Identify Post-Operative Dietary Goals and will follow for 2 weeks post-operatively  Identify appropriate multivitamin and calcium sources  Describe the need for physical activity post-operatively and will follow MD recommendations  State when to call healthcare provider regarding medication questions or post-operative complications  Handouts given during class include:  Pre-Op Bariatric Surgery Diet Handout  Protein Shake Handout  Post-Op Bariatric Surgery Nutrition Handout  BELT Program Information Flyer  Support Group Information Flyer  [facility] Bariatric Supplements Price List  Follow-Up Plan: Patient will follow-up at [facility] 2 weeks post operatively for diet advancement per MD.

## 2016-08-04 ENCOUNTER — Encounter: Payer: Self-pay | Admitting: Family Medicine

## 2016-08-04 ENCOUNTER — Ambulatory Visit: Payer: Self-pay | Admitting: Surgery

## 2016-08-04 ENCOUNTER — Other Ambulatory Visit: Payer: Self-pay | Admitting: Family Medicine

## 2016-08-04 NOTE — H&P (Signed)
[name] [name] Location: [facility] Patient #: [id number] DOB: 08-28-1971 Married / Language: English / Race: Black or African American Female   History of Present Illness  The patient is a 45 year old female who presents for a bariatric surgery evaluation. Symptoms include excess weight, inability to lose weight and fatigue. Symptoms are exacerbated by exercise intolerance. By report there is good compliance with treatment. Initial onset of obesity was in childhood. Initial presentation included abdominal obesity. Reported interest in weight loss is high. Diets tried in the past include low calorie. Pertinent family history includes obesity and diabetes. She is followed by Dr. Tula Nakayama and referred for surgery. She has a sister who is a Conservator, museum/gallery at [employer] who recommended CCS.   She is worried by her family history of obesity and DM. She has tried numerous diets without sustained success. She had GERD but doesn't snore and has an Epworth score of 4.   I explained sleeve gastrectomy to her in detail including complications not limited to bleeding and staple line leaks. Will begin workup toward sleeve gastrectomy on Monday, Aug 15, 2016.    Other Problems  Gastroesophageal Reflux Disease High blood pressure  Past Surgical History  Hysterectomy (not due to cancer) - Partial  Diagnostic Studies History  Colonoscopy never  Allergies  DilTIAZem CD *CALCIUM CHANNEL BLOCKERS* Phentermine HCl *ADHD/ANTI-NARCOLEPSY/ANTI-OBESITY/ANOREXIANTS*  Medication History  ALPRAZolam (1MG  Tablet, Oral) Active. Temazepam (15MG  Capsule, Oral) Active. AmLODIPine Besylate (2.5MG  Tablet, Oral) Active. Citalopram Hydrobromide (20MG  Tablet, Oral) Active. Gabapentin (100MG  Capsule, Oral) Active. Loratadine (10MG  Tablet, Oral) Active. Vitamin D (Ergocalciferol) (50000UNIT Capsule, Oral) Active. Biotin (10MG  Tablet, Oral) Active. Cyclobenzaprine HCl (10MG  Tablet,  Oral) Active. Omeprazole (20MG  Capsule DR, Oral) Active. Nystatin (Topical) Active. Medications Reconciled  Social History Alcohol use Occasional alcohol use. Caffeine use Carbonated beverages. No drug use Tobacco use Never smoker.  Family History  Arthritis Mother. Diabetes Mellitus Sister. Heart disease in female family member before age 14 Hypertension Father, Mother. Prostate Cancer Father.    Review of Systems  General Not Present- Appetite Loss, Chills, Fatigue, Fever, Night Sweats, Weight Gain and Weight Loss. Skin Not Present- Change in Wart/Mole, Dryness, Hives, Jaundice, New Lesions, Non-Healing Wounds, Rash and Ulcer. HEENT Present- Wears glasses/contact lenses. Not Present- Earache, Hearing Loss, Hoarseness, Nose Bleed, Oral Ulcers, Ringing in the Ears, Seasonal Allergies, Sinus Pain, Sore Throat, Visual Disturbances and Yellow Eyes. Respiratory Not Present- Bloody sputum, Chronic Cough, Difficulty Breathing, Snoring and Wheezing. Breast Not Present- Breast Mass, Breast Pain, Nipple Discharge and Skin Changes. Cardiovascular Not Present- Chest Pain, Difficulty Breathing Lying Down, Leg Cramps, Palpitations, Rapid Heart Rate, Shortness of Breath and Swelling of Extremities. Gastrointestinal Not Present- Abdominal Pain, Bloating, Bloody Stool, Change in Bowel Habits, Chronic diarrhea, Constipation, Difficulty Swallowing, Excessive gas, Gets full quickly at meals, Hemorrhoids, Indigestion, Nausea, Rectal Pain and Vomiting. Female Genitourinary Not Present- Frequency, Nocturia, Painful Urination, Pelvic Pain and Urgency. Musculoskeletal Not Present- Back Pain, Joint Pain, Joint Stiffness, Muscle Pain, Muscle Weakness and Swelling of Extremities. Neurological Not Present- Decreased Memory, Fainting, Headaches, Numbness, Seizures, Tingling, Tremor, Trouble walking and Weakness. Psychiatric Not Present- Anxiety, Bipolar, Change in Sleep Pattern, Depression, Fearful and  Frequent crying. Endocrine Not Present- Cold Intolerance, Excessive Hunger, Hair Changes, Heat Intolerance, Hot flashes and New Diabetes. Hematology Not Present- Blood Thinners, Easy Bruising, Excessive bleeding, Gland problems, HIV and Persistent Infections.  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     Assessment & Plan Rodman Key B. Hassell Done MD; 04/20/2016 3:01 PM) MORBID OBESITY, UNSPECIFIED OBESITY TYPE  Plan Sleeve gastrectomy.  Will look for small transient hiatal hernia seen on UGI.    Matt B. Hassell Done, MD, FACS

## 2016-08-05 ENCOUNTER — Encounter: Payer: Self-pay | Admitting: Family Medicine

## 2016-08-08 ENCOUNTER — Encounter (HOSPITAL_COMMUNITY): Payer: Self-pay

## 2016-08-08 ENCOUNTER — Encounter (HOSPITAL_COMMUNITY)
Admission: RE | Admit: 2016-08-08 | Discharge: 2016-08-08 | Disposition: A | Discharge status: Home / Self Care | Payer: BC Managed Care – PPO | Source: Ambulatory Visit | Attending: Surgery | Admitting: Surgery

## 2016-08-08 DIAGNOSIS — Z01818 Encounter for other preprocedural examination: Secondary | ICD-10-CM | POA: Diagnosis not present

## 2016-08-08 LAB — COMPREHENSIVE METABOLIC PANEL
ALBUMIN: 3.9 g/dL (ref 3.5–5.0)
ALK PHOS: 78 U/L (ref 38–126)
ALT: 13 U/L — AB (ref 14–54)
ANION GAP: 7 (ref 5–15)
AST: 18 U/L (ref 15–41)
BILIRUBIN TOTAL: 0.5 mg/dL (ref 0.3–1.2)
BUN: 14 mg/dL (ref 6–20)
CALCIUM: 9.2 mg/dL (ref 8.9–10.3)
CO2: 25 mmol/L (ref 22–32)
CREATININE: 0.77 mg/dL (ref 0.44–1.00)
Chloride: 105 mmol/L (ref 101–111)
GFR calc non Af Amer: >60 mL/min (ref >60)
GLUCOSE: 89 mg/dL (ref 65–99)
Potassium: 3.9 mmol/L (ref 3.5–5.1)
Sodium: 137 mmol/L (ref 135–145)
TOTAL PROTEIN: 7.9 g/dL (ref 6.5–8.1)

## 2016-08-08 LAB — CBC WITH DIFFERENTIAL/PLATELET
Basophils Absolute: 0 10*3/uL (ref 0.0–0.1)
Basophils Relative: 0 %
Eosinophils Absolute: 0.2 10*3/uL (ref 0.0–0.7)
Eosinophils Relative: 3 %
HEMATOCRIT: 39.9 % (ref 36.0–46.0)
HEMOGLOBIN: 13.1 g/dL (ref 12.0–15.0)
LYMPHS ABS: 2.5 10*3/uL (ref 0.7–4.0)
Lymphocytes Relative: 36 %
MCH: 28.7 pg (ref 26.0–34.0)
MCHC: 32.8 g/dL (ref 30.0–36.0)
MCV: 87.5 fL (ref 78.0–100.0)
MONOS PCT: 7 %
Monocytes Absolute: 0.5 10*3/uL (ref 0.1–1.0)
NEUTROS ABS: 3.8 10*3/uL (ref 1.7–7.7)
NEUTROS PCT: 54 %
Platelets: 418 10*3/uL — ABNORMAL HIGH (ref 150–400)
RBC: 4.56 MIL/uL (ref 3.87–5.11)
RDW: 13.3 % (ref 11.5–15.5)
WBC: 7 10*3/uL (ref 4.0–10.5)

## 2016-08-08 LAB — SURGICAL PCR SCREEN
MRSA, PCR: NEGATIVE
Staphylococcus aureus: NEGATIVE

## 2016-08-08 NOTE — Patient Instructions (Signed)
CALENE BARTOSCH  08/08/2016   Your procedure is scheduled on: 08/15/16    Report to [facility] Main  Entrance take [facility]  elevators to 3rd floor to  [place] at AM.  Call this number if you have problems the morning of surgery [phone]   Remember: ONLY 1 PERSON MAY GO WITH YOU TO SHORT STAY TO GET  READY MORNING OF [place].  Do not eat food or drink liquids :After Midnight.     Take these medicines the morning of surgery with A SIP OF WATER: Amlodipine ( NOrvasc), Celexa, Flonase, Claritin, Prilosec, eye drops                                 You may not have any metal on your body including hair pins and              piercings  Do not wear jewelry, make-up, lotions, powders or perfumes, deodorant             Do not wear nail polish.  Do not shave  48 hours prior to surgery.     Do not bring valuables to the hospital. [place].  Contacts, dentures or bridgework may not be worn into surgery.  Leave suitcase in the car. After surgery it may be brought to your room.        Special Instructions: N/A              Please read over the following fact sheets you were given: _____________________________________________________________________             [facility] - Preparing for Surgery Before surgery, you can play an important role.  Because skin is not sterile, your skin needs to be as free of germs as possible.  You can reduce the number of germs on your skin by washing with CHG (chlorahexidine gluconate) soap before surgery.  CHG is an antiseptic cleaner which kills germs and bonds with the skin to continue killing germs even after washing. Please DO NOT use if you have an allergy to CHG or antibacterial soaps.  If your skin becomes reddened/irritated stop using the CHG and inform your nurse when you arrive at Short Stay. Do not shave (including legs and underarms) for at least 48 hours prior  to the first CHG shower.  You may shave your face/neck. Please follow these instructions carefully:  1.  Shower with CHG Soap the night before surgery and the  morning of Surgery.  2.  If you choose to wash your hair, wash your hair first as usual with your  normal  shampoo.  3.  After you shampoo, rinse your hair and body thoroughly to remove the  shampoo.                           4.  Use CHG as you would any other liquid soap.  You can apply chg directly  to the skin and wash                       Gently with a scrungie or clean washcloth.  5.  Apply the CHG Soap to  your body ONLY FROM THE NECK DOWN.   Do not use on face/ open                           Wound or open sores. Avoid contact with eyes, ears mouth and genitals (private parts).                       Wash face,  Genitals (private parts) with your normal soap.             6.  Wash thoroughly, paying special attention to the area where your surgery  will be performed.  7.  Thoroughly rinse your body with warm water from the neck down.  8.  DO NOT shower/wash with your normal soap after using and rinsing off  the CHG Soap.                9.  Pat yourself dry with a clean towel.            10.  Wear clean pajamas.            11.  Place clean sheets on your bed the night of your first shower and do not  sleep with pets. Day of Surgery : Do not apply any lotions/deodorants the morning of surgery.  Please wear clean clothes to the hospital/surgery center.  FAILURE TO FOLLOW THESE INSTRUCTIONS MAY RESULT IN THE CANCELLATION OF YOUR SURGERY PATIENT SIGNATURE_________________________________  NURSE SIGNATURE__________________________________  ________________________________________________________________________

## 2016-08-08 NOTE — Progress Notes (Signed)
EKG nad CXR- 06/10/16- EPIC

## 2016-08-15 ENCOUNTER — Encounter (HOSPITAL_COMMUNITY): Payer: Self-pay | Admitting: *Deleted

## 2016-08-15 ENCOUNTER — Inpatient Hospital Stay (HOSPITAL_COMMUNITY): Payer: BC Managed Care – PPO | Admitting: Anesthesiology

## 2016-08-15 ENCOUNTER — Inpatient Hospital Stay (HOSPITAL_COMMUNITY)
Admission: RE | Admit: 2016-08-15 | Discharge: 2016-08-17 | DRG: 621 | Disposition: A | Discharge status: Home / Self Care | Payer: BC Managed Care – PPO | Source: Ambulatory Visit | Attending: Surgery | Admitting: Surgery

## 2016-08-15 ENCOUNTER — Encounter (HOSPITAL_COMMUNITY)
Admission: RE | Disposition: A | Discharge status: Home / Self Care | Payer: Self-pay | Source: Ambulatory Visit | Attending: Surgery

## 2016-08-15 DIAGNOSIS — I10 Essential (primary) hypertension: Secondary | ICD-10-CM | POA: Diagnosis present

## 2016-08-15 DIAGNOSIS — Z8249 Family history of ischemic heart disease and other diseases of the circulatory system: Secondary | ICD-10-CM

## 2016-08-15 DIAGNOSIS — Z6841 Body Mass Index (BMI) 40.0 and over, adult: Secondary | ICD-10-CM

## 2016-08-15 DIAGNOSIS — K449 Diaphragmatic hernia without obstruction or gangrene: Secondary | ICD-10-CM | POA: Diagnosis present

## 2016-08-15 DIAGNOSIS — Z90711 Acquired absence of uterus with remaining cervical stump: Secondary | ICD-10-CM

## 2016-08-15 DIAGNOSIS — K219 Gastro-esophageal reflux disease without esophagitis: Secondary | ICD-10-CM | POA: Diagnosis present

## 2016-08-15 DIAGNOSIS — Z8042 Family history of malignant neoplasm of prostate: Secondary | ICD-10-CM

## 2016-08-15 DIAGNOSIS — Z9884 Bariatric surgery status: Secondary | ICD-10-CM

## 2016-08-15 DIAGNOSIS — N994 Postprocedural pelvic peritoneal adhesions: Secondary | ICD-10-CM | POA: Diagnosis present

## 2016-08-15 HISTORY — PX: LAPAROSCOPIC GASTRIC SLEEVE RESECTION WITH HIATAL HERNIA REPAIR: SHX6512

## 2016-08-15 LAB — CBC
HEMATOCRIT: 42.1 % (ref 36.0–46.0)
HEMOGLOBIN: 14.5 g/dL (ref 12.0–15.0)
MCH: 29.4 pg (ref 26.0–34.0)
MCHC: 34.4 g/dL (ref 30.0–36.0)
MCV: 85.2 fL (ref 78.0–100.0)
Platelets: 348 10*3/uL (ref 150–400)
RBC: 4.94 MIL/uL (ref 3.87–5.11)
RDW: 13.5 % (ref 11.5–15.5)
WBC: 11.3 10*3/uL — ABNORMAL HIGH (ref 4.0–10.5)

## 2016-08-15 LAB — CREATININE, SERUM
Creatinine, Ser: 0.78 mg/dL (ref 0.44–1.00)
GFR calc Af Amer: >60 mL/min (ref >60)
GFR calc non Af Amer: >60 mL/min (ref >60)

## 2016-08-15 LAB — HEMOGLOBIN AND HEMATOCRIT, BLOOD
HCT: 40.1 % (ref 36.0–46.0)
Hemoglobin: 13.6 g/dL (ref 12.0–15.0)

## 2016-08-15 SURGERY — GASTRECTOMY, SLEEVE, LAPAROSCOPIC, WITH HIATAL HERNIA REPAIR
Anesthesia: General

## 2016-08-15 MED ORDER — LIDOCAINE HCL (CARDIAC) 20 MG/ML IV SOLN
INTRAVENOUS | Status: DC | PRN
  Administered 2016-08-15: 100 mg via INTRAVENOUS

## 2016-08-15 MED ORDER — HYDROMORPHONE HCL 1 MG/ML IJ SOLN: 0.2500 mg | INTRAMUSCULAR | Status: DC | PRN

## 2016-08-15 MED ORDER — PREMIER PROTEIN SHAKE
2.0000 [oz_av] | ORAL | Status: DC
  Administered 2016-08-17 (×5): 2 [oz_av] via ORAL

## 2016-08-15 MED ORDER — MIDAZOLAM HCL 5 MG/5ML IJ SOLN
INTRAMUSCULAR | Status: DC | PRN
  Administered 2016-08-15: 2 mg via INTRAVENOUS

## 2016-08-15 MED ORDER — ACETAMINOPHEN 160 MG/5ML PO SOLN
325.0000 mg | ORAL | Status: DC | PRN
Start: 2016-08-16 — End: 2016-08-17

## 2016-08-15 MED ORDER — 0.9 % SODIUM CHLORIDE (POUR BTL) OPTIME
TOPICAL | Status: DC | PRN
  Administered 2016-08-15: 1000 mL

## 2016-08-15 MED ORDER — SUGAMMADEX SODIUM 500 MG/5ML IV SOLN
INTRAVENOUS | Status: AC
  Filled 2016-08-15: qty 5

## 2016-08-15 MED ORDER — HEPARIN SODIUM (PORCINE) 5000 UNIT/ML IJ SOLN
5000.0000 [IU] | INTRAMUSCULAR | Status: AC
  Administered 2016-08-15: 5000 [IU] via SUBCUTANEOUS
  Filled 2016-08-15: qty 1

## 2016-08-15 MED ORDER — CYCLOSPORINE 0.05 % OP EMUL
1.0000 [drp] | Freq: Two times a day (BID) | OPHTHALMIC | Status: DC
  Administered 2016-08-15 – 2016-08-17 (×5): 1 [drp] via OPHTHALMIC
  Filled 2016-08-15 (×6): qty 1

## 2016-08-15 MED ORDER — SODIUM CHLORIDE 0.9 % IJ SOLN
INTRAMUSCULAR | Status: AC
  Filled 2016-08-15: qty 20

## 2016-08-15 MED ORDER — DIPHENHYDRAMINE HCL 50 MG/ML IJ SOLN
INTRAMUSCULAR | Status: DC | PRN
  Administered 2016-08-15: 12.5 mg via INTRAVENOUS

## 2016-08-15 MED ORDER — ONDANSETRON HCL 4 MG/2ML IJ SOLN
INTRAMUSCULAR | Status: AC
  Filled 2016-08-15: qty 2

## 2016-08-15 MED ORDER — CEFOTETAN DISODIUM-DEXTROSE 2-2.08 GM-% IV SOLR
2.0000 g | INTRAVENOUS | Status: AC
  Administered 2016-08-15: 2 g via INTRAVENOUS

## 2016-08-15 MED ORDER — OXYCODONE HCL 5 MG/5ML PO SOLN
5.0000 mg | ORAL | Status: DC | PRN
  Administered 2016-08-16 (×2): 5 mg via ORAL
  Administered 2016-08-16 – 2016-08-17 (×3): 10 mg via ORAL
  Filled 2016-08-15 (×2): qty 10
  Filled 2016-08-15 (×2): qty 5
  Filled 2016-08-15: qty 10

## 2016-08-15 MED ORDER — LACTATED RINGERS IV SOLN
INTRAVENOUS | Status: DC | PRN
  Administered 2016-08-15 (×2): via INTRAVENOUS

## 2016-08-15 MED ORDER — DEXAMETHASONE SODIUM PHOSPHATE 10 MG/ML IJ SOLN
INTRAMUSCULAR | Status: AC
  Filled 2016-08-15: qty 1

## 2016-08-15 MED ORDER — DIPHENHYDRAMINE HCL 50 MG/ML IJ SOLN
INTRAMUSCULAR | Status: AC
  Filled 2016-08-15: qty 1

## 2016-08-15 MED ORDER — LACTATED RINGERS IR SOLN
Status: DC | PRN
  Administered 2016-08-15: 3000 mL

## 2016-08-15 MED ORDER — FENTANYL CITRATE (PF) 250 MCG/5ML IJ SOLN
INTRAMUSCULAR | Status: DC | PRN
  Administered 2016-08-15: 100 ug via INTRAVENOUS
  Administered 2016-08-15: 50 ug via INTRAVENOUS
  Administered 2016-08-15: 100 ug via INTRAVENOUS

## 2016-08-15 MED ORDER — SUGAMMADEX SODIUM 200 MG/2ML IV SOLN
INTRAVENOUS | Status: AC
  Filled 2016-08-15: qty 6

## 2016-08-15 MED ORDER — CITALOPRAM HYDROBROMIDE 20 MG PO TABS
20.0000 mg | ORAL_TABLET | Freq: Every day | ORAL | Status: DC
  Administered 2016-08-17: 20 mg via ORAL
  Filled 2016-08-15 (×2): qty 1

## 2016-08-15 MED ORDER — ROCURONIUM BROMIDE 50 MG/5ML IV SOSY
PREFILLED_SYRINGE | INTRAVENOUS | Status: AC
  Filled 2016-08-15: qty 5

## 2016-08-15 MED ORDER — LIDOCAINE 2% (20 MG/ML) 5 ML SYRINGE
INTRAMUSCULAR | Status: AC
  Filled 2016-08-15: qty 5

## 2016-08-15 MED ORDER — PROPOFOL 10 MG/ML IV BOLUS
INTRAVENOUS | Status: DC | PRN
  Administered 2016-08-15: 150 mg via INTRAVENOUS

## 2016-08-15 MED ORDER — CHLORHEXIDINE GLUCONATE CLOTH 2 % EX PADS: 6.0000 | MEDICATED_PAD | Freq: Once | CUTANEOUS | Status: DC

## 2016-08-15 MED ORDER — SCOPOLAMINE 1 MG/3DAYS TD PT72
MEDICATED_PATCH | TRANSDERMAL | Status: DC | PRN
  Administered 2016-08-15: 1 via TRANSDERMAL

## 2016-08-15 MED ORDER — PANTOPRAZOLE SODIUM 40 MG IV SOLR
40.0000 mg | Freq: Every day | INTRAVENOUS | Status: DC
  Administered 2016-08-15 – 2016-08-16 (×2): 40 mg via INTRAVENOUS
  Filled 2016-08-15 (×2): qty 40

## 2016-08-15 MED ORDER — SUGAMMADEX SODIUM 500 MG/5ML IV SOLN
INTRAVENOUS | Status: DC | PRN
  Administered 2016-08-15: 300 mg via INTRAVENOUS

## 2016-08-15 MED ORDER — MIDAZOLAM HCL 2 MG/2ML IJ SOLN
INTRAMUSCULAR | Status: AC
  Filled 2016-08-15: qty 2

## 2016-08-15 MED ORDER — PROPOFOL 10 MG/ML IV BOLUS
INTRAVENOUS | Status: AC
  Filled 2016-08-15: qty 20

## 2016-08-15 MED ORDER — HEPARIN SODIUM (PORCINE) 5000 UNIT/ML IJ SOLN
5000.0000 [IU] | Freq: Three times a day (TID) | INTRAMUSCULAR | Status: DC
  Administered 2016-08-15 – 2016-08-17 (×6): 5000 [IU] via SUBCUTANEOUS
  Filled 2016-08-15 (×7): qty 1

## 2016-08-15 MED ORDER — KCL IN DEXTROSE-NACL 20-5-0.45 MEQ/L-%-% IV SOLN
INTRAVENOUS | Status: DC
  Administered 2016-08-15 – 2016-08-17 (×5): via INTRAVENOUS
  Filled 2016-08-15 (×6): qty 1000

## 2016-08-15 MED ORDER — SUCCINYLCHOLINE CHLORIDE 20 MG/ML IJ SOLN
INTRAMUSCULAR | Status: DC | PRN
  Administered 2016-08-15: 100 mg via INTRAVENOUS

## 2016-08-15 MED ORDER — SUCCINYLCHOLINE CHLORIDE 200 MG/10ML IV SOSY
PREFILLED_SYRINGE | INTRAVENOUS | Status: AC
  Filled 2016-08-15: qty 10

## 2016-08-15 MED ORDER — HYDROMORPHONE HCL 1 MG/ML IJ SOLN
INTRAMUSCULAR | Status: DC | PRN
Start: 2016-08-15 — End: 2016-08-15
  Administered 2016-08-15: 1 mg via INTRAVENOUS

## 2016-08-15 MED ORDER — ONDANSETRON HCL 4 MG/2ML IJ SOLN
INTRAMUSCULAR | Status: DC | PRN
  Administered 2016-08-15: 4 mg via INTRAVENOUS

## 2016-08-15 MED ORDER — MORPHINE SULFATE (PF) 2 MG/ML IV SOLN
2.0000 mg | INTRAVENOUS | Status: DC | PRN
  Administered 2016-08-15 – 2016-08-16 (×7): 2 mg via INTRAVENOUS
  Filled 2016-08-15 (×7): qty 1

## 2016-08-15 MED ORDER — PROMETHAZINE HCL 25 MG/ML IJ SOLN: 6.2500 mg | INTRAMUSCULAR | Status: DC | PRN

## 2016-08-15 MED ORDER — ROCURONIUM BROMIDE 100 MG/10ML IV SOLN
INTRAVENOUS | Status: DC | PRN
Start: 2016-08-15 — End: 2016-08-15
  Administered 2016-08-15: 30 mg via INTRAVENOUS
  Administered 2016-08-15: 50 mg via INTRAVENOUS

## 2016-08-15 MED ORDER — FENTANYL CITRATE (PF) 250 MCG/5ML IJ SOLN
INTRAMUSCULAR | Status: AC
  Filled 2016-08-15: qty 5

## 2016-08-15 MED ORDER — APREPITANT 80 MG PO CAPS
80.0000 mg | ORAL_CAPSULE | Freq: Once | ORAL | Status: AC
  Administered 2016-08-15: 80 mg via ORAL
  Filled 2016-08-15: qty 1

## 2016-08-15 MED ORDER — AMLODIPINE BESYLATE 5 MG PO TABS
2.5000 mg | ORAL_TABLET | Freq: Every day | ORAL | Status: DC
Start: 2016-08-16 — End: 2016-08-17
  Administered 2016-08-17: 2.5 mg via ORAL
  Filled 2016-08-15 (×2): qty 1

## 2016-08-15 MED ORDER — ACETAMINOPHEN 160 MG/5ML PO SOLN: 650.0000 mg | ORAL | Status: DC | PRN

## 2016-08-15 MED ORDER — ONDANSETRON HCL 4 MG/2ML IJ SOLN
4.0000 mg | INTRAMUSCULAR | Status: DC | PRN
  Administered 2016-08-16 (×2): 4 mg via INTRAVENOUS
  Filled 2016-08-15 (×2): qty 2

## 2016-08-15 MED ORDER — SCOPOLAMINE 1 MG/3DAYS TD PT72
MEDICATED_PATCH | TRANSDERMAL | Status: AC
  Filled 2016-08-15: qty 1

## 2016-08-15 MED ORDER — BUPIVACAINE LIPOSOME 1.3 % IJ SUSP
20.0000 mL | Freq: Once | INTRAMUSCULAR | Status: AC
  Administered 2016-08-15: 20 mL
  Filled 2016-08-15: qty 20

## 2016-08-15 MED ORDER — DEXAMETHASONE SODIUM PHOSPHATE 10 MG/ML IJ SOLN
INTRAMUSCULAR | Status: DC | PRN
  Administered 2016-08-15: 10 mg via INTRAVENOUS

## 2016-08-15 MED ORDER — PHENYLEPHRINE 40 MCG/ML (10ML) SYRINGE FOR IV PUSH (FOR BLOOD PRESSURE SUPPORT)
PREFILLED_SYRINGE | INTRAVENOUS | Status: AC
  Filled 2016-08-15: qty 10

## 2016-08-15 MED ORDER — HYDROMORPHONE HCL 2 MG/ML IJ SOLN
INTRAMUSCULAR | Status: AC
  Filled 2016-08-15: qty 1

## 2016-08-15 MED ORDER — PHENYLEPHRINE HCL 10 MG/ML IJ SOLN
INTRAMUSCULAR | Status: DC | PRN
  Administered 2016-08-15: 80 ug via INTRAVENOUS

## 2016-08-15 MED ORDER — CEFOTETAN DISODIUM-DEXTROSE 2-2.08 GM-% IV SOLR
INTRAVENOUS | Status: AC
  Filled 2016-08-15: qty 50

## 2016-08-15 MED ORDER — SODIUM CHLORIDE 0.9 % IJ SOLN
INTRAMUSCULAR | Status: DC | PRN
  Administered 2016-08-15: 10 mL

## 2016-08-15 SURGICAL SUPPLY — 68 items
ADH SKN CLS APL DERMABOND .7 (GAUZE/BANDAGES/DRESSINGS) ×1
APL SRG 32X5 SNPLK LF DISP (MISCELLANEOUS)
APPLICATOR COTTON TIP 6IN STRL (MISCELLANEOUS) ×1 IMPLANT
APPLIER CLIP ROT 10 11.4 M/L (STAPLE)
APPLIER CLIP ROT 13.4 12 LRG (CLIP)
APR CLP LRG 13.4X12 ROT 20 MLT (CLIP)
APR CLP MED LRG 11.4X10 (STAPLE)
BAG SPEC RTRVL LRG 6X4 10 (ENDOMECHANICALS)
BLADE SURG 15 STRL LF DISP TIS (BLADE) ×1 IMPLANT
BLADE SURG 15 STRL SS (BLADE) ×2
CABLE HIGH FREQUENCY MONO STRZ (ELECTRODE) ×1 IMPLANT
CLIP APPLIE ROT 10 11.4 M/L (STAPLE) IMPLANT
CLIP APPLIE ROT 13.4 12 LRG (CLIP) IMPLANT
COVER SURGICAL LIGHT HANDLE (MISCELLANEOUS) ×1 IMPLANT
DERMABOND ADVANCED (GAUZE/BANDAGES/DRESSINGS) ×1
DERMABOND ADVANCED .7 DNX12 (GAUZE/BANDAGES/DRESSINGS) IMPLANT
DEVICE SUT QUICK LOAD TK 5 (STAPLE) ×1 IMPLANT
DEVICE SUT TI-KNOT TK 5X26 (MISCELLANEOUS) IMPLANT
DEVICE SUTURE ENDOST 10MM (ENDOMECHANICALS) ×1 IMPLANT
DEVICE TROCAR PUNCTURE CLOSURE (ENDOMECHANICALS) ×2 IMPLANT
DISSECTOR BLUNT TIP ENDO 5MM (MISCELLANEOUS) ×1 IMPLANT
ELECT REM PT RETURN 9FT ADLT (ELECTROSURGICAL) ×2
ELECTRODE REM PT RTRN 9FT ADLT (ELECTROSURGICAL) ×1 IMPLANT
GAUZE SPONGE 4X4 12PLY STRL (GAUZE/BANDAGES/DRESSINGS) IMPLANT
GLOVE BIOGEL M 8.0 STRL (GLOVE) ×2 IMPLANT
GOWN STRL REUS W/TWL XL LVL3 (GOWN DISPOSABLE) ×7 IMPLANT
HANDLE STAPLE EGIA 4 XL (STAPLE) ×2 IMPLANT
HOVERMATT SINGLE USE (MISCELLANEOUS) ×2 IMPLANT
IRRIG SUCT STRYKERFLOW 2 WTIP (MISCELLANEOUS) ×2
IRRIGATION SUCT STRKRFLW 2 WTP (MISCELLANEOUS) ×1 IMPLANT
KIT BASIN OR (CUSTOM PROCEDURE TRAY) ×2 IMPLANT
MARKER SKIN DUAL TIP RULER LAB (MISCELLANEOUS) ×2 IMPLANT
NDL SPNL 22GX3.5 QUINCKE BK (NEEDLE) ×1 IMPLANT
NEEDLE SPNL 22GX3.5 QUINCKE BK (NEEDLE) ×2 IMPLANT
PACK UNIVERSAL I (CUSTOM PROCEDURE TRAY) ×2 IMPLANT
POUCH SPECIMEN RETRIEVAL 10MM (ENDOMECHANICALS) IMPLANT
RELOAD ENDO STITCH (ENDOMECHANICALS) ×2 IMPLANT
RELOAD STAPLE 45 PURP MED/THCK (STAPLE) IMPLANT
RELOAD SUT TRIPLE-STITCH 2-0 (ENDOMECHANICALS) IMPLANT
RELOAD TRI 45 ART MED THCK BLK (STAPLE) ×2 IMPLANT
RELOAD TRI 45 ART MED THCK PUR (STAPLE) ×2 IMPLANT
RELOAD TRI 60 ART MED THCK BLK (STAPLE) ×2 IMPLANT
RELOAD TRI 60 ART MED THCK PUR (STAPLE) ×3 IMPLANT
SCISSORS LAP 5X45 EPIX DISP (ENDOMECHANICALS) ×1 IMPLANT
SEALANT SURGICAL APPL DUAL CAN (MISCELLANEOUS) IMPLANT
SHEARS HARMONIC ACE PLUS 45CM (MISCELLANEOUS) ×2 IMPLANT
SLEEVE ADV FIXATION 5X100MM (TROCAR) ×4 IMPLANT
SLEEVE GASTRECTOMY 36FR VISIGI (MISCELLANEOUS) ×2 IMPLANT
SOLUTION ANTI FOG 6CC (MISCELLANEOUS) ×2 IMPLANT
SPONGE LAP 18X18 X RAY DECT (DISPOSABLE) ×2 IMPLANT
STAPLER VISISTAT 35W (STAPLE) IMPLANT
SUT VIC AB 4-0 SH 18 (SUTURE) ×2 IMPLANT
SUT VICRYL 0 TIES 12 18 (SUTURE) ×2 IMPLANT
SYR 10ML ECCENTRIC (SYRINGE) ×2 IMPLANT
SYR 20CC LL (SYRINGE) ×2 IMPLANT
SYR 50ML LL SCALE MARK (SYRINGE) ×2 IMPLANT
SYSTEM WECK SHIELD CLOSURE (TROCAR) ×1 IMPLANT
TOWEL OR 17X26 10 PK STRL BLUE (TOWEL DISPOSABLE) ×2 IMPLANT
TOWEL OR NON WOVEN STRL DISP B (DISPOSABLE) ×2 IMPLANT
TRAY FOLEY W/METER SILVER 16FR (SET/KITS/TRAYS/PACK) IMPLANT
TROCAR ADV FIXATION 12X100MM (TROCAR) ×2 IMPLANT
TROCAR ADV FIXATION 5X100MM (TROCAR) ×2 IMPLANT
TROCAR BLADELESS 15MM (ENDOMECHANICALS) ×2 IMPLANT
TROCAR BLADELESS OPT 5 100 (ENDOMECHANICALS) ×2 IMPLANT
TUBE CALIBRATION LAPBAND (TUBING) IMPLANT
TUBING CONNECTING 10 (TUBING) ×2 IMPLANT
TUBING ENDO SMARTCAP (MISCELLANEOUS) ×2 IMPLANT
TUBING INSUF HEATED (TUBING) ×2 IMPLANT

## 2016-08-15 NOTE — Interval H&P Note (Signed)
History and Physical Interval Note:  08/15/2016 7:08 AM  [name] [name]  has presented today for surgery, with the diagnosis of MORBID OBESITY  The various methods of treatment have been discussed with the patient and family. After consideration of risks, benefits and other options for treatment, the patient has consented to  Procedure(s): LAPAROSCOPIC GASTRIC SLEEVE RESECTION WITH HIATAL HERNIA REPAIR WITH UPPER ENDO (N/A) as a surgical intervention .  The patient's history has been reviewed, patient examined, no change in status, stable for surgery.  I have reviewed the patient's chart and labs.  Questions were answered to the patient's satisfaction.     Astor Gentle B

## 2016-08-15 NOTE — Op Note (Addendum)
Name:  [name] [name] MRN: [id number] Date of Surgery: 08/15/2016  Preop Diagnosis:  Morbid Obesity  Postop Diagnosis:  Morbid Obesity (Weight - 214, BMI - 46.8), S/P Gastric Sleeve  Procedure:  Upper endoscopy  (Intraoperative)  Surgeon:  Alphonsa Overall, M.D.  Anesthesia:  GET  Indications for procedure: [name] is a 45 y.o. female whose primary care physician is Tula Nakayama, MD and has completed a Gastric Sleeve today by Dr. Hassell Done.  I am doing an intraoperative upper endoscopy to evaluate the gastric pouch.  Operative Note: The patient is under general anesthesia.  Dr. Hassell Done is laparoscoping the patient while I do an upper endoscopy to evaluate the stomach pouch.  With the patient intubated, I passed the Olympus upper endoscope without difficulty down the esophagus.  The esophagus was unremarkable.  The esophago-gastric junction was at 37 cm.    The mucosa of the stomach looked viable and the staple line was intact without bleeding.  I advanced to the pylorus, but did not go through it.  While I insufflated the stomach pouch with air, Dr. Hassell Done  flooded the upper abdomen with saline to put the gastric pouch under saline.  There was no bubbling or evidence of a leak.  There was no evidence of narrowing of the pouch and the gastric sleeve looked tubular.  The scope was then withdrawn.  The esophagus was unremarkable and the patient tolerated the endoscopy without difficulty.  Alphonsa Overall, MD, [facility] Pager: [phone] Office phone:  [phone]

## 2016-08-15 NOTE — Anesthesia Procedure Notes (Signed)
Procedure Name: Intubation Date/Time: 08/15/2016 7:32 AM Performed by: Chandra Batch A Pre-anesthesia Checklist: Patient identified, Timeout performed, Emergency Drugs available, Suction available and Patient being monitored Patient Re-evaluated:Patient Re-evaluated prior to inductionOxygen Delivery Method: Circle system utilized Preoxygenation: Pre-oxygenation with 100% oxygen Intubation Type: IV induction Ventilation: Two handed mask ventilation required Laryngoscope Size: Mac and 3 Grade View: Grade II Tube type: Oral Tube size: 7.5 mm Number of attempts: 2 Airway Equipment and Method: Stylet Placement Confirmation: ETT inserted through vocal cords under direct vision,  positive ETCO2 and breath sounds checked- equal and bilateral Secured at: 21 cm Tube secured with: Tape Dental Injury: Teeth and Oropharynx as per pre-operative assessment

## 2016-08-15 NOTE — Op Note (Signed)
Surgeon: Kaylyn Lim, MD, FACS  Asst:  Alphonsa Overall, MD, FACS  Anes:  General endotracheal  Procedure: Two suture repair of visible hiatal hernia with GERD; Laparoscopic sleeve gastrectomy and upper endoscopy  Diagnosis: Morbid obesity  Complications: none  EBL:   minimal cc  Description of Procedure:  The patient was take to OR 1 and given general anesthesia.  The abdomen was prepped with Technicare and draped sterilely.  A timeout was performed.  Access to the abdomen was achieved with a 5 mm trocar through the left upper quadrant without difficulty.  Lower midline adhesions were noted to the lower midline from prior hysterectomy.  Following insufflation, the state of the abdomen was found to be free in the upper abdomen.  A hiatal hernia was visible.  A posterior dissection showed the right and left crurae and these were brought together with two sutures using the endostitch and 0 surgidek.  Calibration tubing inserted and inflated and showed no migration into the chest with pullback.  The ViSiGi 36Fr tube was inserted to deflate the stomach and was pulled back into the esophagus.    The pylorus was identified and we measured 5 cm back and marked the antrum.  At that point we began dissection to take down the greater curvature of the stomach using the Harmonic scalpel.  This dissection was taken all the way up to the left crus.  Posterior attachments of the stomach were also taken down.    The ViSiGi tube was then passed into the antrum and suction applied so that it was snug along the lessor curvature.  The "crow's foot" or incisura was identified.  The sleeve gastrectomy was begun using the [employer] stapler beginning with a 4.5 mm black load with TRS followed by a 6 cm black with TRS.  Subsequent applications with the purple loads completed the sleeve gastrectomy.  When the sleeve was complete the tube was taken off suction and insufflated briefly.  The tube was withdrawn.  Upper  endoscopy was then performed by Dr. Lucia Gaskins.  The 15 mm port was closed with a Weck closure device.  Inspection for bleeding was negative.     The specimen was extracted through the 15 trocar site.  Wounds were infiltrated with Exparel and closed with 4-0 Monocryl and Dermabond.    Matt B. Hassell Done, [place], [facility], [place]

## 2016-08-15 NOTE — Discharge Instructions (Addendum)
Aprepitant Discharge Instructions  °On the day of surgery you were given the medication aprepitant. This medication interacts with hormonal forms of birth control (oral contraceptives and injected or implanted birth control) and may make them ineffective. °IF YOU USE ANY HORMONAL FORM OF BIRTH CONTROL, YOU MUST USE AN ADDITIONAL BARRIER BIRTH CONTROL METHOD FOR ONE MONTH after receiving aprepitant or there is a chance you could become pregnant. ° ° ° ° °GASTRIC BYPASS/SLEEVE ° Home Care Instructions ° ° These instructions are to help you care for yourself when you go home. ° °Call: If you have any problems. °• Call [phone] and ask for the surgeon on call °• If you need immediate assistance come to the ER at [facility]. Tell the ER staff you are a new post-op gastric bypass or gastric sleeve patient  °Signs and symptoms to report: • Severe  vomiting or nausea °o If you cannot handle clear liquids for longer than 1 day, call your surgeon °• Abdominal pain which does not get better after taking your pain medication °• Fever greater than 100.4°  F and chills °• Heart rate over 100 beats a minute °• Trouble breathing °• Chest pain °• Redness,  swelling, drainage, or foul odor at incision (surgical) sites °• If your incisions open or pull apart °• Swelling or pain in calf (lower leg) °• Diarrhea (Loose bowel movements that happen often), frequent watery, uncontrolled bowel movements °• Constipation, (no bowel movements for 3 days) if this happens: °o Take Milk of Magnesia, 2 tablespoons by mouth, 3 times a day for 2 days if needed °o Stop taking Milk of Magnesia once you have had a bowel movement °o Call your doctor if constipation continues °Or °o Take Miralax  (instead of Milk of Magnesia) following the label instructions °o Stop taking Miralax once you have had a bowel movement °o Call your doctor if constipation continues °• Anything you think is “abnormal for you” °  °Normal side effects after surgery: • Unable  to sleep at night or unable to concentrate °• Irritability °• Being tearful (crying) or depressed ° °These are common complaints, possibly related to your anesthesia, stress of surgery, and change in lifestyle, that usually go away a few weeks after surgery. If these feelings continue, call your medical doctor.  °Wound Care: You may have surgical glue, steri-strips, or staples over your incisions after surgery °• Surgical glue: Looks like clear film over your incisions and will wear off a little at a time °• Steri-strips: Adhesive strips of tape over your incisions. You may notice a yellowish color on skin under the steri-strips. This is used to make the steri-strips stick better. Do not pull the steri-strips off - let them fall off °• Staples: Staples may be removed before you leave the hospital °o If you go home with staples, call [facility] for an appointment with your surgeon’s nurse to have staples removed 10 days after surgery, [phone] °• Showering: You may shower two (2) days after your surgery unless your surgeon tells you differently °o Wash gently around incisions with warm soapy water, rinse well, and gently pat dry °o If you have a drain (tube from your incision), you may need someone to hold this while you shower °o No tub baths until staples are removed and incisions are healed °  °Medications: • Medications should be liquid or crushed if larger than the size of a dime °• Extended release pills (medication that releases a little bit at   a time through the  day) should not be crushed °• Depending on the size and number of medications you take, you may need to space (take a few throughout the day)/change the time you take your medications so that you do not over-fill your pouch (smaller stomach) °• Make sure you follow-up with you primary care physician to make medication changes needed during rapid weight loss and life -style changes °• If you have diabetes, follow up with your  doctor that orders your diabetes medication(s) within one week after surgery and check your blood sugar regularly ° °• Do not drive while taking narcotics (pain medications) ° °• Do not take acetaminophen (Tylenol) and Roxicet or Lortab Elixir at the same time since these pain medications contain acetaminophen °  °Diet:  °First 2 Weeks You will see the nutritionist about two (2) weeks after your surgery. The nutritionist will increase the types of foods you can eat if you are handling liquids well: °• If you have severe vomiting or nausea and cannot handle clear liquids lasting longer than 1 day call your surgeon °Protein Shake °• Drink at least 2 ounces of shake 5-6 times per day °• Each serving of protein shakes (usually 8-12 ounces) should have a minimum of: °o 15 grams of protein °o And no more than 5 grams of carbohydrate °• Goal for protein each day: °o Men = 80 grams per day °o Women = 60 grams per day °  ° • Protein powder may be added to fluids such as non-fat milk or Lactaid milk or Soy milk (limit to 35 grams added protein powder per serving) ° °Hydration °• Slowly increase the amount of water and other clear liquids as tolerated (See Acceptable Fluids) °• Slowly increase the amount of protein shake as tolerated °• Sip fluids slowly and throughout the day °• May use sugar substitutes in small amounts (no more than 6-8 packets per day; i.e. Splenda) ° °Fluid Goal °• The first goal is to drink at least 8 ounces of protein shake/drink per day (or as directed by the nutritionist); some examples of protein shakes are Syntrax Nectar, Adkins Advantage, EAS Edge HP, and Unjury. - See handout from pre-op Bariatric Education Class: °o Slowly increase the amount of protein shake you drink as tolerated °o You may find it easier to slowly sip shakes throughout the day °o It is important to get your proteins in first °• Your fluid goal is to drink 64-100 ounces of fluid daily °o It may take a few weeks to build up to  this  °• 32 oz. (or more) should be clear liquids °And °• 32 oz. (or more) should be full liquids (see below for examples) °• Liquids should not contain sugar, caffeine, or carbonation ° °Clear Liquids: °• Water of Sugar-free flavored water (i.e. Fruit H²O, Propel) °• Decaffeinated coffee or tea (sugar-free) °• Crystal lite, Wyler’s Lite, Minute Maid Lite °• Sugar-free Jell-O °• Bouillon or broth °• Sugar-free Popsicle:    - Less than 20 calories each; Limit 1 per day ° °Full Liquids: °                  Protein Shakes/Drinks + 2 choices per day of other full liquids °• Full liquids must be: °o No More Than 12 grams of Carbs per serving °o No More Than 3 grams of Fat per serving °• Strained low-fat cream soup °• Non-Fat milk °• Fat-free Lactaid Milk °• Sugar-free yogurt (Dannon Lite & Fit, Greek   yogurt) ° °  °Vitamins and Minerals • Start 1 day after surgery unless otherwise directed by your surgeon °• 2 Chewable Multivitamin / Multimineral Supplement with iron (i.e. Centrum for Adults) °• Vitamin B-12, 350-500 micrograms sub-lingual (place tablet under the tongue) each day °• Chewable Calcium Citrate with Vitamin D-3 °(Example: 3 Chewable Calcium  Plus 600 with Vitamin D-3) °o Take 500 mg three (3) times a day for a total of 1500 mg each day °o Do not take all 3 doses of calcium at one time as it may cause constipation, and you can only absorb 500 mg at a time °o Do not mix multivitamins containing iron with calcium supplements;  take 2 hours apart °o Do not substitute Tums (calcium carbonate) for your calcium °• Menstruating women and those at risk for anemia ( a blood disease that causes weakness) may need extra iron °o Talk to your doctor to see if you need more iron °• If you need extra iron: Total daily Iron recommendation (including Vitamins) is 50 to 100 mg Iron/day °• Do not stop taking or change any vitamins or minerals until you talk to your nutritionist or surgeon °• Your nutritionist and/or surgeon must  approve all vitamin and mineral supplements °  °Activity and Exercise: It is important to continue walking at home. Limit your physical activity as instructed by your doctor. During this time, use these guidelines: °• Do not lift anything greater than ten  (10) pounds for at least two (2) weeks °• Do not go back to work or drive until your surgeon says you can °• You may have sex when you feel comfortable °o It is VERY important for female patients to use a reliable birth control method; fertility often increase after surgery °o Do not get pregnant for at least 18 months °• Start exercising as soon as your doctor tells you that you can °o Make sure your doctor approves any physical activity °• Start with a simple walking program °• Walk 5-15 minutes each day, 7 days per week °• Slowly increase until you are walking 30-45 minutes per day °• Consider joining our BELT program. [phone] or email [email or web address] °  °Special Instructions Things to remember: °• Free counseling is available for you and your family through collaboration between [place] and [employer]. Please call [phone] and leave a message °• Use your CPAP when sleeping if this applies to you °• Consider buying a medical alert bracelet that says you had lap-band surgery °  °  You will likely have your first fill (fluid added to your band) 6 - 8 weeks after surgery °• [facility] has a free Bariatric Surgery Support Group that meets monthly, the 3rd Thursday, 6pm. [employer] [facility] Classrooms. You can see classes online at [email or web address] °• It is very important to keep all follow up appointments with your surgeon, nutritionist, primary care physician, and behavioral health practitioner °o After the first year, please follow up with your bariatric surgeon and nutritionist at least once a year in order to maintain best weight loss results °      °             [facility]:  [phone] ° °             [facility]: [phone] ° °             Bariatric Nurse Coordinator: [phone]  °Gastric Bypass/Sleeve Home Care   Instructions  Rev. 10/2012    ° °                                                    Reviewed and Endorsed °                                                   by [place] Patient Education Committee, Jan, 2014 ° ° ° ° ° ° ° ° ° °

## 2016-08-15 NOTE — Transfer of Care (Signed)
Immediate Anesthesia Transfer of Care Note  Patient: [name] [name]  Procedure(s) Performed: Procedure(s): LAPAROSCOPIC GASTRIC SLEEVE RESECTION WITH HIATAL HERNIA REPAIR WITH UPPER ENDO (N/A)  Patient Location: PACU  Anesthesia Type:General  Level of Consciousness: alert , oriented and sedated  Airway & Oxygen Therapy: Patient Spontanous Breathing and Patient connected to face mask oxygen  Post-op Assessment: Report given to RN and Post -op Vital signs reviewed and stable  Post vital signs: Reviewed and stable  Last Vitals:  Vitals:   08/15/16 0535 08/15/16 0955  BP: 118/71 127/74  Pulse: (!) 58 98  Resp: 16 12  Temp: 36.8 C     Last Pain:  Vitals:   08/15/16 0535  TempSrc: Oral      Patients Stated Pain Goal: 4 ([id number])  Complications: No apparent anesthesia complications

## 2016-08-15 NOTE — Anesthesia Preprocedure Evaluation (Signed)
Anesthesia Evaluation  Patient identified by MRN, date of birth, ID band Patient awake    Reviewed: Allergy & Precautions, NPO status , Patient's Chart, lab work & pertinent test results  History of Anesthesia Complications (+) PONV  Airway Mallampati: III  TM Distance: >3 FB Neck ROM: Full    Dental no notable dental hx. (+) Dental Advisory Given   Pulmonary neg pulmonary ROS,    Pulmonary exam normal        Cardiovascular hypertension, negative cardio ROS Normal cardiovascular exam     Neuro/Psych PSYCHIATRIC DISORDERS Anxiety negative neurological ROS     GI/Hepatic Neg liver ROS, GERD  Medicated and Controlled,  Endo/Other  Morbid obesity  Renal/GU negative Renal ROS  negative genitourinary   Musculoskeletal negative musculoskeletal ROS (+)   Abdominal (+) + obese,   Peds negative pediatric ROS (+)  Hematology negative hematology ROS (+)   Anesthesia Other Findings   Reproductive/Obstetrics negative OB ROS                             Anesthesia Physical Anesthesia Plan  ASA: III  Anesthesia Plan: General   Post-op Pain Management:    Induction: Intravenous  Airway Management Planned: Oral ETT  Additional Equipment:   Intra-op Plan:   Post-operative Plan: Extubation in OR  Informed Consent: I have reviewed the patients History and Physical, chart, labs and discussed the procedure including the risks, benefits and alternatives for the proposed anesthesia with the patient or authorized representative who has indicated his/her understanding and acceptance.   Dental advisory given  Plan Discussed with: CRNA, Anesthesiologist and Surgeon  Anesthesia Plan Comments:         Anesthesia Quick Evaluation

## 2016-08-15 NOTE — H&P (View-Only) (Signed)
[name] [name] Location: [facility] Patient #: [id number] DOB: January 08, 1971 Married / Language: English / Race: Black or African American Female   History of Present Illness  The patient is a 45 year old female who presents for a bariatric surgery evaluation. Symptoms include excess weight, inability to lose weight and fatigue. Symptoms are exacerbated by exercise intolerance. By report there is good compliance with treatment. Initial onset of obesity was in childhood. Initial presentation included abdominal obesity. Reported interest in weight loss is high. Diets tried in the past include low calorie. Pertinent family history includes obesity and diabetes. She is followed by Dr. Tula Nakayama and referred for surgery. She has a sister who is a Conservator, museum/gallery at [employer] who recommended [facility].   She is worried by her family history of obesity and DM. She has tried numerous diets without sustained success. She had GERD but doesn't snore and has an Epworth score of 4.   I explained sleeve gastrectomy to her in detail including complications not limited to bleeding and staple line leaks. Will begin workup toward sleeve gastrectomy on Monday, Aug 15, 2016.    Other Problems  Gastroesophageal Reflux Disease High blood pressure  Past Surgical History  Hysterectomy (not due to cancer) - Partial  Diagnostic Studies History  Colonoscopy never  Allergies  DilTIAZem CD *CALCIUM CHANNEL BLOCKERS* Phentermine HCl *ADHD/ANTI-NARCOLEPSY/ANTI-OBESITY/ANOREXIANTS*  Medication History  ALPRAZolam (1MG  Tablet, Oral) Active. Temazepam (15MG  Capsule, Oral) Active. AmLODIPine Besylate (2.5MG  Tablet, Oral) Active. Citalopram Hydrobromide (20MG  Tablet, Oral) Active. Gabapentin (100MG  Capsule, Oral) Active. Loratadine (10MG  Tablet, Oral) Active. Vitamin D (Ergocalciferol) (50000UNIT Capsule, Oral) Active. Biotin (10MG  Tablet, Oral) Active. Cyclobenzaprine HCl (10MG  Tablet,  Oral) Active. Omeprazole (20MG  Capsule DR, Oral) Active. Nystatin (Topical) Active. Medications Reconciled  Social History Alcohol use Occasional alcohol use. Caffeine use Carbonated beverages. No drug use Tobacco use Never smoker.  Family History  Arthritis Mother. Diabetes Mellitus Sister. Heart disease in female family member before age 61 Hypertension Father, Mother. Prostate Cancer Father.    Review of Systems  General Not Present- Appetite Loss, Chills, Fatigue, Fever, Night Sweats, Weight Gain and Weight Loss. Skin Not Present- Change in Wart/Mole, Dryness, Hives, Jaundice, New Lesions, Non-Healing Wounds, Rash and Ulcer. HEENT Present- Wears glasses/contact lenses. Not Present- Earache, Hearing Loss, Hoarseness, Nose Bleed, Oral Ulcers, Ringing in the Ears, Seasonal Allergies, Sinus Pain, Sore Throat, Visual Disturbances and Yellow Eyes. Respiratory Not Present- Bloody sputum, Chronic Cough, Difficulty Breathing, Snoring and Wheezing. Breast Not Present- Breast Mass, Breast Pain, Nipple Discharge and Skin Changes. Cardiovascular Not Present- Chest Pain, Difficulty Breathing Lying Down, Leg Cramps, Palpitations, Rapid Heart Rate, Shortness of Breath and Swelling of Extremities. Gastrointestinal Not Present- Abdominal Pain, Bloating, Bloody Stool, Change in Bowel Habits, Chronic diarrhea, Constipation, Difficulty Swallowing, Excessive gas, Gets full quickly at meals, Hemorrhoids, Indigestion, Nausea, Rectal Pain and Vomiting. Female Genitourinary Not Present- Frequency, Nocturia, Painful Urination, Pelvic Pain and Urgency. Musculoskeletal Not Present- Back Pain, Joint Pain, Joint Stiffness, Muscle Pain, Muscle Weakness and Swelling of Extremities. Neurological Not Present- Decreased Memory, Fainting, Headaches, Numbness, Seizures, Tingling, Tremor, Trouble walking and Weakness. Psychiatric Not Present- Anxiety, Bipolar, Change in Sleep Pattern, Depression, Fearful and  Frequent crying. Endocrine Not Present- Cold Intolerance, Excessive Hunger, Hair Changes, Heat Intolerance, Hot flashes and New Diabetes. Hematology Not Present- Blood Thinners, Easy Bruising, Excessive bleeding, Gland problems, HIV and Persistent Infections.  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     Assessment & Plan Rodman Key B. Hassell Done MD; 04/20/2016 3:01 PM) MORBID OBESITY, UNSPECIFIED OBESITY TYPE  Plan Sleeve gastrectomy.  Will look for small transient hiatal hernia seen on UGI.    Matt B. Hassell Done, MD, FACS

## 2016-08-15 NOTE — Anesthesia Postprocedure Evaluation (Signed)
Anesthesia Post Note  Patient: [name] [name]  Procedure(s) Performed: Procedure(s) (LRB): LAPAROSCOPIC GASTRIC SLEEVE RESECTION WITH HIATAL HERNIA REPAIR WITH UPPER ENDO (N/A)  Patient location during evaluation: PACU Anesthesia Type: General Level of consciousness: sedated Pain management: pain level controlled Vital Signs Assessment: post-procedure vital signs reviewed and stable Respiratory status: spontaneous breathing and respiratory function stable Cardiovascular status: stable Anesthetic complications: no    Last Vitals:  Vitals:   08/15/16 1030 08/15/16 1045  BP: (!) 143/90 (!) 139/91  Pulse: (!) 109 99  Resp: 20 12  Temp:  36.5 C    Last Pain:  Vitals:   08/15/16 1030  TempSrc:   PainSc: Asleep                 Aven Christen DANIEL

## 2016-08-16 ENCOUNTER — Encounter (HOSPITAL_COMMUNITY): Payer: Self-pay | Admitting: Surgery

## 2016-08-16 LAB — CBC WITH DIFFERENTIAL/PLATELET
BASOS PCT: 0 %
Basophils Absolute: 0 10*3/uL (ref 0.0–0.1)
EOS ABS: 0 10*3/uL (ref 0.0–0.7)
Eosinophils Relative: 0 %
HEMATOCRIT: 42.3 % (ref 36.0–46.0)
HEMOGLOBIN: 14.3 g/dL (ref 12.0–15.0)
LYMPHS ABS: 1.3 10*3/uL (ref 0.7–4.0)
Lymphocytes Relative: 11 %
MCH: 29.3 pg (ref 26.0–34.0)
MCHC: 33.8 g/dL (ref 30.0–36.0)
MCV: 86.7 fL (ref 78.0–100.0)
MONO ABS: 0.6 10*3/uL (ref 0.1–1.0)
MONOS PCT: 5 %
Neutro Abs: 9.5 10*3/uL — ABNORMAL HIGH (ref 1.7–7.7)
Neutrophils Relative %: 84 %
Platelets: 422 10*3/uL — ABNORMAL HIGH (ref 150–400)
RBC: 4.88 MIL/uL (ref 3.87–5.11)
RDW: 13.3 % (ref 11.5–15.5)
WBC: 11.3 10*3/uL — ABNORMAL HIGH (ref 4.0–10.5)

## 2016-08-16 LAB — HEMOGLOBIN AND HEMATOCRIT, BLOOD
HEMATOCRIT: 38.9 % (ref 36.0–46.0)
HEMOGLOBIN: 13.5 g/dL (ref 12.0–15.0)

## 2016-08-16 NOTE — Plan of Care (Signed)
Problem: Food- and Nutrition-Related Knowledge Deficit (NB-1.1) Goal: Nutrition education Formal process to instruct or train a patient/client in a skill or to impart knowledge to help patients/clients voluntarily manage or modify food choices and eating behavior to maintain or improve health. Outcome: Completed/Met Date Met: 08/16/16 Nutrition Education Note  Received consult for diet education per DROP protocol.   Discussed 2 week post op diet with pt. Emphasized that liquids must be non carbonated, non caffeinated, and sugar free. Fluid goals discussed. Reviewed progression of diet to include soft proteins at 7-10 days post-op. Pt to follow up with outpatient bariatric RD for further diet progression after 2 weeks. Multivitamins and minerals also reviewed. Teach back method used, pt expressed understanding, expect good compliance.   Diet: First 2 Weeks  You will see the dietitian about two (2) weeks after your surgery. The dietitian will increase the types of foods you can eat if you are handling liquids well:  If you have severe vomiting or nausea and cannot handle clear liquids lasting longer than 1 day, call your surgeon  Protein Shake  Drink at least 2 ounces of shake 5-6 times per day  Each serving of protein shakes (usually 8 - 12 ounces) should have a minimum of:  15 grams of protein  And no more than 5 grams of carbohydrate  Goal for protein each day:  Men = 80 grams per day  Women = 60 grams per day  Protein powder may be added to fluids such as non-fat milk or Lactaid milk or Soy milk (limit to 35 grams added protein powder per serving)   Hydration  Slowly increase the amount of water and other clear liquids as tolerated (See Acceptable Fluids)  Slowly increase the amount of protein shake as tolerated  Sip fluids slowly and throughout the day  May use sugar substitutes in small amounts (no more than 6 - 8 packets per day; i.e. Splenda)   Fluid Goal  The first goal is to  drink at least 8 ounces of protein shake/drink per day (or as directed by the nutritionist); some examples of protein shakes are Syntrax Nectar, Adkins Advantage, EAS Edge HP, and Unjury. See handout from pre-op Bariatric Education Class:  Slowly increase the amount of protein shake you drink as tolerated  You may find it easier to slowly sip shakes throughout the day  It is important to get your proteins in first  Your fluid goal is to drink 64 - 100 ounces of fluid daily  It may take a few weeks to build up to this  32 oz (or more) should be clear liquids  And  32 oz (or more) should be full liquids (see below for examples)  Liquids should not contain sugar, caffeine, or carbonation   Clear Liquids:  Water or Sugar-free flavored water (i.e. Fruit H2O, Propel)  Decaffeinated coffee or tea (sugar-free)  Crystal Lite, Wyler's Lite, Minute Maid Lite  Sugar-free Jell-O  Bouillon or broth  Sugar-free Popsicle: *Less than 20 calories each; Limit 1 per day   Full Liquids:  Protein Shakes/Drinks + 2 choices per day of other full liquids  Full liquids must be:  No More Than 12 grams of Carbs per serving  No More Than 3 grams of Fat per serving  Strained low-fat cream soup  Non-Fat milk  Fat-free Lactaid Milk  Sugar-free yogurt (Dannon Lite & Fit, Greek yogurt)     [name] Lantry, MS, RD, LDN Pager: [phone] After Hours Pager: [phone]    

## 2016-08-16 NOTE — Progress Notes (Signed)
Patient alert and oriented, Post op day 1.  Provided support and encouragement.  Encouraged pulmonary toilet, ambulation and small sips of liquids.  All questions answered.  Will continue to monitor. 

## 2016-08-17 LAB — CBC WITH DIFFERENTIAL/PLATELET
BASOS ABS: 0 10*3/uL (ref 0.0–0.1)
BASOS PCT: 0 %
Eosinophils Absolute: 0.2 10*3/uL (ref 0.0–0.7)
Eosinophils Relative: 2 %
HEMATOCRIT: 40.5 % (ref 36.0–46.0)
HEMOGLOBIN: 13.2 g/dL (ref 12.0–15.0)
Lymphocytes Relative: 27 %
Lymphs Abs: 2.9 10*3/uL (ref 0.7–4.0)
MCH: 28.7 pg (ref 26.0–34.0)
MCHC: 32.6 g/dL (ref 30.0–36.0)
MCV: 88 fL (ref 78.0–100.0)
MONOS PCT: 8 %
Monocytes Absolute: 0.9 10*3/uL (ref 0.1–1.0)
NEUTROS ABS: 6.7 10*3/uL (ref 1.7–7.7)
NEUTROS PCT: 63 %
Platelets: 373 10*3/uL (ref 150–400)
RBC: 4.6 MIL/uL (ref 3.87–5.11)
RDW: 13.6 % (ref 11.5–15.5)
WBC: 10.6 10*3/uL — ABNORMAL HIGH (ref 4.0–10.5)

## 2016-08-17 NOTE — Progress Notes (Signed)
Pt was given discharge instructions today and all questions were answered. Pt is in her room waiting for her transportation home to arrive. [place]

## 2016-08-17 NOTE — Progress Notes (Signed)
Patient alert and oriented, pain is controlled. Patient is tolerating fluids, advanced to protein shake today, patient is tolerating well.  Reviewed Gastric sleeve discharge instructions with patient and patient is able to articulate understanding.  Provided information on BELT program, Support Group and [facility] outpatient pharmacy. All questions answered, will continue to monitor.  

## 2016-08-17 NOTE — Discharge Summary (Signed)
Physician Discharge Summary  Patient ID: [name] [name] MRN: [id number] DOB/AGE: 1971-09-07 45 y.o.  Admit date: 08/15/2016 Discharge date: 08/17/2016  Admission Diagnoses:  Morbid obesity with GERD  Discharge Diagnoses:  Same post sleeve gastrectomy with hiatal hernia repair  Principal Problem:   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 2017   Surgery:  Lap sleeve gastrectomy with hiatal hernia repair  Discharged Condition: improved   Hospital Course:   Had surgery.  Begun on clears and advanced to shakes.  Tolerated and ready for discharge  Consults: none  Significant Diagnostic Studies: none    Discharge Exam: Blood pressure 130/86, pulse 83, temperature 98.4 F (36.9 C), temperature source Oral, resp. rate 17, height 4\' 9"  (1.448 m), weight 96 kg (211 lb 9.6 oz), last menstrual period 11/17/2015, SpO2 97 %. incicisions OK  Disposition: 01-Home or Self Care  Discharge Instructions    Ambulate hourly while awake    Complete by:  As directed    Call MD for:  difficulty breathing, headache or visual disturbances    Complete by:  As directed    Call MD for:  persistant dizziness or light-headedness    Complete by:  As directed    Call MD for:  persistant nausea and vomiting    Complete by:  As directed    Call MD for:  redness, tenderness, or signs of infection (pain, swelling, redness, odor or green/yellow discharge around incision site)    Complete by:  As directed    Call MD for:  severe uncontrolled pain    Complete by:  As directed    Call MD for:  temperature >101 F    Complete by:  As directed    Diet bariatric full liquid    Complete by:  As directed    Incentive spirometry    Complete by:  As directed    Perform hourly while awake       Medication List    TAKE these medications   acetaminophen 500 MG tablet Commonly known as:  TYLENOL Take 1,000 mg by mouth every 6 (six) hours as needed for moderate pain or  headache.   ALPRAZolam 1 MG tablet Commonly known as:  XANAX Take 1 tablet (1 mg total) by mouth at bedtime as needed. for sleep   amLODipine 2.5 MG tablet Commonly known as:  NORVASC TAKE 1 TABLET BY MOUTH EVERY DAY Notes to patient:  Monitor Blood Pressure Daily and keep a log for primary care physician.  You may need to make changes to your medications with rapid weight loss.     BIOTIN PO Take 5,000 mg by mouth daily.   citalopram 20 MG tablet Commonly known as:  CELEXA Take 1 tablet (20 mg total) by mouth daily.   fluticasone 50 MCG/ACT nasal spray Commonly known as:  FLONASE SHAKE LIQUID AND USE 2 SPRAYS IN EACH NOSTRIL DAILY AS NEEDED   gabapentin 100 MG capsule Commonly known as:  NEURONTIN Take 1 capsule (100 mg total) by mouth at bedtime.   loratadine 10 MG tablet Commonly known as:  CLARITIN TAKE 1 TABLET(10 MG) BY MOUTH DAILY   nystatin powder Generic drug:  nystatin APPLY TOPICALLY TWICE DAILY   omeprazole 20 MG tablet Commonly known as:  PRILOSEC OTC Take 1 tablet (20 mg total) by mouth daily.   polyethylene glycol packet Commonly known as:  MIRALAX / GLYCOLAX Take 17 g by mouth daily as needed for mild constipation or moderate constipation.  RESTASIS 0.05 % ophthalmic emulsion Generic drug:  cycloSPORINE Place 1 drop into both eyes 2 (two) times daily.   STOOL SOFTENER PO Take 2 tablets by mouth daily as needed (constipation).   temazepam 15 MG capsule Commonly known as:  RESTORIL TAKE 1 CAPSULE BY MOUTH EVERY NIGHT AT BEDTIME AS NEEDED FOR SLEEP   traMADol 50 MG tablet Commonly known as:  ULTRAM Take 1 tablet (50 mg total) by mouth daily as needed. What changed:  when to take this  reasons to take this   Vitamin D (Ergocalciferol) 50000 units Caps capsule Commonly known as:  DRISDOL Take 1 capsule (50,000 Units total) by mouth every 7 (seven) days. What changed:  additional instructions      Follow-up Information    Valarie Merino, MD. Go on 09/02/2016.   Specialty:  General Surgery Why:  at 9:45 AM for post-op check Contact information: [address] [place] [address] [phone]        Valarie Merino, MD Follow up.   Specialty:  General Surgery Contact information: [address] [place] [address] [phone]           Signed: Valarie Merino 08/17/2016, 2:24 PM

## 2016-08-30 ENCOUNTER — Encounter: Payer: BC Managed Care – PPO | Attending: Surgery | Admitting: Dietician

## 2016-08-30 DIAGNOSIS — Z713 Dietary counseling and surveillance: Secondary | ICD-10-CM | POA: Diagnosis present

## 2016-08-31 NOTE — Progress Notes (Signed)
Bariatric Class:  Appt start time: 1530 end time:  1630.  2 Week Post-Operative Nutrition Class  Patient was seen on 08/30/2016 for Post-Operative Nutrition education at [facility].   Surgery date: 08/15/2016 Surgery type: sleeve gastrectomy Start weight at [facility]: 216 lbs on 05/04/2016, 222.2 lbs on 08/01/16 Weight today: 201.2 lbs  Weight change: 21 lbs  TANITA  BODY COMP RESULTS  08/01/16 08/30/16   BMI (kg/m^2) 48.1 43.5   Fat Mass (lbs) 114 100.6   Fat Free Mass (lbs) 108.2 100.6   Total Body Water (lbs) 78.6 72.4   The following the learning objectives were met by the patient during this course:  Identifies Phase 3A (Soft, High Proteins) Dietary Goals and will begin from 2 weeks post-operatively to 2 months post-operatively  Identifies appropriate sources of fluids and proteins   States protein recommendations and appropriate sources post-operatively  Identifies the need for appropriate texture modifications, mastication, and bite sizes when consuming solids  Identifies appropriate multivitamin and calcium sources post-operatively  Describes the need for physical activity post-operatively and will follow MD recommendations  States when to call healthcare provider regarding medication questions or post-operative complications  Handouts given during class include:  Phase 3A: Soft, High Protein Diet Handout  Follow-Up Plan: Patient will follow-up at [facility] in 6 weeks for 2 month post-op nutrition visit for diet advancement per MD.

## 2016-09-05 ENCOUNTER — Encounter: Payer: Self-pay | Admitting: Family Medicine

## 2016-09-05 ENCOUNTER — Ambulatory Visit (INDEPENDENT_AMBULATORY_CARE_PROVIDER_SITE_OTHER): Payer: BC Managed Care – PPO | Admitting: Family Medicine

## 2016-09-05 VITALS — BP 110/70 | HR 92 | Temp 98.7°F | Resp 18 | Ht <= 58 in | Wt 198.0 lb

## 2016-09-05 DIAGNOSIS — I10 Essential (primary) hypertension: Secondary | ICD-10-CM

## 2016-09-05 DIAGNOSIS — F411 Generalized anxiety disorder: Secondary | ICD-10-CM | POA: Diagnosis not present

## 2016-09-05 DIAGNOSIS — K219 Gastro-esophageal reflux disease without esophagitis: Secondary | ICD-10-CM

## 2016-09-05 MED ORDER — CLOTRIMAZOLE-BETAMETHASONE 1-0.05 % EX CREA: TOPICAL_CREAM | CUTANEOUS | 0 refills | Status: DC

## 2016-09-05 NOTE — Patient Instructions (Signed)
Nurse bP check in 5 weeks, call if you need me before  Keep MD appt as before  Antifungal cream prescribed , as we discussed  CONGRATS on new healthy lifestyle as far as diet is concerned, please commit to regular exercise  Thank you  for choosing [facility]. We consider it a privelige to serve you.  Delivering excellent health care in a caring and  compassionate way is our goal.  Partnering with you,  so that together we can achieve this goal is our strategy.

## 2016-09-12 ENCOUNTER — Encounter: Payer: Self-pay | Admitting: Family Medicine

## 2016-09-12 NOTE — Assessment & Plan Note (Signed)
Improved Patient re-educated about  the importance of commitment to a  minimum of 150 minutes of exercise per week.  The importance of healthy food choices with portion control discussed. Encouraged to start a food diary, count calories and to consider  joining a support group. Sample diet sheets offered. Goals set by the patient for the next several months.   Weight /BMI 09/05/2016 08/30/2016 08/17/2016  WEIGHT 198 lb 201 lb 3.2 oz 211 lb 9.6 oz  HEIGHT 4\' 9"  4\' 9"  -  BMI 42.85 kg/m2 43.54 kg/m2 -

## 2016-09-12 NOTE — Assessment & Plan Note (Signed)
No med change at this time, will likely need decrease in meds by nurse f/u in 5 to 6 weeks DASH diet and commitment to daily physical activity for a minimum of 30 minutes discussed and encouraged, as a part of hypertension management. The importance of attaining a healthy weight is also discussed.  BP/Weight 09/05/2016 08/30/2016 08/17/2016 08/15/2016 08/08/2016 08/01/2016 123XX123  Systolic BP A999333 - [id number] - A999333 - 0000000  Diastolic BP 70 - 95 - 89 - 82  Wt. (Lbs) 198 201.2 211.6 - 212 222.2 223  BMI 42.85 43.54 - 45.79 45.88 48.08 48.26

## 2016-09-12 NOTE — Assessment & Plan Note (Signed)
Controlled, no change in medication  

## 2016-09-12 NOTE — Progress Notes (Signed)
   JENETTE DONALSON     MRN: [id number]      DOB: [birth year]   HPI Ms. Buehrle is here for follow up and re-evaluation of chronic medical conditions, medication management and review of any available recent lab and radiology data.  Preventive health is updated, specifically  Cancer screening and Immunization.   Questions or concerns regarding consultations or procedures which the PT has had in the interim are  Addressed.Successful gastrectomy last month with excellent tolerance and weight ;loss Has concerns re BP and need for medication, not light headed The PT denies any adverse reactions to current medications since the last visit.  There are no new concerns.  There are no specific complaints   ROS Denies recent fever or chills. Denies sinus pressure, nasal congestion, ear pain or sore throat. Denies chest congestion, productive cough or wheezing. Denies chest pains, palpitations and leg swelling Denies abdominal pain, nausea, vomiting,diarrhea or constipation.   Denies dysuria, frequency, hesitancy or incontinence. Denies joint pain, swelling and limitation in mobility. Denies headaches, seizures, numbness, or tingling. Denies depression, anxiety or insomnia. Denies skin break down or rash.   PE  BP 110/70 (BP Location: Right Arm, Patient Position: Sitting, Cuff Size: Large)   Pulse 92   Temp 98.7 F (37.1 C) (Oral)   Resp 18   Ht 4\' 9"  (1.448 m)   Wt 198 lb (89.8 kg)   LMP 11/17/2015   SpO2 98%   BMI 42.85 kg/m   Patient alert and oriented and in no cardiopulmonary distress.  HEENT: No facial asymmetry, EOMI,   oropharynx pink and moist.  Neck supple no JVD, no mass.  Chest: Clear to auscultation bilaterally.  CVS: S1, S2 no murmurs, no S3.Regular rate.  ABD: Soft non tender.   Ext: No edema  MS: Adequate ROM spine, shoulders, hips and knees.  Skin: Intact, no ulcerations or rash noted.  Psych: Good eye contact, normal affect. Memory intact not anxious or depressed  appearing.  CNS: CN 2-12 intact, power,  normal throughout.no focal deficits noted.   Assessment & Plan  Essential hypertension No med change at this time, will likely need decrease in meds by nurse f/u in 5 to 6 weeks DASH diet and commitment to daily physical activity for a minimum of 30 minutes discussed and encouraged, as a part of hypertension management. The importance of attaining a healthy weight is also discussed.  BP/Weight 09/05/2016 08/30/2016 08/17/2016 08/15/2016 08/08/2016 08/01/2016 123XX123  Systolic BP A999333 - Q000111Q - A999333 - 0000000  Diastolic BP 70 - 95 - 89 - 82  Wt. (Lbs) 198 201.2 211.6 - 212 222.2 223  BMI 42.85 43.54 - 45.79 45.88 48.08 48.26       Morbid obesity (HCC) Improved Patient re-educated about  the importance of commitment to a  minimum of 150 minutes of exercise per week.  The importance of healthy food choices with portion control discussed. Encouraged to start a food diary, count calories and to consider  joining a support group. Sample diet sheets offered. Goals set by the patient for the next several months.   Weight /BMI 09/05/2016 08/30/2016 08/17/2016  WEIGHT 198 lb 201 lb 3.2 oz 211 lb 9.6 oz  HEIGHT 4\' 9"  4\' 9"  -  BMI 42.85 kg/m2 43.54 kg/m2 -      GERD Controlled, no change in medication   ANXIETY DISORDER, GENERALIZED Controlled, no change in medication

## 2016-09-14 ENCOUNTER — Encounter: Payer: Self-pay | Admitting: Dietician

## 2016-09-14 ENCOUNTER — Encounter: Payer: BC Managed Care – PPO | Admitting: Dietician

## 2016-09-14 DIAGNOSIS — Z713 Dietary counseling and surveillance: Secondary | ICD-10-CM | POA: Diagnosis not present

## 2016-09-14 NOTE — Patient Instructions (Addendum)
Goals:  Follow Phase 3B: High Protein + Non-Starchy Vegetables  Eat 3-6 small meals/snacks, every 3-5 hrs  Increase lean protein foods to meet 60g goal  Increase fluid intake to 64oz +  Avoid drinking 15 minutes before, during and 30 minutes after eating  Aim for >30 min of physical activity daily  Surgery date: 08/15/2016 Surgery type: sleeve gastrectomy Start weight at [facility]: 216 lbs on 05/04/2016, 222.2 lbs on 08/01/16 Weight today: 197.8 lbs Weight change: 3.4 lbs Total weight lost: 25 lbs   Goal weight: 120-160 lbs   TANITA  BODY COMP RESULTS  08/01/16 08/30/16 09/14/16   BMI (kg/m^2) 48.1 43.5 42.8   Fat Mass (lbs) 114 100.6 98.2   Fat Free Mass (lbs) 108.2 100.6 101.6   Total Body Water (lbs) 78.6 72.4 73.2

## 2016-09-14 NOTE — Progress Notes (Signed)
  Follow-up visit:  4 Weeks Post-Operative sleeve gastrectomy Surgery  Medical Nutrition Therapy:  Appt start time: 310 end time:  350  Primary concerns today: Post-operative Bariatric Surgery Nutrition Management. [name] [name] returns for a bariatric nutrition follow up having lost a total of 25 lbs. She reports that she is meeting protein needs and usually meeting fluid needs. Reports that she is bored with her limited food choices. Tried turnip greens and found that they "settled better than green beans." Feeling ready to add some veggies. No vomiting or diarrhea. Struggling with constipation.   Surgery date: 08/15/2016 Surgery type: sleeve gastrectomy Start weight at [facility]: 216 lbs on 05/04/2016, 222.2 lbs on 08/01/16 Weight today: 197.8 lbs Weight change: 3.4 lbs Total weight lost: 25 lbs   Goal weight: 120-160 lbs   TANITA  BODY COMP RESULTS  08/01/16 08/30/16 09/14/16   BMI (kg/m^2) 48.1 43.5 42.8   Fat Mass (lbs) 114 100.6 98.2   Fat Free Mass (lbs) 108.2 100.6 101.6   Total Body Water (lbs) 78.6 72.4 73.2    Preferred Learning Style:   No preference indicated   Learning Readiness:   Ready  24-hr recall: B (8AM): Premier protein shake throughout the day (20-30g) Snk (AM):  L (1 PM): [place] and cheese or chicken or [place] sausage (10-12g) Snk (PM):   D (6PM): [place] or chicken Snk (PM):   Fluid intake: 11-16 oz protein shake, 32 oz water  Estimated total protein intake: at least 60 per patient  Medications: see list Supplementation: taking  Using straws: did not assess Drinking while eating: no Hair loss: no Carbonated beverages: no N/V/D/C: constipation Dumping syndrome: no  Recent physical activity:  Plans to start BELT in January  Progress Towards Goal(s):  In progress.  Handouts given during visit include:  Phase 3B lean protein + non starchy vegetables   Nutritional Diagnosis:  [place]-3.3 Overweight/obesity related to past poor dietary habits and physical  inactivity as evidenced by patient w/ recent sleeve gastrectomy surgery following dietary guidelines for continued weight loss.     Intervention:  Nutrition education provided. Advised the patient to wait until 8 weeks post op to transition into phase 3B (non starchy vegetables). Encouraged primarily protein foods and soft, well-cooked non starchy vegetables as tolerated.   Teaching Method Utilized:  Visual Auditory Hands on  Barriers to learning/adherence to lifestyle change: none  Demonstrated degree of understanding via:  Teach Back   Monitoring/Evaluation:  Dietary intake, exercise, and body weight. Follow up in 1 months for 2 month post-op visit.

## 2016-10-01 ENCOUNTER — Other Ambulatory Visit: Payer: Self-pay | Admitting: Family Medicine

## 2016-10-12 ENCOUNTER — Ambulatory Visit: Payer: BC Managed Care – PPO

## 2016-10-12 ENCOUNTER — Ambulatory Visit: Payer: BC Managed Care – PPO | Admitting: Dietician

## 2016-10-12 VITALS — BP 120/76

## 2016-10-12 DIAGNOSIS — I10 Essential (primary) hypertension: Secondary | ICD-10-CM

## 2016-10-12 NOTE — Patient Instructions (Signed)
Your blood pressure is normal.  If you haven't had your blood pressure med in the last 3 days then you should be fine without it.    If you have any signs of your blood pressure being elevated like dizziness or headache.  Please call the office.   Keep your next scheduled appointment.

## 2016-10-12 NOTE — Progress Notes (Signed)
Patient in for nurse blood pressure check.  States that she has not had Amlodipine in the last couple of days.     Verbal received to instruct patient to keep next appointment and call with any problems.

## 2016-10-24 ENCOUNTER — Encounter: Payer: BC Managed Care – PPO | Attending: Surgery | Admitting: Dietician

## 2016-10-24 DIAGNOSIS — Z713 Dietary counseling and surveillance: Secondary | ICD-10-CM | POA: Insufficient documentation

## 2016-10-24 NOTE — Progress Notes (Signed)
  Follow-up visit:  2.5 months Post-Operative sleeve gastrectomy Surgery  Medical Nutrition Therapy:  Appt start time: 505 end time:  550  Primary concerns today: Post-operative Bariatric Surgery Nutrition Management. [name] [name] returns for a bariatric nutrition follow up having lost another 10 lbs fat. She recently returned from a trip to [place] where she got off track. States that she tried to avoid starchy foods but had some Coke and desserts. Has since started weaning off caffeine and sodas. Patient agrees that she would benefit from counseling.  Surgery date: 08/15/2016 Surgery type: sleeve gastrectomy Start weight at [facility]: 216 lbs on 05/04/2016, 222.2 lbs on 08/01/16 Weight today: 192.2 Weight change:  5 lbs Total weight lost: 31 lbs   Goal weight: 120-160 lbs   TANITA  BODY COMP RESULTS  08/01/16 08/30/16 09/14/16 10/24/16   BMI (kg/m^2) 48.1 43.5 42.8 41.6   Fat Mass (lbs) 114 100.6 98.2 88.4   Fat Free Mass (lbs) 108.2 100.6 101.6 103.8   Total Body Water (lbs) 78.6 72.4 73.2 74.4    Preferred Learning Style:   No preference indicated   Learning Readiness:   Ready  24-hr recall: B (8AM): Premier protein shake (30g) Snk (AM):  L (11 AM - 1 PM): 3 strips bacon and 1 egg with cheese (12g) Snk (0000000 PM): 3 slices deli [place] ([id number]) D (6PM): meatloaf and greens (14-21g) Snk (PM):   Fluid intake: did not assess Estimated total protein intake: at least 60 per patient  Medications: see list Supplementation: taking  Drinking while eating: no Hair loss: no Carbonated beverages: yes N/V/D/C: nausea; constipation Dumping syndrome: no  Recent physical activity:  Plans to start BELT in March  Progress Towards Goal(s):  In progress.  Handouts given during visit include:  none   Nutritional Diagnosis:  [place]-3.3 Overweight/obesity related to past poor dietary habits and physical inactivity as evidenced by patient w/ recent sleeve gastrectomy surgery following dietary  guidelines for continued weight loss.     Intervention:  Nutrition education provided.   Teaching Method Utilized:  Visual Auditory Hands on  Barriers to learning/adherence to lifestyle change: none  Demonstrated degree of understanding via:  Teach Back   Monitoring/Evaluation:  Dietary intake, exercise, and body weight. Follow up prn.

## 2016-10-24 NOTE — Patient Instructions (Addendum)
Goals:  Follow Phase 3B: High Protein + Non-Starchy Vegetables  Eat 3-6 small meals/snacks, every 3-5 hrs  Increase lean protein foods to meet 60g goal  Increase fluid intake to 64oz +  Avoid drinking 15 minutes before, during and 30 minutes after eating  Aim for >30 min of physical activity daily  Find a counselor to discuss coping mechanisms  Continue to plan ahead!  Keep some snacks on hand: P3 packs, deli meat, cheese, protein bar, boiled egg, small amount of nuts   Continue to wean off sodas and never go back!  Try [employer] or Oh Yeah One bars for something sweet   Surgery date: 08/15/2016 Surgery type: sleeve gastrectomy Start weight at [facility]: 216 lbs on 05/04/2016, 222.2 lbs on 08/01/16 Weight today: 192.2 Weight change:  lbs Total weight lost: 31 lbs   Goal weight: 120-160 lbs   TANITA  BODY COMP RESULTS  08/01/16 08/30/16 09/14/16 10/24/16   BMI (kg/m^2) 48.1 43.5 42.8 41.6   Fat Mass (lbs) 114 100.6 98.2 88.4   Fat Free Mass (lbs) 108.2 100.6 101.6 103.8   Total Body Water (lbs) 78.6 72.4 73.2 74.4

## 2016-10-25 ENCOUNTER — Encounter: Payer: Self-pay | Admitting: Dietician

## 2016-11-20 ENCOUNTER — Other Ambulatory Visit: Payer: Self-pay | Admitting: Family Medicine

## 2016-11-23 ENCOUNTER — Ambulatory Visit (INDEPENDENT_AMBULATORY_CARE_PROVIDER_SITE_OTHER): Payer: BC Managed Care – PPO | Admitting: Family Medicine

## 2016-11-23 ENCOUNTER — Encounter: Payer: Self-pay | Admitting: Family Medicine

## 2016-11-23 ENCOUNTER — Other Ambulatory Visit: Payer: Self-pay | Admitting: Family Medicine

## 2016-11-23 VITALS — BP 130/82 | HR 88 | Resp 16 | Ht <= 58 in | Wt 187.0 lb

## 2016-11-23 DIAGNOSIS — F411 Generalized anxiety disorder: Secondary | ICD-10-CM

## 2016-11-23 DIAGNOSIS — F5101 Primary insomnia: Secondary | ICD-10-CM

## 2016-11-23 DIAGNOSIS — I10 Essential (primary) hypertension: Secondary | ICD-10-CM | POA: Diagnosis not present

## 2016-11-23 DIAGNOSIS — R946 Abnormal results of thyroid function studies: Secondary | ICD-10-CM | POA: Diagnosis not present

## 2016-11-23 DIAGNOSIS — L659 Nonscarring hair loss, unspecified: Secondary | ICD-10-CM

## 2016-11-23 DIAGNOSIS — R5382 Chronic fatigue, unspecified: Secondary | ICD-10-CM

## 2016-11-23 DIAGNOSIS — R7989 Other specified abnormal findings of blood chemistry: Secondary | ICD-10-CM

## 2016-11-23 DIAGNOSIS — E559 Vitamin D deficiency, unspecified: Secondary | ICD-10-CM

## 2016-11-23 LAB — CBC
HEMATOCRIT: 38.2 % (ref 35.0–45.0)
Hemoglobin: 12.4 g/dL (ref 11.7–15.5)
MCH: 29.2 pg (ref 27.0–33.0)
MCHC: 32.5 g/dL (ref 32.0–36.0)
MCV: 89.9 fL (ref 80.0–100.0)
MPV: 9.9 fL (ref 7.5–12.5)
Platelets: 373 10*3/uL (ref 140–400)
RBC: 4.25 MIL/uL (ref 3.80–5.10)
RDW: 14.5 % (ref 11.0–15.0)
WBC: 7.5 10*3/uL (ref 3.8–10.8)

## 2016-11-23 MED ORDER — BUSPIRONE HCL 5 MG PO TABS: 5.0000 mg | ORAL_TABLET | Freq: Two times a day (BID) | ORAL | 3 refills | Status: DC

## 2016-11-23 NOTE — Patient Instructions (Signed)
F/u in 3 month, call if you need  Me before  Labs today CBC, iron, ferritin, B12, folate, vit D and TSH    New for anxiety is buspar 5 mg one twice daily, OK to break in half if scored, and start half tab twice dialy then after 1 week increase to the whole tab twice daily  Break xanax in half when you use this, plan is to wean oFF and to rely on behavioral changes to unwind TV needs to b set to go OFF after you are asleep for entire night  It is important that you exercise regularly at least 30 minutes 5 times a week. If you develop chest pain, have severe difficulty breathing, or feel very tired, stop exercising immediately and seek medical attention

## 2016-11-24 ENCOUNTER — Encounter: Payer: Self-pay | Admitting: Family Medicine

## 2016-11-24 ENCOUNTER — Other Ambulatory Visit: Payer: Self-pay | Admitting: Family Medicine

## 2016-11-24 DIAGNOSIS — Z1231 Encounter for screening mammogram for malignant neoplasm of breast: Secondary | ICD-10-CM

## 2016-11-24 DIAGNOSIS — R5383 Other fatigue: Secondary | ICD-10-CM | POA: Insufficient documentation

## 2016-11-24 DIAGNOSIS — R7989 Other specified abnormal findings of blood chemistry: Secondary | ICD-10-CM | POA: Insufficient documentation

## 2016-11-24 LAB — TSH: TSH: 0.32 mIU/L — ABNORMAL LOW

## 2016-11-24 LAB — T4, FREE: Free T4: 1.1 ng/dL (ref 0.8–1.8)

## 2016-11-24 LAB — FOLATE: FOLATE: 18.1 ng/mL (ref >5.4)

## 2016-11-24 LAB — T3, FREE: T3 FREE: 2.9 pg/mL (ref 2.3–4.2)

## 2016-11-24 LAB — IRON: Iron: 51 ug/dL (ref 40–190)

## 2016-11-24 LAB — VITAMIN D 25 HYDROXY (VIT D DEFICIENCY, FRACTURES): Vit D, 25-Hydroxy: 73 ng/mL (ref 30–100)

## 2016-11-24 LAB — FERRITIN: Ferritin: 45 ng/mL (ref 10–232)

## 2016-11-24 NOTE — Assessment & Plan Note (Signed)
Behavioral techniques encouraged to reduce anxiety and stress, plan is to wean oiff of xanax, start new low dose buspar twice daily and commit to daily exercise

## 2016-11-24 NOTE — Assessment & Plan Note (Addendum)
Improved Patient re-educated about  the importance of commitment to a  minimum of 150 minutes of exercise per week.  The importance of healthy food choices with portion control discussed. Encouraged to start a food diary, count calories and to consider  joining a support group. Sample diet sheets offered. Goals set by the patient for the next several months.   Weight /BMI 11/23/2016 10/24/2016 09/14/2016  WEIGHT 187 lb 0.6 oz 192 lb 3.2 oz 197 lb 12.8 oz  HEIGHT 4\' 9"  4\' 9"  4\' 9"   BMI 40.48 kg/m2 41.59 kg/m2 42.8 kg/m2

## 2016-11-24 NOTE — Assessment & Plan Note (Signed)
Continue weekly supplement 

## 2016-11-24 NOTE — Assessment & Plan Note (Signed)
Sleep hygiene reviewed and written information offered also. Prescription sent for  medication needed. Needs to turn tV off during slepep

## 2016-11-24 NOTE — Assessment & Plan Note (Signed)
Fatigue and hair loss and irritability noted following recent gastrectomy. Add free T3 and free T4 , and get US thyroid and refer to endo

## 2016-11-24 NOTE — Assessment & Plan Note (Signed)
Controlled, no change in medication DASH diet and commitment to daily physical activity for a minimum of 30 minutes discussed and encouraged, as a part of hypertension management. The importance of attaining a healthy weight is also discussed.  BP/Weight 11/23/2016 10/24/2016 10/12/2016 09/14/2016 09/05/2016 08/30/2016 01/60/1093  Systolic BP 235 - 573 - 220 - 254  Diastolic BP 82 - 76 - 70 - 95  Wt. (Lbs) 187.04 192.2 - 197.8 198 201.2 211.6  BMI 40.48 41.59 - 42.8 42.85 43.54 -

## 2016-11-24 NOTE — Assessment & Plan Note (Signed)
C/o chronic fatigue following gastrectomy, needs iron and vitamin levels checked, also needs to improve sleep hygiene

## 2016-11-24 NOTE — Progress Notes (Signed)
[name] [name]     MRN: [id number]      DOB: 05-08-1971   HPI [name] [name] is here for follow up and re-evaluation of chronic medical conditions, medication management and review of any available recent lab and radiology data.  Preventive health is updated, specifically  Cancer screening and Immunization.   She has had successful sleeve gastrectomy, however feels overwhelmed, has to make herself eat, c/o hair loss and fatigue. Sleep is poor and she sleeps with the TV on The PT denies any adverse reactions to current medications since the last visit.     ROS Denies recent fever or chills. Denies sinus pressure, nasal congestion, ear pain or sore throat. Denies chest congestion, productive cough or wheezing. Denies chest pains, palpitations and leg swelling Denies abdominal pain, nausea, vomiting,diarrhea or constipation.   Denies dysuria, frequency, hesitancy or incontinence. Denies joint pain, swelling and limitation in mobility. Denies headaches, seizures, numbness, or tingling. Denies depression, anxiety or in Denies skin break down or rash.   PE  BP 130/82 (BP Location: Left Arm, Patient Position: Sitting, Cuff Size: Normal)   Pulse 88   Resp 16   Ht 4\' 9"  (1.448 m)   Wt 187 lb 0.6 oz (84.8 kg)   LMP 11/17/2015   SpO2 98%   BMI 40.48 kg/m   Patient alert and oriented and in no cardiopulmonary distress.  HEENT: No facial asymmetry, EOMI,   oropharynx pink and moist.  Neck supple no JVD, no mass.  Chest: Clear to auscultation bilaterally.  CVS: S1, S2 no murmurs, no S3.Regular rate.  ABD: Soft non tender.   Ext: No edema  MS: Adequate ROM spine, shoulders, hips and knees.  Skin: Intact, no ulcerations or rash noted.  Psych: Good eye contact, normal affect. Memory intact not anxious or depressed appearing.  CNS: CN 2-12 intact, power,  normal throughout.no focal deficits noted.   Assessment & Plan  Fatigue C/o chronic fatigue following gastrectomy, needs iron  and vitamin levels checked, also needs to improve sleep hygiene  Insomnia Sleep hygiene reviewed and written information offered also. Prescription sent for  medication needed. Needs to turn tV off during slepep  ANXIETY DISORDER, GENERALIZED Behavioral techniques encouraged to reduce anxiety and stress, plan is to wean oiff of xanax, start new low dose buspar twice daily and commit to daily exercise  Morbid obesity ([place]) Improved Patient re-educated about  the importance of commitment to a  minimum of 150 minutes of exercise per week.  The importance of healthy food choices with portion control discussed. Encouraged to start a food diary, count calories and to consider  joining a support group. Sample diet sheets offered. Goals set by the patient for the next several months.   Weight /BMI 11/23/2016 10/24/2016 09/14/2016  WEIGHT 187 lb 0.6 oz 192 lb 3.2 oz 197 lb 12.8 oz  HEIGHT 4\' 9"  4\' 9"  4\' 9"   BMI 40.48 kg/m2 41.59 kg/m2 42.8 kg/m2      Essential hypertension Controlled, no change in medication DASH diet and commitment to daily physical activity for a minimum of 30 minutes discussed and encouraged, as a part of hypertension management. The importance of attaining a healthy weight is also discussed.  BP/Weight 11/23/2016 10/24/2016 10/12/2016 09/14/2016 09/05/2016 08/30/2016 94/85/4627  Systolic BP 035 - 009 - 381 - 829  Diastolic BP 82 - 76 - 70 - 95  Wt. (Lbs) 187.04 192.2 - 197.8 198 201.2 211.6  BMI 40.48 41.59 - 42.8 42.85 43.54 -  Vitamin D deficiency Continue weekly supplement  Abnormal TSH Fatigue and hair loss and irritability noted following recent gastrectomy. Add free T3 and free T4 , and get US thyroid and refer to endo

## 2016-11-25 ENCOUNTER — Other Ambulatory Visit: Payer: Self-pay

## 2016-11-25 MED ORDER — VITAMIN D (ERGOCALCIFEROL) 1.25 MG (50000 UNIT) PO CAPS: 50000.0000 [IU] | ORAL_CAPSULE | ORAL | 4 refills | Status: DC

## 2016-11-29 ENCOUNTER — Ambulatory Visit (HOSPITAL_COMMUNITY)
Admission: RE | Admit: 2016-11-29 | Discharge: 2016-11-29 | Disposition: A | Discharge status: Home / Self Care | Payer: BC Managed Care – PPO | Source: Ambulatory Visit | Attending: Family Medicine | Admitting: Family Medicine

## 2016-11-29 DIAGNOSIS — L659 Nonscarring hair loss, unspecified: Secondary | ICD-10-CM | POA: Insufficient documentation

## 2016-11-29 DIAGNOSIS — R946 Abnormal results of thyroid function studies: Secondary | ICD-10-CM | POA: Insufficient documentation

## 2016-11-29 DIAGNOSIS — R7989 Other specified abnormal findings of blood chemistry: Secondary | ICD-10-CM

## 2016-11-29 DIAGNOSIS — E042 Nontoxic multinodular goiter: Secondary | ICD-10-CM | POA: Insufficient documentation

## 2016-11-30 ENCOUNTER — Encounter: Payer: Self-pay | Admitting: Family Medicine

## 2016-11-30 NOTE — Telephone Encounter (Signed)
Vit d level ck'd on 3 7 18  = 73  Do you want to reorder vit d?

## 2016-12-14 ENCOUNTER — Ambulatory Visit: Payer: BC Managed Care – PPO

## 2016-12-30 ENCOUNTER — Encounter: Payer: Self-pay | Admitting: "Endocrinology

## 2016-12-30 ENCOUNTER — Ambulatory Visit (INDEPENDENT_AMBULATORY_CARE_PROVIDER_SITE_OTHER): Payer: BC Managed Care – PPO | Admitting: "Endocrinology

## 2016-12-30 VITALS — BP 115/76 | HR 80 | Ht <= 58 in | Wt 183.0 lb

## 2016-12-30 DIAGNOSIS — E0590 Thyrotoxicosis, unspecified without thyrotoxic crisis or storm: Secondary | ICD-10-CM | POA: Diagnosis not present

## 2016-12-30 DIAGNOSIS — E042 Nontoxic multinodular goiter: Secondary | ICD-10-CM | POA: Diagnosis not present

## 2016-12-30 NOTE — Progress Notes (Signed)
Subjective:    Patient ID: [name] [name], female    DOB: October 16, 1970, PCP Tula Nakayama, MD   Past Medical History:  Diagnosis Date  . Anxiety   . Bursitis   . Bursitis   . GERD (gastroesophageal reflux disease)   . Hypertension    seen by [facility]  . Palpitations   . PONV (postoperative nausea and vomiting)   . Venous insufficiency    Past Surgical History:  Procedure Laterality Date  . ABDOMINAL HYSTERECTOMY  01/16/2016 [facility])   partial for fibroids  . CESAREAN SECTION    . DILATION AND CURETTAGE OF UTERUS    . HYSTEROSCOPY W/D&C  04/02/2012   Procedure: DILATATION AND CURETTAGE /HYSTEROSCOPY;  Surgeon: Marvene Staff, MD;  Location: [facility];  Service: Gynecology;  Laterality: N/A;  . LAPAROSCOPIC GASTRIC SLEEVE RESECTION WITH HIATAL HERNIA REPAIR N/A 08/15/2016   Procedure: LAPAROSCOPIC GASTRIC SLEEVE RESECTION WITH HIATAL HERNIA REPAIR WITH UPPER ENDO;  Surgeon: Johnathan Hausen, MD;  Location: [facility];  Service: General;  Laterality: N/A;  . LAPAROSCOPIC TUBAL LIGATION  04/02/2012   Procedure: LAPAROSCOPIC TUBAL LIGATION;  Surgeon: Marvene Staff, MD;  Location: [facility];  Service: Gynecology;  Laterality: Bilateral;  . TRANSTHORACIC ECHOCARDIOGRAM  01/11/2009   EF >55%, normal   Social History   Social History  . Marital status: Married    Spouse name: N/A  . Number of children: N/A  . Years of education: N/A   Social History Main Topics  . Smoking status: Never Smoker  . Smokeless tobacco: Never Used  . Alcohol use Yes     Comment: rarely  . Drug use: No  . Sexual activity: Yes   Other Topics Concern  . None   Social History Narrative  . None   Outpatient Encounter Prescriptions as of 12/30/2016  Medication Sig  . acetaminophen (TYLENOL) 500 MG tablet Take 1,000 mg by mouth every 6 (six) hours as needed for moderate pain or headache.  . ALPRAZolam (XANAX) 1 MG tablet TAKE 1 TABLET BY MOUTH EVERY NIGHT AT BEDTIME AS NEEDED  FOR SLEEP  . busPIRone (BUSPAR) 5 MG tablet Take 1 tablet (5 mg total) by mouth 2 (two) times daily.  . citalopram (CELEXA) 20 MG tablet Take 1 tablet (20 mg total) by mouth daily.  . clotrimazole-betamethasone (LOTRISONE) cream Apply twice daily to itchy rash for 1 week , then as needed, to affected area(s)  . fluticasone (FLONASE) 50 MCG/ACT nasal spray SHAKE LIQUID AND USE 2 SPRAYS IN EACH NOSTRIL DAILY AS NEEDED  . loratadine (CLARITIN) 10 MG tablet TAKE 1 TABLET(10 MG) BY MOUTH DAILY  . NYSTATIN powder APPLY TOPICALLY TWICE DAILY  . RESTASIS 0.05 % ophthalmic emulsion Place 1 drop into both eyes 2 (two) times daily.   . Vitamin D, Ergocalciferol, (DRISDOL) 50000 units CAPS capsule Take 1 capsule (50,000 Units total) by mouth every 7 (seven) days.  . [DISCONTINUED] BIOTIN PO Take 5,000 mg by mouth daily.  . [DISCONTINUED] Docusate Calcium (STOOL SOFTENER PO) Take 2 tablets by mouth daily as needed (constipation).  . [DISCONTINUED] polyethylene glycol (MIRALAX / GLYCOLAX) packet Take 17 g by mouth daily as needed for mild constipation or moderate constipation.  . [DISCONTINUED] temazepam (RESTORIL) 15 MG capsule TAKE 1 CAPSULE BY MOUTH EVERY NIGHT AT BEDTIME AS NEEDED FOR SLEEP  . [DISCONTINUED] traMADol (ULTRAM) 50 MG tablet Take 1 tablet (50 mg total) by mouth daily as needed. (Patient taking differently: Take 50 mg by mouth 2 (  two) times daily as needed for moderate pain. )   No facility-administered encounter medications on file as of 12/30/2016.    ALLERGIES: Allergies  Allergen Reactions  . Diltiazem Rash  . Phentermine Palpitations    H/o non sustained v tach    VACCINATION STATUS: Immunization History  Administered Date(s) Administered  . Influenza Split 07/05/2012  . Tdap 07/05/2011    HPI 46 year old female patient with medical history as above. She is being seen in consultation for subclinical hyperthyroidism and multinodular goiter requested by Dr. Tula Nakayama. -  Patient denies any prior history of thyroid dysfunction nor any requirement for antithyroid therapy. -She is status post sleeve gastrectomy for weight management in the past. -She underwent thyroid function tests on 11/23/2016 which showed slightly suppressed TSH of 0.32 (normal 0.4-4.5) along with normal free T3 and free T4. -She also underwent thyroid ultrasound on 11/29/2016 which showed a 1.1 cm nodule on the left lobe which is 5.5 cm overall the patient, right lobe 5.7 cm with no nodules. -Patient has multiple nonspecific symptoms including hair thinning, fatigue. - She has intentional progressive weight loss of 9 pounds over the last 2 months. - She denies palpitations, heat intolerance, tremors, diaphoresis. -She denies any family history of thyroid dysfunction. -She denies dysphagia, shortness of breath, voice change.  Review of Systems  Constitutional: + weight loss, + fatigue, no subjective hyperthermia, no subjective hypothermia Eyes: no blurry vision, no xerophthalmia ENT: no sore throat, no nodules palpated in throat, no dysphagia/odynophagia, no hoarseness Cardiovascular: no Chest Pain, no Shortness of Breath, no palpitations, no leg swelling Respiratory: no cough, no SOB Gastrointestinal: no Nausea/Vomiting/Diarhhea Musculoskeletal: no muscle/joint aches Skin: no rashes Neurological: no tremors, no numbness, no tingling, no dizziness Psychiatric: + depression, + anxiety  Objective:    BP 115/76   Pulse 80   Ht 4\' 9"  (1.448 m)   Wt 183 lb (83 kg)   LMP 11/17/2015   BMI 39.60 kg/m   Wt Readings from Last 3 Encounters:  12/30/16 183 lb (83 kg)  11/23/16 187 lb 0.6 oz (84.8 kg)  10/25/16 192 lb 3.2 oz (87.2 kg)    Physical Exam  Constitutional: Significantly over weight for hight, not in acute distress, normal state of mind Eyes: PERRLA, EOMI, no exophthalmos ENT: moist mucous membranes, + mild thyromegaly (  1.1 cm nodule on the left lobe), no cervical  lymphadenopathy Cardiovascular: normal precordial activity, Regular Rate and Rhythm, no Murmur/Rubs/Gallops Respiratory:  adequate breathing efforts, no gross chest deformity, Clear to auscultation bilaterally Gastrointestinal: abdomen soft, Non -tender, No distension, Bowel Sounds present Musculoskeletal: no gross deformities, strength intact in all four extremities Skin: moist, warm, no rashes, thinning of scalp hair. Neurological: no tremor with outstretched hands, Deep tendon reflexes normal in all four extremities.  CMP ( most recent) CMP     Component Value Date/Time   NA 137 08/08/2016 1450   K 3.9 08/08/2016 1450   CL 105 08/08/2016 1450   CO2 25 08/08/2016 1450   GLUCOSE 89 08/08/2016 1450   BUN 14 08/08/2016 1450   CREATININE 0.78 08/15/2016 1321   CREATININE 0.79 08/28/2014 1450   CALCIUM 9.2 08/08/2016 1450   PROT 7.9 08/08/2016 1450   ALBUMIN 3.9 08/08/2016 1450   AST 18 08/08/2016 1450   ALT 13 (L) 08/08/2016 1450   ALKPHOS 78 08/08/2016 1450   BILITOT 0.5 08/08/2016 1450   GFRNONAA >60 08/15/2016 1321   GFRAA >60 08/15/2016 1321     Diabetic Labs (most  recent): Lab Results  Component Value Date   HGBA1C 5.7 (H) 08/28/2014   HGBA1C 5.5 07/09/2013   HGBA1C 5.3 07/05/2012     Lipid Panel ( most recent) Lipid Panel     Component Value Date/Time   CHOL 162 08/28/2014 1450   TRIG 64 08/28/2014 1450   HDL 64 08/28/2014 1450   CHOLHDL 2.5 08/28/2014 1450   VLDL 13 08/28/2014 1450   LDLCALC 85 08/28/2014 1450    Results for FLORABELLE, CARDIN (MRN [id number]) as of 12/30/2016 12:18  Ref. Range 08/28/2014 14:50 11/23/2016 16:57  TSH Latest Units: mIU/L 0.323 (L) 0.32 (L)  Triiodothyronine,Free,Serum Latest Ref Range: 2.3 - 4.2 pg/mL 3.4 2.9  T4,Free(Direct) Latest Ref Range: 0.8 - 1.8 ng/dL 1.18 1.1   Assessment & Plan:   1. Subclinical hyperthyroidism 2. Multinodular goiter  - I have reviewed her available thyroid records and clinically evaluated this  patient. - Her most recent 6 of thyroid function tests are consistent with subclinical hyperthyroidism. - She will not require antithyroid intervention at this time. - I have advised her to repeat thyroid function tests in 6 month with office visit. - Given her multinodular goiter with 1.1 cm left lobe nodule, she will need repeat thyroid sonogram in 6 months as well. -Her presenting nonspecific complaints are out of proportion for the subclinical hyperthyroidism she has, especially thinning of her hair. She may benefit from dermatology evaluation although she admits that she has used different hair products in the past including relaxers which are known to cause hair thinning in the long-term.  - I will include TPO and thyroglobulin antibodies to see if she has any autoimmune antithyroid background.  - I advised patient to maintain close follow up with Tula Nakayama, MD for primary care needs. Follow up plan: Return in about 6 months (around 07/01/2017) for Thyroid / Neck Ultrasound, follow up with pre-visit labs.  Glade Lloyd, MD Phone: [phone]  Fax: [phone]   12/30/2016, 10:06 AM

## 2017-01-16 ENCOUNTER — Encounter: Payer: Self-pay | Admitting: Orthopedic Surgery

## 2017-01-16 ENCOUNTER — Ambulatory Visit: Payer: BC Managed Care – PPO | Admitting: Orthopedic Surgery

## 2017-02-20 ENCOUNTER — Ambulatory Visit
Admission: RE | Admit: 2017-02-20 | Discharge: 2017-02-20 | Disposition: A | Discharge status: Home / Self Care | Payer: BC Managed Care – PPO | Source: Ambulatory Visit | Attending: Family Medicine | Admitting: Family Medicine

## 2017-02-20 DIAGNOSIS — Z1231 Encounter for screening mammogram for malignant neoplasm of breast: Secondary | ICD-10-CM

## 2017-02-23 ENCOUNTER — Ambulatory Visit (INDEPENDENT_AMBULATORY_CARE_PROVIDER_SITE_OTHER): Payer: BC Managed Care – PPO | Admitting: Family Medicine

## 2017-02-23 VITALS — BP 118/78 | HR 80 | Resp 15 | Ht <= 58 in | Wt 184.0 lb

## 2017-02-23 DIAGNOSIS — J301 Allergic rhinitis due to pollen: Secondary | ICD-10-CM | POA: Diagnosis not present

## 2017-02-23 DIAGNOSIS — J309 Allergic rhinitis, unspecified: Secondary | ICD-10-CM | POA: Diagnosis not present

## 2017-02-23 DIAGNOSIS — K219 Gastro-esophageal reflux disease without esophagitis: Secondary | ICD-10-CM

## 2017-02-23 DIAGNOSIS — F411 Generalized anxiety disorder: Secondary | ICD-10-CM

## 2017-02-23 DIAGNOSIS — F5101 Primary insomnia: Secondary | ICD-10-CM | POA: Diagnosis not present

## 2017-02-23 MED ORDER — OMEPRAZOLE 40 MG PO CPDR: 40.0000 mg | DELAYED_RELEASE_CAPSULE | Freq: Every day | ORAL | 3 refills | Status: DC

## 2017-02-23 MED ORDER — ALPRAZOLAM 1 MG PO TABS: 1.0000 mg | ORAL_TABLET | Freq: Every evening | ORAL | 0 refills | Status: DC | PRN

## 2017-02-23 MED ORDER — TEMAZEPAM 15 MG PO CAPS: 15.0000 mg | ORAL_CAPSULE | Freq: Every evening | ORAL | 3 refills | Status: DC | PRN

## 2017-02-23 MED ORDER — CLOTRIMAZOLE-BETAMETHASONE 1-0.05 % EX CREA: TOPICAL_CREAM | CUTANEOUS | 0 refills | Status: DC

## 2017-02-23 MED ORDER — NYSTATIN 100000 UNIT/GM EX POWD: Freq: Two times a day (BID) | CUTANEOUS | 0 refills | Status: DC

## 2017-02-23 MED ORDER — BUSPIRONE HCL 7.5 MG PO TABS: 7.5000 mg | ORAL_TABLET | Freq: Three times a day (TID) | ORAL | 3 refills | Status: DC

## 2017-02-23 NOTE — Patient Instructions (Signed)
F/u in 3.  month, call if you need me before   New for anxiety is three times daily buspar , and restoril at night for sleep  Ten xanax only for panic if needed  Lavina Hamman is prescribed if not covered , then get oTC  It is important that you exercise regularly at least 30 minutes 5 times a week. If you develop chest pain, have severe difficulty breathing, or feel very tired, stop exercising immediately and seek medical attention    Please work on good  health habits so that your health will improve. 1. Commitment to daily physical activity for 30 to 60  minutes, if you are able to do this.  2. Commitment to wise food choices. Aim for half of your  food intake to be vegetable and fruit, one quarter starchy foods, and one quarter protein. Try to eat on a regular schedule  3 meals per day, snacking between meals should be limited to vegetables or fruits or small portions of nuts. 64 ounces of water per day is generally recommended, unless you have specific health conditions, like heart failure or kidney failure where you will need to limit fluid intake.  3. Commitment to sufficient and a  good quality of physical and mental rest daily, generally between 6 to 8 hours per day.  WITH PERSISTANCE AND PERSEVERANCE, THE IMPOSSIBLE , BECOMES THE NORM!   Thank you  for choosing [facility]. We consider it a privelige to serve you.  Delivering excellent health care in a caring and  compassionate way is our goal.  Partnering with you,  so that together we can achieve this goal is our strategy.

## 2017-02-26 ENCOUNTER — Encounter: Payer: Self-pay | Admitting: Family Medicine

## 2017-02-26 NOTE — Assessment & Plan Note (Signed)
unchnaged Patient re-educated about  the importance of commitment to a  minimum of 150 minutes of exercise per week.  The importance of healthy food choices with portion control discussed. Encouraged to start a food diary, count calories and to consider  joining a support group. Sample diet sheets offered. Goals set by the patient for the next several months.   Weight /BMI 02/23/2017 12/30/2016 11/23/2016  WEIGHT 184 lb 183 lb 187 lb 0.6 oz  HEIGHT 4\' 9"  4\' 9"  4\' 9"   BMI 39.82 kg/m2 39.6 kg/m2 40.48 kg/m2

## 2017-02-26 NOTE — Assessment & Plan Note (Signed)
Start buspar consistently and use xanax sparingly for panic only, continue therapy

## 2017-02-26 NOTE — Assessment & Plan Note (Signed)
Resume medication will buy oTC if not covered by her insurance

## 2017-02-26 NOTE — Progress Notes (Signed)
   [name] [name]     MRN: [id number]      DOB: 03/20/1971   HPI [name] [name] is here for follow up and re-evaluation of chronic medical conditions, medication management and review of any available recent lab and radiology data.  Preventive health is updated, specifically  Cancer screening and Immunization.   Questions or concerns regarding consultations or procedures which the PT has had in the interim are  addressed. The PT denies any adverse reactions to current medications since the last visit.  C/o uncontrolled anxiety is willing to try new medication management , also reports poor sleep and is struggling with adjusting to all the changes in her body, also her son recently had a fracture C/o reflux symptoms , needs to resume medication for this, will buy oTC if needed  ROS Denies recent fever or chills. Denies sinus pressure, nasal congestion, ear pain or sore throat. Denies chest congestion, productive cough or wheezing. Denies chest pains, palpitations and leg swelling Denies dysuria, frequency, hesitancy or incontinence. Denies joint pain, swelling and limitation in mobility. Denies headaches, seizures, numbness, or tingling.  Denies skin break down or rash.   PE  BP 118/78   Pulse 80   Resp 15   Ht 4\' 9"  (1.448 m)   Wt 184 lb (83.5 kg)   LMP 11/17/2015   BMI 39.82 kg/m   Patient alert and oriented and in no cardiopulmonary distress.  HEENT: No facial asymmetry, EOMI,   oropharynx pink and moist.  Neck supple no JVD, no mass.  Chest: Clear to auscultation bilaterally.  CVS: S1, S2 no murmurs, no S3.Regular rate.  ABD: Soft non tender.   Ext: No edema  MS: Adequate ROM spine, shoulders, hips and knees.  Skin: Intact, no ulcerations or rash noted.  Psych: Good eye contact, normal affect. Memory intact not anxious or depressed appearing.  CNS: CN 2-12 intact, power,  normal throughout.no focal deficits noted.   Assessment & Plan  ANXIETY DISORDER,  GENERALIZED Start buspar consistently and use xanax sparingly for panic only, continue therapy  Morbid obesity ([place]) unchnaged Patient re-educated about  the importance of commitment to a  minimum of 150 minutes of exercise per week.  The importance of healthy food choices with portion control discussed. Encouraged to start a food diary, count calories and to consider  joining a support group. Sample diet sheets offered. Goals set by the patient for the next several months.   Weight /BMI 02/23/2017 12/30/2016 11/23/2016  WEIGHT 184 lb 183 lb 187 lb 0.6 oz  HEIGHT 4\' 9"  4\' 9"  4\' 9"   BMI 39.82 kg/m2 39.6 kg/m2 40.48 kg/m2      ALLERGIC RHINITIS, SEASONAL Controlled, no change in medication   Allergic sinusitis Controlled, no change in medication   Insomnia Sleep hygiene reviewed and written information offered also. Prescription sent for  medication needed.   GERD Resume medication will buy oTC if not covered by her insurance

## 2017-02-26 NOTE — Assessment & Plan Note (Signed)
Sleep hygiene reviewed and written information offered also. Prescription sent for  medication needed.  

## 2017-02-26 NOTE — Assessment & Plan Note (Signed)
Controlled, no change in medication  

## 2017-02-28 ENCOUNTER — Ambulatory Visit: Payer: BC Managed Care – PPO | Admitting: Family Medicine

## 2017-04-05 ENCOUNTER — Telehealth: Payer: Self-pay | Admitting: Family Medicine

## 2017-04-05 ENCOUNTER — Other Ambulatory Visit: Payer: Self-pay | Admitting: Family Medicine

## 2017-04-05 MED ORDER — ALPRAZOLAM 1 MG PO TABS: 1.0000 mg | ORAL_TABLET | Freq: Every day | ORAL | 2 refills | Status: DC

## 2017-04-05 NOTE — Telephone Encounter (Signed)
Printed please send and let her know

## 2017-04-05 NOTE — Telephone Encounter (Signed)
States she has tried the buspar and it is not working for her anxiety. She understands that you only gave to 10 xanax to take only when needed but sometimes she needs it everyday. She said she has tried everything you suggested but she needs it more often than that. States she is willing to make another appt to discuss if needed

## 2017-04-05 NOTE — Telephone Encounter (Signed)
New Message  Pt voiced on nurse line wanting to speak to [facility] but pt did not go into details as to what.  Please f/u

## 2017-04-06 NOTE — Telephone Encounter (Signed)
Sent. Called pt no answer

## 2017-04-17 ENCOUNTER — Encounter: Payer: Self-pay | Admitting: Skilled Nursing Facility1

## 2017-04-17 ENCOUNTER — Encounter: Payer: BC Managed Care – PPO | Attending: Surgery | Admitting: Skilled Nursing Facility1

## 2017-04-17 DIAGNOSIS — Z713 Dietary counseling and surveillance: Secondary | ICD-10-CM | POA: Diagnosis not present

## 2017-04-17 DIAGNOSIS — E669 Obesity, unspecified: Secondary | ICD-10-CM

## 2017-04-17 NOTE — Progress Notes (Signed)
  Follow-up visit:  2.5 months Post-Operative sleeve gastrectomy Surgery  Medical Nutrition Therapy:  Appt start time: 505 end time:  550  Primary concerns today: Post-operative Bariatric Surgery Nutrition Management. Pt states she needs to take Prilosec every day but does not. Pt states she has to have something in her stomach every 2 hours to keep it from hurting.     Surgery date: 08/15/2016 Surgery type: sleeve gastrectomy Start weight at [facility]: 216 lbs on 05/04/2016, 222.2 lbs on 08/01/16 Weight today: 186.2 Weight change:  6 lbs Total weight lost: 37 lbs   Goal weight: 120-160 lbs   TANITA  BODY COMP RESULTS  08/01/16 08/30/16 09/14/16 10/24/16 04/17/2017   BMI (kg/m^2) 48.1 43.5 42.8 41.6 40.3   Fat Mass (lbs) 114 100.6 98.2 88.4 85   Fat Free Mass (lbs) 108.2 100.6 101.6 103.8 101.2   Total Body Water (lbs) 78.6 72.4 73.2 74.4 72.4    Preferred Learning Style:   No preference indicated   Learning Readiness:   Ready  24-hr recall: B (8AM): Premier protein shake (30g)---bacon and cheese and eggs---grits  Snk (AM): oreos---candy---protein shake L (11 AM - 1 PM): chicken----pinto beans Snk (4:97 PM): 3 slices deli [place] (02O) D (6PM): [place] and greens (14-21g) Snk (PM): cheesepuffs  Fluid intake: mountain dew (8-10am and stops at 10 ounces), protein water, premier protein, bottle water: MAYBE 20 ounces of water  Estimated total protein intake: at least 60 per patient  Medications: see list Supplementation: taking flinstones, calcium, vitamin b12  Drinking while eating: no Hair loss: no Carbonated beverages: yes N/V/D/C: nausea; constipation Dumping syndrome: no Having you been chewing well: no Chewing/swallowing difficulties: no Changes in vision: no Changes to mood/headaches: no Hair loss/Changes to skin/Changes to nails: no Any difficulty focusing or concentrating: no Sweating: no Dizziness/Lightheaded: no Palpitations: no  Carbonated beverages:  yes Abdominal Pain: no  Recent physical activity:  ADL's  Progress Towards Goal(s):  In progress.  Handouts given during visit include:  none   Nutritional Diagnosis:  [place]-3.3 Overweight/obesity related to past poor dietary habits and physical inactivity as evidenced by patient w/ recent sleeve gastrectomy surgery following dietary guidelines for continued weight loss.     Intervention:  Nutrition education provided.  Goals: -Try sparkling water -Increase the water to 4 bottles every day -Get the bariatric advantage multivitamin -Try kefir or kefir cup in the yogurt aisle  -Try to move throughout the week  Teaching Method Utilized:  Visual Auditory Hands on  Barriers to learning/adherence to lifestyle change: none  Demonstrated degree of understanding via:  Teach Back   Monitoring/Evaluation:  Dietary intake, exercise, and body weight. Follow up prn.

## 2017-04-17 NOTE — Patient Instructions (Addendum)
-  Try sparkling water  -Increase the water to 4 bottles every day  -Get the bariatric advantage multivitamin  -Try kefir or kefir cup in the yogurt aisle   -Try to move throughout the week

## 2017-04-22 ENCOUNTER — Other Ambulatory Visit: Payer: Self-pay | Admitting: Family Medicine

## 2017-05-29 ENCOUNTER — Other Ambulatory Visit: Payer: Self-pay | Admitting: Family Medicine

## 2017-06-07 ENCOUNTER — Encounter: Payer: Self-pay | Admitting: Family Medicine

## 2017-06-07 DIAGNOSIS — E559 Vitamin D deficiency, unspecified: Secondary | ICD-10-CM

## 2017-06-07 DIAGNOSIS — R7989 Other specified abnormal findings of blood chemistry: Secondary | ICD-10-CM

## 2017-06-14 ENCOUNTER — Ambulatory Visit: Payer: BC Managed Care – PPO | Admitting: Skilled Nursing Facility1

## 2017-06-19 ENCOUNTER — Other Ambulatory Visit: Payer: Self-pay | Admitting: Family Medicine

## 2017-06-22 ENCOUNTER — Ambulatory Visit: Payer: BC Managed Care – PPO | Admitting: Family Medicine

## 2017-06-26 ENCOUNTER — Ambulatory Visit (HOSPITAL_COMMUNITY): Admission: RE | Admit: 2017-06-26 | Payer: BC Managed Care – PPO | Source: Ambulatory Visit

## 2017-06-30 LAB — COMPLETE METABOLIC PANEL WITH GFR
AG RATIO: 1.2 (calc) (ref 1.0–2.5)
ALT: 6 U/L (ref 6–29)
AST: 12 U/L (ref 10–35)
Albumin: 3.7 g/dL (ref 3.6–5.1)
Alkaline phosphatase (APISO): 68 U/L (ref 33–115)
BUN: 12 mg/dL (ref 7–25)
CALCIUM: 9 mg/dL (ref 8.6–10.2)
CO2: 28 mmol/L (ref 20–32)
CREATININE: 0.75 mg/dL (ref 0.50–1.10)
Chloride: 106 mmol/L (ref 98–110)
GFR, EST AFRICAN AMERICAN: 110 mL/min/{1.73_m2} (ref >=60)
GFR, EST NON AFRICAN AMERICAN: 95 mL/min/{1.73_m2} (ref >=60)
GLOBULIN: 3 g/dL (ref 1.9–3.7)
Glucose, Bld: 81 mg/dL (ref 65–99)
POTASSIUM: 4.2 mmol/L (ref 3.5–5.3)
SODIUM: 138 mmol/L (ref 135–146)
TOTAL PROTEIN: 6.7 g/dL (ref 6.1–8.1)
Total Bilirubin: 0.4 mg/dL (ref 0.2–1.2)

## 2017-06-30 LAB — LIPID PANEL
CHOLESTEROL: 163 mg/dL (ref <200)
HDL: 78 mg/dL (ref >50)
LDL Cholesterol (Calc): 70 mg/dL (calc)
Non-HDL Cholesterol (Calc): 85 mg/dL (calc) (ref <130)
Total CHOL/HDL Ratio: 2.1 (calc) (ref <5.0)
Triglycerides: 71 mg/dL (ref <150)

## 2017-06-30 LAB — VITAMIN D 25 HYDROXY (VIT D DEFICIENCY, FRACTURES): Vit D, 25-Hydroxy: 80 ng/mL (ref 30–100)

## 2017-06-30 LAB — TSH: TSH: 0.3 mIU/L — ABNORMAL LOW

## 2017-07-02 ENCOUNTER — Other Ambulatory Visit: Payer: Self-pay | Admitting: Family Medicine

## 2017-07-03 ENCOUNTER — Ambulatory Visit: Payer: BC Managed Care – PPO | Admitting: "Endocrinology

## 2017-07-04 ENCOUNTER — Telehealth: Payer: Self-pay | Admitting: Family Medicine

## 2017-07-05 ENCOUNTER — Ambulatory Visit (INDEPENDENT_AMBULATORY_CARE_PROVIDER_SITE_OTHER): Payer: BC Managed Care – PPO | Admitting: Family Medicine

## 2017-07-05 ENCOUNTER — Other Ambulatory Visit: Payer: Self-pay

## 2017-07-05 ENCOUNTER — Encounter: Payer: Self-pay | Admitting: Family Medicine

## 2017-07-05 VITALS — BP 122/82 | HR 86 | Temp 98.6°F | Resp 16 | Ht <= 58 in | Wt 185.2 lb

## 2017-07-05 DIAGNOSIS — K219 Gastro-esophageal reflux disease without esophagitis: Secondary | ICD-10-CM

## 2017-07-05 DIAGNOSIS — F411 Generalized anxiety disorder: Secondary | ICD-10-CM

## 2017-07-05 DIAGNOSIS — I471 Supraventricular tachycardia: Secondary | ICD-10-CM

## 2017-07-05 DIAGNOSIS — I4719 Other supraventricular tachycardia: Secondary | ICD-10-CM

## 2017-07-05 DIAGNOSIS — J309 Allergic rhinitis, unspecified: Secondary | ICD-10-CM | POA: Diagnosis not present

## 2017-07-05 DIAGNOSIS — F5104 Psychophysiologic insomnia: Secondary | ICD-10-CM

## 2017-07-05 MED ORDER — PROMETHAZINE HCL 25 MG PO TABS: ORAL_TABLET | ORAL | 0 refills | Status: DC

## 2017-07-05 MED ORDER — FLUTICASONE PROPIONATE 50 MCG/ACT NA SUSP: 2.0000 | Freq: Every day | NASAL | 6 refills | Status: DC

## 2017-07-05 MED ORDER — CLOTRIMAZOLE-BETAMETHASONE 1-0.05 % EX CREA: TOPICAL_CREAM | CUTANEOUS | 0 refills | Status: DC

## 2017-07-05 MED ORDER — NYSTATIN 100000 UNIT/GM EX POWD: Freq: Two times a day (BID) | CUTANEOUS | 3 refills | Status: DC

## 2017-07-05 NOTE — Patient Instructions (Addendum)
F/u in 4 months, call if you need me sooner  Come in or get flu vaccine as soon as you decide you need this.  Please commit to lunch break of 45 mins minimum daily and split activities as we  Discussed  Reducing xanax to maximum of 4 tablets weekly and re comittimg to therapy and enjoying urban dance  It is important that you exercise regularly at least 30 minutes 5 times a week. If you develop chest pain, have severe difficulty breathing, or feel very tired, stop exercising immediately and seek medical attention  Please work on good  health habits so that your health will improve. 1. Commitment to daily physical activity for 30 to 60  minutes, if you are able to do this.  2. Commitment to wise food choices. Aim for half of your  food intake to be vegetable and fruit, one quarter starchy foods, and one quarter protein. Try to eat on a regular schedule  3 meals per day, snacking between meals should be limited to vegetables or fruits or small portions of nuts. 64 ounces of water per day is generally recommended, unless you have specific health conditions, like heart failure or kidney failure where you will need to limit fluid intake.  3. Commitment to sufficient and a  good quality of physical and mental rest daily, generally between 6 to 8 hours per day.  WITH PERSISTANCE AND PERSEVERANCE, THE IMPOSSIBLE , BECOMES THE NORM!   Thank you  for choosing [facility]. We consider it a privelige to serve you.  Delivering excellent health care in a caring and  compassionate way is our goal.  Partnering with you,  so that together we can achieve this goal is our strategy.

## 2017-07-10 NOTE — Assessment & Plan Note (Signed)
Controlled, no change in medication  

## 2017-07-10 NOTE — Assessment & Plan Note (Signed)
Currently heart rate is regular and she is asymptomatic

## 2017-07-10 NOTE — Progress Notes (Signed)
   [name] [name]     MRN: [id number]      DOB: [birth year]   HPI [name] [name] is here for follow up and re-evaluation of chronic medical conditions, medication management and review of any available recent lab and radiology data.  Preventive health is updated, specifically  Cancer screening and Immunization.  Declines flu vaccine today but states will get at later date Has not been to therapist recently but needs to re establish, she is however working on cutting down on her dependence on xanax to control anxiety. The PT denies any adverse reactions to current medications since the last visit.  Anxious and concerned about the fact that she is at a standstill as far as weight loss is concerned, no regular exercise and also has to be more committed as far as diet is concerned, plans to work on both and goal for next visit is set  ROS Denies recent fever or chills. Denies sinus pressure, nasal congestion, ear pain or sore throat. Denies chest congestion, productive cough or wheezing. Denies chest pains, palpitations and leg swelling Denies abdominal pain, nausea, vomiting,diarrhea or constipation.   Denies dysuria, frequency, hesitancy or incontinence. Denies joint pain, swelling and limitation in mobility. Denies headaches, seizures, numbness, or tingling. Denies skin break down or rash.   PE  BP 122/82 (BP Location: Left Arm, Patient Position: Sitting, Cuff Size: Normal)   Pulse 86   Temp 98.6 F (37 C) (Other (Comment))   Resp 16   Ht 4\' 9"  (1.448 m)   Wt 185 lb 4 oz (84 kg)   LMP 11/17/2015   SpO2 98%   BMI 40.09 kg/m   Patient alert and oriented and in no cardiopulmonary distress.  HEENT: No facial asymmetry, EOMI,   oropharynx pink and moist.  Neck supple no JVD, no mass.  Chest: Clear to auscultation bilaterally.  CVS: S1, S2 no murmurs, no S3.Regular rate.  ABD: Soft non tender.   Ext: No edema  MS: Adequate ROM spine, shoulders, hips and knees.  Skin: Intact, no  ulcerations or rash noted.  Psych: Good eye contact, normal affect. Memory intact not anxious or depressed appearing.  CNS: CN 2-12 intact, power,  normal throughout.no focal deficits noted.   Assessment & Plan  ANXIETY DISORDER, GENERALIZED improving , however , needs to return to therapy so as to reduce dependence on xanax and improve overall mental health and coping strategies  Allergic sinusitis Controlled, no change in medication   Morbid obesity (HCC) Unchanged, needs to commit to lifestyle changes needed for weight loss. Patient re-educated about  the importance of commitment to a  minimum of 150 minutes of exercise per week.  The importance of healthy food choices with portion control discussed. Encouraged to start a food diary, count calories and to consider  joining a support group. Sample diet sheets offered. Goals set by the patient for the next several months.   Weight /BMI 07/05/2017 04/17/2017 02/23/2017  WEIGHT 185 lb 4 oz 186 lb 3.2 oz 184 lb  HEIGHT 4\' 9"  4\' 9"  4\' 9"   BMI 40.09 kg/m2 40.29 kg/m2 39.82 kg/m2      Insomnia Sleep hygiene reviewed and written information offered also.   Paroxysmal atrial tachycardia (HCC) Currently heart rate is regular and she is asymptomatic  GERD Encouraged ot reduce PPI dosage gradually and the hope is to be able to transit on down to H2 blocker for long term use

## 2017-07-10 NOTE — Assessment & Plan Note (Signed)
Sleep hygiene reviewed and written information offered also. ?. ? ?

## 2017-07-10 NOTE — Assessment & Plan Note (Signed)
Encouraged ot reduce PPI dosage gradually and the hope is to be able to transit on down to H2 blocker for long term use

## 2017-07-10 NOTE — Assessment & Plan Note (Signed)
Unchanged, needs to commit to lifestyle changes needed for weight loss. Patient re-educated about  the importance of commitment to a  minimum of 150 minutes of exercise per week.  The importance of healthy food choices with portion control discussed. Encouraged to start a food diary, count calories and to consider  joining a support group. Sample diet sheets offered. Goals set by the patient for the next several months.   Weight /BMI 07/05/2017 04/17/2017 02/23/2017  WEIGHT 185 lb 4 oz 186 lb 3.2 oz 184 lb  HEIGHT 4\' 9"  4\' 9"  4\' 9"   BMI 40.09 kg/m2 40.29 kg/m2 39.82 kg/m2

## 2017-07-10 NOTE — Assessment & Plan Note (Signed)
improving , however , needs to return to therapy so as to reduce dependence on xanax and improve overall mental health and coping strategies

## 2017-07-13 ENCOUNTER — Telehealth: Payer: Self-pay | Admitting: Family Medicine

## 2017-07-13 NOTE — Telephone Encounter (Signed)
Please clarify directions for phenegran- is it supposed to be weekly?

## 2017-07-18 NOTE — Telephone Encounter (Signed)
Spoke with patient and pharmacy and verified

## 2017-07-18 NOTE — Telephone Encounter (Signed)
It is as written , I have also spoken with the pt about it today

## 2017-07-18 NOTE — Telephone Encounter (Signed)
Allene Dillon Pharmacist # [phone] Calling for Clarification on Promethazine 25mg . Trying to get clarification since 07/05/17 Is #20 tablets  To last for 20 weeks? Rx written as once weekly for severe nausea.

## 2017-07-18 NOTE — Telephone Encounter (Signed)
Yes this is to last for twenty weeks

## 2017-08-07 ENCOUNTER — Other Ambulatory Visit: Payer: Self-pay | Admitting: Family Medicine

## 2017-08-07 ENCOUNTER — Ambulatory Visit: Payer: BC Managed Care – PPO | Admitting: Family Medicine

## 2017-08-07 ENCOUNTER — Telehealth: Payer: Self-pay | Admitting: *Deleted

## 2017-08-07 MED ORDER — FLUCONAZOLE 150 MG PO TABS: 150.0000 mg | ORAL_TABLET | Freq: Once | ORAL | 0 refills | Status: AC

## 2017-08-07 MED ORDER — AZITHROMYCIN 250 MG PO TABS: ORAL_TABLET | ORAL | 0 refills | Status: DC

## 2017-08-07 NOTE — Telephone Encounter (Signed)
Patient came in late to her appt today to see Dr Mannie Stabile, patient is having CONGESTED,RUNNY NOSE. When can this patient be scheduled

## 2017-08-07 NOTE — Telephone Encounter (Signed)
Z pack and fluconazole are sent to [facility] please let her know

## 2017-08-07 NOTE — Telephone Encounter (Signed)
Nasal congestion, sinus pain and pressure and green mucus. Started Wednesday night. No fever. Came in late to her appt today with [facility] because she said she was told a different time. Can something be called into the pharmacy for her. Please advise

## 2017-08-08 NOTE — Telephone Encounter (Signed)
Patient aware.

## 2017-10-05 ENCOUNTER — Other Ambulatory Visit: Payer: Self-pay | Admitting: Family Medicine

## 2017-10-09 ENCOUNTER — Other Ambulatory Visit: Payer: Self-pay

## 2017-10-09 MED ORDER — ALPRAZOLAM 1 MG PO TABS: 1.0000 mg | ORAL_TABLET | Freq: Every evening | ORAL | 1 refills | Status: DC | PRN

## 2017-10-09 MED ORDER — ALPRAZOLAM 1 MG PO TABS: 1.0000 mg | ORAL_TABLET | Freq: Every evening | ORAL | 4 refills | Status: DC | PRN

## 2017-11-13 ENCOUNTER — Encounter: Payer: Self-pay | Admitting: Family Medicine

## 2017-11-13 ENCOUNTER — Ambulatory Visit: Payer: BC Managed Care – PPO | Admitting: Family Medicine

## 2017-11-13 VITALS — BP 130/80 | HR 83 | Resp 16 | Ht <= 58 in | Wt 191.0 lb

## 2017-11-13 DIAGNOSIS — J309 Allergic rhinitis, unspecified: Secondary | ICD-10-CM

## 2017-11-13 DIAGNOSIS — K219 Gastro-esophageal reflux disease without esophagitis: Secondary | ICD-10-CM

## 2017-11-13 DIAGNOSIS — G4709 Other insomnia: Secondary | ICD-10-CM

## 2017-11-13 DIAGNOSIS — R7302 Impaired glucose tolerance (oral): Secondary | ICD-10-CM | POA: Diagnosis not present

## 2017-11-13 DIAGNOSIS — E0590 Thyrotoxicosis, unspecified without thyrotoxic crisis or storm: Secondary | ICD-10-CM | POA: Diagnosis not present

## 2017-11-13 MED ORDER — CLOTRIMAZOLE-BETAMETHASONE 1-0.05 % EX CREA: TOPICAL_CREAM | CUTANEOUS | 1 refills | Status: DC

## 2017-11-13 MED ORDER — ALPRAZOLAM 1 MG PO TABS: 1.0000 mg | ORAL_TABLET | Freq: Every day | ORAL | 5 refills | Status: DC

## 2017-11-13 NOTE — Patient Instructions (Signed)
F/u in 5 .5 months, call if you need  me sooner  CBC, fasting lipid, cmp and eGFr, hBA1C, TSH. Vit D in 5.5 months  Weight loss gal of 2 to 3  Pounds per  Month.  Please increase plants in your diet and reduce sugar/ sweet  It is important that you exercise regularly at least 30 minutes 5 times a week. If you develop chest pain, have severe difficulty breathing, or feel very tired, stop exercising immediately and seek medical attention

## 2017-11-14 ENCOUNTER — Encounter: Payer: Self-pay | Admitting: Family Medicine

## 2017-11-14 NOTE — Assessment & Plan Note (Signed)
Controlled, no change in medication  

## 2017-11-14 NOTE — Progress Notes (Signed)
   [name] [name]     MRN: [id number]      DOB: April 22, 1971   HPI [name] [name] is here for follow up and re-evaluation of chronic medical conditions, medication management and review of any available recent lab and radiology data.  Preventive health is updated, specifically  Cancer screening and Immunization.    The PT denies any adverse reactions to current medications since the last visit.  C/o weight regain, intends to reverse this trend Plans to resume therapy for anxiety, but reports ongoing need for xanax for sleep , has tried to do without this ROS Denies recent fever or chills. Denies sinus pressure, nasal congestion, ear pain or sore throat. Denies chest congestion, productive cough or wheezing. Denies chest pains, palpitations and leg swelling Denies abdominal pain, nausea, vomiting,diarrhea or constipation.   Denies dysuria, frequency, hesitancy or incontinence. Denies joint pain, swelling and limitation in mobility. Denies headaches, seizures, numbness, or tingling. Denies skin break down or rash.   PE  BP 130/80   Pulse 83   Resp 16   Ht 4\' 9"  (1.448 m)   Wt 191 lb (86.6 kg)   LMP 11/17/2015   SpO2 98%   BMI 41.33 kg/m   Patient alert and oriented and in no cardiopulmonary distress.  HEENT: No facial asymmetry, EOMI,   oropharynx pink and moist.  Neck supple no JVD, no mass.  Chest: Clear to auscultation bilaterally.  CVS: S1, S2 no murmurs, no S3.Regular rate.  ABD: Soft non tender.   Ext: No edema  MS: Adequate ROM spine, shoulders, hips and knees.  Skin: Intact, no ulcerations or rash noted.  Psych: Good eye contact, normal affect. Memory intact not anxious or depressed appearing.  CNS: CN 2-12 intact, power,  normal throughout.no focal deficits noted.   Assessment & Plan  Insomnia Sleep hygiene reviewed and written information offered also. Prescription sent for  medication needed.   Morbid obesity ([place]) Deteriorated. Patient re-educated about   the importance of commitment to a  minimum of 150 minutes of exercise per week.  The importance of healthy food choices with portion control discussed. Encouraged to start a food diary, count calories and to consider  joining a support group. Sample diet sheets offered. Goals set by the patient for the next several months.   Weight /BMI 11/13/2017 07/05/2017 04/17/2017  WEIGHT 191 lb 185 lb 4 oz 186 lb 3.2 oz  HEIGHT 4\' 9"  4\' 9"  4\' 9"   BMI 41.33 kg/m2 40.09 kg/m2 40.29 kg/m2      Allergic sinusitis Controlled, no change in medication   GERD Controlled, no change in medication

## 2017-11-14 NOTE — Assessment & Plan Note (Signed)
Deteriorated. Patient re-educated about  the importance of commitment to a  minimum of 150 minutes of exercise per week.  The importance of healthy food choices with portion control discussed. Encouraged to start a food diary, count calories and to consider  joining a support group. Sample diet sheets offered. Goals set by the patient for the next several months.   Weight /BMI 11/13/2017 07/05/2017 04/17/2017  WEIGHT 191 lb 185 lb 4 oz 186 lb 3.2 oz  HEIGHT 4\' 9"  4\' 9"  4\' 9"   BMI 41.33 kg/m2 40.09 kg/m2 40.29 kg/m2

## 2017-11-14 NOTE — Assessment & Plan Note (Signed)
Sleep hygiene reviewed and written information offered also. Prescription sent for  medication needed.  

## 2018-02-13 ENCOUNTER — Encounter: Payer: Self-pay | Admitting: Family Medicine

## 2018-03-08 ENCOUNTER — Ambulatory Visit: Payer: BC Managed Care – PPO | Admitting: Registered"

## 2018-04-02 ENCOUNTER — Encounter: Payer: BC Managed Care – PPO | Attending: Family Medicine | Admitting: Registered"

## 2018-04-02 ENCOUNTER — Encounter: Payer: Self-pay | Admitting: Registered"

## 2018-04-02 DIAGNOSIS — E669 Obesity, unspecified: Secondary | ICD-10-CM

## 2018-04-02 DIAGNOSIS — Z6841 Body Mass Index (BMI) 40.0 and over, adult: Secondary | ICD-10-CM | POA: Insufficient documentation

## 2018-04-02 DIAGNOSIS — Z713 Dietary counseling and surveillance: Secondary | ICD-10-CM | POA: Diagnosis not present

## 2018-04-02 DIAGNOSIS — Z9884 Bariatric surgery status: Secondary | ICD-10-CM | POA: Diagnosis not present

## 2018-04-02 NOTE — Progress Notes (Signed)
  Follow-up visit: 1 year and 7 months Post-Operative sleeve gastrectomy Surgery  Medical Nutrition Therapy:  Appt start time: 4:35 end time: 5:22  Primary concerns today: Post-operative Bariatric Surgery Nutrition Management.  Surgery date: 08/15/2016 Surgery type: Sleeve gastrectomy Start weight at [facility]: 216 lbs on 05/04/2016, 222.2 lbs on 08/01/16 Weight today: N/A; pt declined Weight change: N/A; weight from previous visit 186.2 (04/17/2017) Total weight lost: 37 lbs  Goal weight: 120-160 lbs   TANITA  BODY COMP RESULTS  08/01/16 08/30/16 09/14/16 10/24/16 04/17/2017 04/02/2018   BMI (kg/m^2) 48.1 43.5 42.8 41.6 40.3    Fat Mass (lbs) 114 100.6 98.2 88.4 85    Fat Free Mass (lbs) 108.2 100.6 101.6 103.8 101.2    Total Body Water (lbs) 78.6 72.4 73.2 74.4 72.4    Pt arrives declining weight and states she needs to get back on track. Pt states she wants healthy living and how to apply to life. Pt states she is less focused on weight now.   Pt reports she limits herself to 8 oz of Sundrop and throws away the rest of the soda. Pt states she automatically halves her meals. Pt states she does a great job of stopping food intake when satisfied. Pt states she does not like water but prefers it to be plain rather than flavored. Pt has an office job.    Preferred Learning Style:   No preference indicated   Learning Readiness:   Ready  24-hr recall: B (8AM): bacon (3g), 1/2 cheese (3g) and 1/2 egg (3g), 1/2 pancake  Snk (AM): none L (11 AM - 1 PM): 1/2 cheeseburger (21g) with 1/2 bun  Snk (1:30 PM): 4 cookies D (6PM): 2 drummettes (14g), 1/2 collard greens, 1/2 macaroni  Snk (PM): none  Fluid intake: Sundrop (8 oz), alcoholic cranberry mango juice (16 oz), water (24 oz); ~24-32 oz Estimated total protein intake: at least 60 per patient  Medications: see list Supplementation: taking flinstones, calcium  Drinking while eating: no Hair loss: no Carbonated beverages: yes N/V/D/C:  nausea; constipation Dumping syndrome: no Having you been chewing well: no Chewing/swallowing difficulties: no Changes in vision: no Changes to mood/headaches: no Hair loss/Changes to skin/Changes to nails: no Any difficulty focusing or concentrating: no Sweating: no Dizziness/Lightheaded: no Palpitations: no  Carbonated beverages: yes Abdominal Pain: no  Recent physical activity:  ADL's  Progress Towards Goal(s):  In progress.  Handouts given during visit include:  Vitamin and Mineral Recommendations   Nutritional Diagnosis:  [place]-3.3 Overweight/obesity related to past poor dietary habits and physical inactivity as evidenced by patient w/ recent sleeve gastrectomy surgery following dietary guidelines for continued weight loss.     Intervention:  Nutrition education provided. Pt was educated and counseled on the importance of meeting daily protein and fluid goals and how to do it. Pt was educated on appropriate multivitamin and calcium supplements. Pt was in agreement with goals listed.  Goals: - Make appointment surgeon for annual check-up.  - Aim to sip water between officers and citizens.  - Aim for at least 40-48 ounces of fluid a day.  - Add in sugar-free popsicles to increase fluid intake.  - Get back on track with tracking protein intake.  - Get bariatric multivitamin.   Teaching Method Utilized:  Visual Auditory Hands on  Barriers to learning/adherence to lifestyle change: none  Demonstrated degree of understanding via:  Teach Back   Monitoring/Evaluation:  Dietary intake, exercise, and body weight. Follow up 1 month.

## 2018-04-02 NOTE — Patient Instructions (Addendum)
-   Make appointment surgeon for annual check-up.   - Aim to sip water between officers and citizens.   - Aim for at least 40-48 ounces of fluid a day.   - Add in sugar-free popsicles to increase fluid intake.   - Get back on track with tracking protein intake.   - Get bariatric multivitamin.

## 2018-04-30 ENCOUNTER — Ambulatory Visit (INDEPENDENT_AMBULATORY_CARE_PROVIDER_SITE_OTHER): Payer: BC Managed Care – PPO | Admitting: Family Medicine

## 2018-04-30 ENCOUNTER — Encounter: Payer: Self-pay | Admitting: Family Medicine

## 2018-04-30 ENCOUNTER — Other Ambulatory Visit: Payer: Self-pay

## 2018-04-30 VITALS — BP 118/76 | HR 60 | Resp 12 | Ht 59.0 in | Wt 181.0 lb

## 2018-04-30 DIAGNOSIS — K219 Gastro-esophageal reflux disease without esophagitis: Secondary | ICD-10-CM

## 2018-04-30 DIAGNOSIS — R5382 Chronic fatigue, unspecified: Secondary | ICD-10-CM

## 2018-04-30 DIAGNOSIS — F411 Generalized anxiety disorder: Secondary | ICD-10-CM | POA: Diagnosis not present

## 2018-04-30 DIAGNOSIS — G4709 Other insomnia: Secondary | ICD-10-CM | POA: Diagnosis not present

## 2018-04-30 DIAGNOSIS — E559 Vitamin D deficiency, unspecified: Secondary | ICD-10-CM

## 2018-04-30 MED ORDER — ALPRAZOLAM 1 MG PO TABS: 1.0000 mg | ORAL_TABLET | Freq: Every day | ORAL | 5 refills | Status: DC

## 2018-04-30 NOTE — Patient Instructions (Addendum)
F/U in 5 months, call if you need me before  Please get fasting CBC, lipid, chem 7 , TSH and vit D 10 20 oer shortly after ( [facility])  Congrats on weight loss, keep it up  Please commit to 30 mins at least 5 days per week of exercise

## 2018-05-07 NOTE — Assessment & Plan Note (Signed)
Controlled, no change in medication Advised pt to reconsider and start therapy once more

## 2018-05-07 NOTE — Progress Notes (Signed)
   [name] [name]     MRN: [id number]      DOB: 10/17/1970   HPI [name] [name] is here for follow up and re-evaluation of chronic medical conditions, medication management and review of any available recent lab and radiology data.  Preventive health is updated, specifically  Cancer screening and Immunization.   Questions or concerns regarding consultations or procedures which the PT has had in the interim are  addressed. The PT denies any adverse reactions to current medications since the last visit.  C/o difficulty with weight loss an intends to continue current strategy as far as this is concerned. But needs to increase  regular exercise  ROS Denies recent fever or chills. Denies sinus pressure, nasal congestion, ear pain or sore throat. Denies chest congestion, productive cough or wheezing. Denies chest pains, palpitations and leg swelling Denies abdominal pain, nausea, vomiting,diarrhea or constipation.   Denies dysuria, frequency, hesitancy or incontinence. Denies joint pain, swelling and limitation in mobility. Denies headaches, seizures, numbness, or tingling. Denies depression, anxiety or insomnia. Denies skin break down or rash.   PE  BP 118/76 (BP Location: Right Arm, Patient Position: Sitting, Cuff Size: Large)   Pulse 60   Resp 12   Ht 4\' 11"  (1.499 m)   Wt 181 lb 0.6 oz (82.1 kg)   LMP 11/17/2015   SpO2 98% Comment: room air  BMI 36.57 kg/m   Patient alert and oriented and in no cardiopulmonary distress.  HEENT: No facial asymmetry, EOMI,   oropharynx pink and moist.  Neck supple no JVD, no mass.  Chest: Clear to auscultation bilaterally.  CVS: S1, S2 no murmurs, no S3.Regular rate.  ABD: Soft non tender.   Ext: No edema  MS: Adequate ROM spine, shoulders, hips and knees.  Skin: Intact, no ulcerations or rash noted.  Psych: Good eye contact, normal affect. Memory intact not anxious or depressed appearing.  CNS: CN 2-12 intact, power,  normal throughout.no  focal deficits noted.   Assessment & Plan  ANXIETY DISORDER, GENERALIZED Controlled, no change in medication Advised pt to reconsider and start therapy once more  Insomnia Sleep hygiene reviewed and written information offered also.She will continue xanx Prescription sent for  medication needed.   GERD Controlled, no change in medication   Morbid obesity ([place]) Improved. Patient re-educated about  the importance of commitment to a  minimum of 150 minutes of exercise per week.  The importance of healthy food choices with portion control discussed. Encouraged to start a food diary, count calories and to consider  joining a support group. Sample diet sheets offered. Goals set by the patient for the next several months.   Weight /BMI 04/30/2018 11/13/2017 07/05/2017  WEIGHT 181 lb 0.6 oz 191 lb 185 lb 4 oz  HEIGHT 4\' 11"  4\' 9"  4\' 9"   BMI 36.57 kg/m2 41.33 kg/m2 40.09 kg/m2

## 2018-05-07 NOTE — Assessment & Plan Note (Signed)
Improved. Patient re-educated about  the importance of commitment to a  minimum of 150 minutes of exercise per week.  The importance of healthy food choices with portion control discussed. Encouraged to start a food diary, count calories and to consider  joining a support group. Sample diet sheets offered. Goals set by the patient for the next several months.   Weight /BMI 04/30/2018 11/13/2017 07/05/2017  WEIGHT 181 lb 0.6 oz 191 lb 185 lb 4 oz  HEIGHT 4\' 11"  4\' 9"  4\' 9"   BMI 36.57 kg/m2 41.33 kg/m2 40.09 kg/m2

## 2018-05-07 NOTE — Assessment & Plan Note (Signed)
Sleep hygiene reviewed and written information offered also.She will continue xanx Prescription sent for  medication needed.

## 2018-05-07 NOTE — Assessment & Plan Note (Signed)
Controlled, no change in medication  

## 2018-05-09 ENCOUNTER — Encounter: Payer: BC Managed Care – PPO | Attending: Family Medicine | Admitting: Registered"

## 2018-05-09 ENCOUNTER — Encounter: Payer: Self-pay | Admitting: Registered"

## 2018-05-09 DIAGNOSIS — Z9884 Bariatric surgery status: Secondary | ICD-10-CM | POA: Insufficient documentation

## 2018-05-09 DIAGNOSIS — E669 Obesity, unspecified: Secondary | ICD-10-CM

## 2018-05-09 DIAGNOSIS — Z713 Dietary counseling and surveillance: Secondary | ICD-10-CM | POA: Insufficient documentation

## 2018-05-09 DIAGNOSIS — Z6841 Body Mass Index (BMI) 40.0 and over, adult: Secondary | ICD-10-CM | POA: Insufficient documentation

## 2018-05-09 NOTE — Patient Instructions (Addendum)
-   Keep up the great work with increasing water intake.   - Aim to increase fiber intake with non-starchy vegetables and beans.   - Keep up the great work with habits implemented.   - Check into attending support group.

## 2018-05-09 NOTE — Progress Notes (Signed)
Follow-up visit: 1 year and 9 months Post-Operative sleeve gastrectomy Surgery  Medical Nutrition Therapy:  Appt start time: 4:10 end time: 4:45  Primary concerns today: Post-operative Bariatric Surgery Nutrition Management.  Surgery date: 08/15/2016 Surgery type: Sleeve gastrectomy Start weight at [facility]: 216 lbs on 05/04/2016, 222.2 lbs on 08/01/16 Weight today: N/A; pt declined Weight change: N/A; weight from previous visit 186.2 (04/17/2017) Total weight lost: 37 lbs  Goal weight: 120-160 lbs   TANITA  BODY COMP RESULTS  08/01/16 08/30/16 09/14/16 10/24/16 04/17/2017   BMI (kg/m^2) 48.1 43.5 42.8 41.6 40.3   Fat Mass (lbs) 114 100.6 98.2 88.4 85   Fat Free Mass (lbs) 108.2 100.6 101.6 103.8 101.2   Total Body Water (lbs) 78.6 72.4 73.2 74.4 72.4   Pt arrives declining weight. Pt states she has lost 10 lbs and has noticed a change. Pt states she has had a chance  Pt states she has improved  Pt states she has increased water intake, drinking about 48 oz/day. Pt states she has started to drink shakes again to lose weight. Pt states she gets a take-out box as soon she gets her food when eating out to help with portion control. Pt states she has reduced soda intake. Pt states she is listening to her body for satisfaction cues. Pt states she has been waiting 30 minutes after eating to drink. Pt is making great changes since previous visit.   Pt states she wants healthy living and how to apply to life. Pt states she is less focused on weight now.   Pt has an office job.    Preferred Learning Style:   No preference indicated   Learning Readiness:   Ready  24-hr recall: B (8AM): protein shake or bacon (3g), 1/2 cheese (3g) and 1/2 egg (3g), 1/2 pancake  Snk (AM): none L (11 AM - 1 PM): cheese pizza or 1/2 cheeseburger (21g) with 1/2 bun  Snk (1:30 PM): nuts (7g) or peanut butter crackers D (6PM): 2 drummettes (14g), 1/2 collard greens, 1/2 macaroni  Snk (PM): none  Fluid intake:  Sundrop (2-4 oz), alcoholic cranberry mango juice (16 oz), water (48 oz); ~68 oz Estimated total protein intake: at least 60 per patient  Medications: see list Supplementation: taking flinstones, calcium  Drinking while eating: no Hair loss: no Carbonated beverages: yes N/V/D/C: nausea; constipation Dumping syndrome: no Having you been chewing well: no Chewing/swallowing difficulties: no Changes in vision: no Changes to mood/headaches: no Hair loss/Changes to skin/Changes to nails: no Any difficulty focusing or concentrating: no Sweating: no Dizziness/Lightheaded: no Palpitations: no  Carbonated beverages: yes Abdominal Pain: no  Recent physical activity:  ADL's  Progress Towards Goal(s):  In progress.  Handouts given during visit include:  Bariatric Surgery Support Group   Nutritional Diagnosis:  [place]-3.3 Overweight/obesity related to past poor dietary habits and physical inactivity as evidenced by patient w/ recent sleeve gastrectomy surgery following dietary guidelines for continued weight loss.     Intervention:  Nutrition education provided. Pt was educated and counseled on the importance of increasing non-starchy vegetable intake and celebrated for the behaviors she has implemented. Pt was in agreement with goals listed.  Goals: - Keep up the great work with increasing water intake.  - Aim to increase fiber intake with non-starchy vegetables and beans.  - Keep up the great work with habits implemented.  - Check into attending support group.   Teaching Method Utilized:  Visual Auditory Hands on  Barriers to learning/adherence to lifestyle change:  none  Demonstrated degree of understanding via:  Teach Back   Monitoring/Evaluation:  Dietary intake, exercise, and body weight. Follow up 1 month.

## 2018-06-07 ENCOUNTER — Ambulatory Visit: Payer: BC Managed Care – PPO | Admitting: Registered"

## 2018-09-18 ENCOUNTER — Ambulatory Visit: Payer: BC Managed Care – PPO | Admitting: Physician Assistant

## 2018-09-18 ENCOUNTER — Encounter

## 2018-10-01 ENCOUNTER — Encounter: Payer: Self-pay | Admitting: Family Medicine

## 2018-10-01 ENCOUNTER — Ambulatory Visit: Payer: BC Managed Care – PPO | Admitting: Family Medicine

## 2018-10-01 VITALS — BP 120/78 | HR 62 | Resp 12 | Ht <= 58 in | Wt 180.1 lb

## 2018-10-01 DIAGNOSIS — F5104 Psychophysiologic insomnia: Secondary | ICD-10-CM | POA: Diagnosis not present

## 2018-10-01 DIAGNOSIS — K219 Gastro-esophageal reflux disease without esophagitis: Secondary | ICD-10-CM

## 2018-10-01 MED ORDER — ALPRAZOLAM 1 MG PO TABS: 1.0000 mg | ORAL_TABLET | Freq: Every day | ORAL | 3 refills | Status: DC

## 2018-10-01 MED ORDER — OMEPRAZOLE 40 MG PO CPDR: 40.0000 mg | DELAYED_RELEASE_CAPSULE | Freq: Every day | ORAL | 3 refills | Status: DC

## 2018-10-01 MED ORDER — FLUTICASONE PROPIONATE 50 MCG/ACT NA SUSP: 2.0000 | Freq: Every day | NASAL | 6 refills | Status: DC

## 2018-10-01 NOTE — Patient Instructions (Signed)
F/U in 4 months, call if you need me before  Continue medications as before   Please commit to regular exercise, and to mindful food choice.  Thank you  for choosing [facility]. We consider it a privelige to serve you.  Delivering excellent health care in a caring and  compassionate way is our goal.  Partnering with you,  so that together we can achieve this goal is our strategy.

## 2018-10-05 ENCOUNTER — Encounter: Payer: Self-pay | Admitting: Family Medicine

## 2018-10-05 NOTE — Assessment & Plan Note (Signed)
Currently experiencing increased symptoms, uncontrolled. Re educated re the need to commit to daily medication and nasal flushes

## 2018-10-05 NOTE — Assessment & Plan Note (Addendum)
Unchanged obesity associated with anxiety ,insomnia and GERD Patient re-educated about  the importance of commitment to a  minimum of 150 minutes of exercise per week.  The importance of healthy food choices with portion control discussed. Encouraged to start a food diary, count calories and to consider  joining a support group. Sample diet sheets offered. Goals set by the patient for the next several months.   Weight /BMI 10/01/2018 04/30/2018 11/13/2017  WEIGHT 180 lb 1.3 oz 181 lb 0.6 oz 191 lb  HEIGHT 4\' 10"  4\' 11"  4\' 9"   BMI 37.64 kg/m2 36.57 kg/m2 41.33 kg/m2

## 2018-10-05 NOTE — Progress Notes (Signed)
   [name] [name]     MRN: [id number]      DOB: 08-25-1971   HPI [name] [name] is here for follow up and re-evaluation of chronic medical conditions, medication management and review of any available recent lab and radiology data.  Preventive health is updated, specifically  Cancer screening and Immunization.   Questions or concerns regarding consultations or procedures which the PT has had in the interim are  addressed. The PT denies any adverse reactions to current medications since the last visit.  There are no new concerns.  There are no specific complaints   ROS Denies recent fever or chills. C/o sinus pressure, nasal congestion,denies  ear pain or sore throat. Denies chest congestion, productive cough or wheezing. Denies chest pains, palpitations and leg swelling Denies abdominal pain, nausea, vomiting,diarrhea or constipation.   Denies dysuria, frequency, hesitancy or incontinence. Denies joint pain, swelling and limitation in mobility. Denies headaches, seizures, numbness, or tingling. Denies depression, does have increased anxiety due to stress of her job, medication currently taking controls her symptoms, she also sees a therapist Denies skin break down or rash.   PE  BP 120/78 (BP Location: Left Arm, Patient Position: Sitting, Cuff Size: Large)   Pulse 62   Resp 12   Ht 4\' 10"  (1.473 m)   Wt 180 lb 1.3 oz (81.7 kg)   LMP 11/17/2015   SpO2 98% Comment: room air  BMI 37.64 kg/m   Patient alert and oriented and in no cardiopulmonary distress.  HEENT: No facial asymmetry, EOMI,   oropharynx pink and moist.  Neck supple no JVD, no mass.Nsal mucosa erythematous and edematous  Chest: Clear to auscultation bilaterally.  CVS: S1, S2 no murmurs, no S3.Regular rate.  ABD: Soft non tender.   Ext: No edema  MS: Adequate ROM spine, shoulders, hips and knees.  Skin: Intact, no ulcerations or rash noted.  Psych: Good eye contact, normal affect. Memory intact not anxious or  depressed appearing.  CNS: CN 2-12 intact, power,  normal throughout.no focal deficits noted.   Assessment & Plan Morbid obesity ([place]) Unchanged obesity associated with anxiety ,insomnia and GERD Patient re-educated about  the importance of commitment to a  minimum of 150 minutes of exercise per week.  The importance of healthy food choices with portion control discussed. Encouraged to start a food diary, count calories and to consider  joining a support group. Sample diet sheets offered. Goals set by the patient for the next several months.   Weight /BMI 10/01/2018 04/30/2018 11/13/2017  WEIGHT 180 lb 1.3 oz 181 lb 0.6 oz 191 lb  HEIGHT 4\' 10"  4\' 11"  4\' 9"   BMI 37.64 kg/m2 36.57 kg/m2 41.33 kg/m2      Insomnia Sleep hygiene reviewed and written information offered also. Prescription sent for  medication needed.   GERD Controlled, no change in medication   Allergic rhinitis Currently experiencing increased symptoms, uncontrolled. Re educated re the need to commit to daily medication and nasal flushes

## 2018-10-05 NOTE — Assessment & Plan Note (Signed)
Controlled, no change in medication  

## 2018-10-05 NOTE — Assessment & Plan Note (Signed)
Sleep hygiene reviewed and written information offered also. Prescription sent for  medication needed.  

## 2018-11-29 ENCOUNTER — Telehealth: Payer: Self-pay | Admitting: *Deleted

## 2018-11-29 NOTE — Telephone Encounter (Signed)
Called pt she is now scheduled with Jarrett Soho tomorrow 3-13 for sick visit

## 2018-11-29 NOTE — Telephone Encounter (Signed)
Needs office visit splease schediule

## 2018-11-29 NOTE — Telephone Encounter (Signed)
Pt called stated she had a sinus infection. She goes through this every year. She had the sinus drainage, runny nose. She wanted something called in like a zpack. She denies fever and coughing. Stated she didn't really feel like coming in.

## 2018-11-30 ENCOUNTER — Encounter: Payer: Self-pay | Admitting: Family Medicine

## 2018-11-30 ENCOUNTER — Other Ambulatory Visit: Payer: Self-pay

## 2018-11-30 ENCOUNTER — Ambulatory Visit: Payer: BC Managed Care – PPO | Admitting: Family Medicine

## 2018-11-30 VITALS — BP 108/80 | HR 87 | Temp 98.4°F | Resp 14 | Ht <= 58 in | Wt 179.0 lb

## 2018-11-30 DIAGNOSIS — J0100 Acute maxillary sinusitis, unspecified: Secondary | ICD-10-CM | POA: Diagnosis not present

## 2018-11-30 DIAGNOSIS — B379 Candidiasis, unspecified: Secondary | ICD-10-CM

## 2018-11-30 MED ORDER — NYSTATIN 100000 UNIT/GM EX POWD: Freq: Two times a day (BID) | CUTANEOUS | 1 refills | Status: DC

## 2018-11-30 MED ORDER — FLUCONAZOLE 150 MG PO TABS: 150.0000 mg | ORAL_TABLET | Freq: Every day | ORAL | 0 refills | Status: AC

## 2018-11-30 MED ORDER — CLOTRIMAZOLE-BETAMETHASONE 1-0.05 % EX CREA: TOPICAL_CREAM | CUTANEOUS | 1 refills | Status: DC

## 2018-11-30 MED ORDER — AMOXICILLIN-POT CLAVULANATE 875-125 MG PO TABS: 1.0000 | ORAL_TABLET | Freq: Two times a day (BID) | ORAL | 0 refills | Status: AC

## 2018-11-30 NOTE — Patient Instructions (Addendum)
    Thank you for coming into the office today. I appreciate the opportunity to provide you with the care for your health and wellness.  Today you were seen for URI/Sinus Infection.  Please take the prescribed medication as directed and complete the full dose to prevent reinfection.  Please eat yogurt or take a probiotic if possible to avoid a yeast infection (this is common) from antibiotic. Should you develop a infection call the office or send a message in [place] to let us know. We will address it quickly.  As suggested for symptom management use Nasal saline spray to help ease congestion and dry nasal passages. Can be used throughout the day as needed.  Avoid forceful blowing of nose. It will be common to have some blood if blowing nose a lot or if nose is very dry.  Tylenol (325 mg 2 tablets every 6 hours) and or Ibuprofen (200- 400 mg every 8 hours) for fever, sore throat and body aches.  If you have a sore throat warm fluids like hot tea or lemon water can help sooth this.   I hope you feel better soon.  Call in 5 days if symptoms persist/worsen and you have completed your full course of medication.   [place] YOUR HANDS WELL AND FREQUENTLY. AVOID TOUCHING YOUR FACE, UNLESS YOUR HANDS ARE FRESHLY WASHED.   GET FRESH AIR DAILY. STAY HYDRATED WITH WATER.   It was a pleasure to see you and I look forward to continuing to work together on your health and well-being. Please do not hesitate to call the office if you need care or have questions about your care.  Have a wonderful day and week.  With Gratitude,  Cherly Beach, DNP, AGNP-BC

## 2018-11-30 NOTE — Progress Notes (Deleted)
Acute Office Visit  Subjective:    Patient ID: [name] [name], female    DOB: 20-Sep-1970, 48 y.o.   MRN: [id number]  Chief Complaint  Patient presents with  . sinus congestion    started Sunday 11/25/2018    HPI Patient is in today for ***  Past Medical History:  Diagnosis Date  . Anxiety   . Bursitis   . Bursitis   . GERD (gastroesophageal reflux disease)   . Hypertension    seen by [facility]  . Palpitations   . PONV (postoperative nausea and vomiting)   . Venous insufficiency     Past Surgical History:  Procedure Laterality Date  . ABDOMINAL HYSTERECTOMY  01/16/2016 [facility])   partial for fibroids  . CESAREAN SECTION    . DILATION AND CURETTAGE OF UTERUS    . HYSTEROSCOPY W/D&C  04/02/2012   Procedure: DILATATION AND CURETTAGE /HYSTEROSCOPY;  Surgeon: Marvene Staff, MD;  Location: [facility];  Service: Gynecology;  Laterality: N/A;  . LAPAROSCOPIC GASTRIC SLEEVE RESECTION WITH HIATAL HERNIA REPAIR N/A 08/15/2016   Procedure: LAPAROSCOPIC GASTRIC SLEEVE RESECTION WITH HIATAL HERNIA REPAIR WITH UPPER ENDO;  Surgeon: Johnathan Hausen, MD;  Location: [facility];  Service: General;  Laterality: N/A;  . LAPAROSCOPIC TUBAL LIGATION  04/02/2012   Procedure: LAPAROSCOPIC TUBAL LIGATION;  Surgeon: Marvene Staff, MD;  Location: [facility];  Service: Gynecology;  Laterality: Bilateral;  . TRANSTHORACIC ECHOCARDIOGRAM  01/11/2009   EF >55%, normal    Family History  Problem Relation Age of Onset  . Cancer Father 67        prostate  . Diabetes Father   . Hypertension Father   . Hypertension Mother   . Hypertension Sister   . Diabetes Sister   . Hypertension Sister   . Diabetes Sister   . Cancer Paternal Aunt 19       breast cancer  . Breast cancer Paternal Aunt 18    Social History   Socioeconomic History  . Marital status: Married    Spouse name: Not on file  . Number of children: Not on file  . Years of education: Not on file  . Highest  education level: Not on file  Occupational History  . Not on file  Social Needs  . Financial resource strain: Not on file  . Food insecurity:    Worry: Not on file    Inability: Not on file  . Transportation needs:    Medical: Not on file    Non-medical: Not on file  Tobacco Use  . Smoking status: Never Smoker  . Smokeless tobacco: Never Used  Substance and Sexual Activity  . Alcohol use: Yes    Comment: rarely  . Drug use: No  . Sexual activity: Yes  Lifestyle  . Physical activity:    Days per week: Not on file    Minutes per session: Not on file  . Stress: Not on file  Relationships  . Social connections:    Talks on phone: Not on file    Gets together: Not on file    Attends religious service: Not on file    Active member of club or organization: Not on file    Attends meetings of clubs or organizations: Not on file    Relationship status: Not on file  . Intimate partner violence:    Fear of current or ex partner: Not on file    Emotionally abused: Not on file    Physically abused: Not  on file    Forced sexual activity: Not on file  Other Topics Concern  . Not on file  Social History Narrative  . Not on file    Outpatient Medications Prior to Visit  Medication Sig Dispense Refill  . acetaminophen (TYLENOL) 500 MG tablet Take 1,000 mg by mouth every 6 (six) hours as needed for moderate pain or headache.    . ALPRAZolam (XANAX) 1 MG tablet Take 1 tablet (1 mg total) by mouth at bedtime. 30 tablet 3  . fluticasone (FLONASE) 50 MCG/ACT nasal spray Place 2 sprays into both nostrils daily. 16 g 6  . loratadine (CLARITIN) 10 MG tablet TAKE 1 TABLET(10 MG) BY MOUTH DAILY 30 tablet 6  . omeprazole (PRILOSEC) 40 MG capsule Take 1 capsule (40 mg total) by mouth daily. 30 capsule 3  . RESTASIS 0.05 % ophthalmic emulsion Place 1 drop into both eyes 2 (two) times daily.     . clotrimazole-betamethasone (LOTRISONE) cream Apply twice daily to itchy rash for 1 week , then as  needed, to affected area(s) 45 g 1  . NYSTATIN powder Apply 1 application topically 2 (two) times daily.     No facility-administered medications prior to visit.     Allergies  Allergen Reactions  . Diltiazem Rash  . Phentermine Palpitations    H/o non sustained v tach    ROS     Objective:    Physical Exam  BP 108/80   Pulse 87   Temp 98.4 F (36.9 C) (Oral)   Resp 14   Ht 4\' 10"  (1.473 m)   Wt 179 lb (81.2 kg)   LMP 11/17/2015   SpO2 97% Comment: room air  BMI 37.41 kg/m  Wt Readings from Last 3 Encounters:  11/30/18 179 lb (81.2 kg)  10/01/18 180 lb 1.3 oz (81.7 kg)  04/30/18 181 lb 0.6 oz (82.1 kg)    There are no preventive care reminders to display for this patient.  There are no preventive care reminders to display for this patient.   Lab Results  Component Value Date   TSH 0.30 (L) 06/29/2017   Lab Results  Component Value Date   WBC 7.5 11/23/2016   HGB 12.4 11/23/2016   HCT 38.2 11/23/2016   MCV 89.9 11/23/2016   PLT 373 11/23/2016   Lab Results  Component Value Date   NA 138 06/29/2017   K 4.2 06/29/2017   CO2 28 06/29/2017   GLUCOSE 81 06/29/2017   BUN 12 06/29/2017   CREATININE 0.75 06/29/2017   BILITOT 0.4 06/29/2017   ALKPHOS 78 08/08/2016   AST 12 06/29/2017   ALT 6 06/29/2017   PROT 6.7 06/29/2017   ALBUMIN 3.9 08/08/2016   CALCIUM 9.0 06/29/2017   ANIONGAP 7 08/08/2016   Lab Results  Component Value Date   CHOL 163 06/29/2017   Lab Results  Component Value Date   HDL 78 06/29/2017   Lab Results  Component Value Date   LDLCALC 70 06/29/2017   Lab Results  Component Value Date   TRIG 71 06/29/2017   Lab Results  Component Value Date   CHOLHDL 2.1 06/29/2017   Lab Results  Component Value Date   HGBA1C 5.7 (H) 08/28/2014       Assessment & Plan:   Problem List Items Addressed This Visit    None       Meds ordered this encounter  Medications  . clotrimazole-betamethasone (LOTRISONE) cream    Sig:  Apply twice daily to  itchy rash for 1 week , then as needed, to affected area(s)    Dispense:  45 g    Refill:  1  . NYSTATIN powder    Sig: Apply topically 2 (two) times daily.    Dispense:  45 g    Refill:  [place], NP

## 2018-11-30 NOTE — Progress Notes (Signed)
11/30/18  Chief Complaint  Patient presents with  . sinus congestion    started Sunday 11/25/2018     Subjective:    [name] [name] is a 48 y.o. female who presents for possible sinus infection. [name] [name] is a 48 year old female patient of Dr. Griffin Dakin.  Who presents today with ongoing sinus congestion, achiness, pressure.  Started on Sunday of this week.  Was trying Benadryl and Tylenol at home without relief.  Nothing is aggravated at this time.  Reports that she does have allergies.  Needs to get refill of Claritin and start using Flonase more.  Reports that the end of the day it is much worse.  Discomfort and pressure is about a 3-4 out of 10 today in the office.  Drainage is green in nature.  Predominantly from her nose.  Denies having true headache.  Denies having ear pain, sore throat, trouble swallowing, chest pain, shortness of breath, cough, wheezing.  Past Medical, Surgical, Social History, Allergies, and Medications have been Reviewed.    Review of Systems  Constitutional: Negative for chills, fever and malaise/fatigue.  HENT: Positive for congestion and sinus pain. Negative for ear discharge, ear pain and sore throat.        Green drainage  Eyes: Negative.   Respiratory: Negative for cough, shortness of breath and wheezing.   Cardiovascular: Negative for chest pain, palpitations and leg swelling.  Gastrointestinal: Negative.   Genitourinary: Negative.   Musculoskeletal: Negative.   Neurological: Positive for headaches. Negative for dizziness. Sensory change:   Psychiatric/Behavioral: Negative.      Objective: Vitals:   11/30/18 0941  BP: 108/80  Pulse: 87  Resp: 14  Temp: 98.4 F (36.9 C)  SpO2: 97%    General appearance: Alert, WD/WN, no distress                             Skin: warm, no rash                           Head: + frontal and maxillary sinus tenderness,                            Eyes: conjunctiva normal, corneas clear, PERRLA             Ears: pearly TMs, external ear canals normal                          Nose: septum midline, turbinates swollen, with erythema and purulent discharge             Mouth/throat:  tongue normal, mild pharyngeal erythema                           Neck: supple, no adenopathy, no thyromegaly, nontender                          Heart: RRR, normal S1, S2, no murmurs                         Lungs: CTA bilaterally, no wheezes, rales, or rhonchi        Assessment and Plan:   1. Acute non-recurrent maxillary sinusitis  - amoxicillin-clavulanate (AUGMENTIN) 875-125 MG tablet; Take 1 tablet by  mouth 2 (two) times daily for 10 days.  Dispense: 20 tablet; Refill: 0  2. Candidiasis  - fluconazole (DIFLUCAN) 150 MG tablet; Take 1 tablet (150 mg total) by mouth daily for 2 days.  Dispense: 2 tablet; Refill: 0   Discussed diagnosis and treatment of sinusitis.  Prescription given for Augmentin.  Suggested symptomatic OTC remedies as needed. Nasal saline spray for congestion and dry nasal passages. Tylenol or Ibuprofen OTC for fever and malaise.  Consider sinus rinses (Neti-pot or Sinus Rinse Kit). Discussed use of Mucinex if needed for congestion. Warm fluids to soothe any throat discomfort. Drink water to stay hydrated. Wash hands well. Rest and Fresh air daily.   Return to clinic if symptoms persist/worsen.  Perlie Mayo NP

## 2019-01-30 ENCOUNTER — Encounter: Payer: Self-pay | Admitting: Family Medicine

## 2019-01-30 ENCOUNTER — Ambulatory Visit (INDEPENDENT_AMBULATORY_CARE_PROVIDER_SITE_OTHER): Payer: BC Managed Care – PPO | Admitting: Family Medicine

## 2019-01-30 VITALS — BP 108/80 | Ht <= 58 in | Wt 179.0 lb

## 2019-01-30 DIAGNOSIS — Z7189 Other specified counseling: Secondary | ICD-10-CM

## 2019-01-30 DIAGNOSIS — J309 Allergic rhinitis, unspecified: Secondary | ICD-10-CM

## 2019-01-30 DIAGNOSIS — F411 Generalized anxiety disorder: Secondary | ICD-10-CM | POA: Diagnosis not present

## 2019-01-30 DIAGNOSIS — F5104 Psychophysiologic insomnia: Secondary | ICD-10-CM | POA: Diagnosis not present

## 2019-01-30 DIAGNOSIS — R7989 Other specified abnormal findings of blood chemistry: Secondary | ICD-10-CM

## 2019-01-30 DIAGNOSIS — E559 Vitamin D deficiency, unspecified: Secondary | ICD-10-CM

## 2019-01-30 DIAGNOSIS — J301 Allergic rhinitis due to pollen: Secondary | ICD-10-CM | POA: Diagnosis not present

## 2019-01-30 MED ORDER — OMEPRAZOLE 40 MG PO CPDR: 40.0000 mg | DELAYED_RELEASE_CAPSULE | Freq: Every day | ORAL | 1 refills | Status: DC

## 2019-01-30 MED ORDER — ALPRAZOLAM 1 MG PO TABS: 1.0000 mg | ORAL_TABLET | Freq: Every day | ORAL | 3 refills | Status: DC

## 2019-01-30 NOTE — Patient Instructions (Signed)
F/u in 4 months in office , call if you nee dm e sooner  Please get fasting labs at [facility] in the next 1 to 2 weeks, CBC, lipid, cmp an dEGFr, TSH and vit D  Please call and schedule your  Gyne appointment and mammogram with Dr. Garwin Brothers  It is important that you exercise regularly at least 30 minutes 5 times a week. If you develop chest pain, have severe difficulty breathing, or feel very tired, stop exercising immediately and seek medical attention    Think about what you will eat, plan ahead. Choose " clean, green, fresh or frozen" over canned, processed or packaged foods which are more sugary, salty and fatty. 70 to 75% of food eaten should be vegetables and fruit. Three meals at set times with snacks allowed between meals, but they must be fruit or vegetables. Aim to eat over a 12 hour period , example 7 am to 7 pm, and STOP after  your last meal of the day. Drink water,generally about 64 ounces per day, no other drink is as healthy. Fruit juice is best enjoyed in a healthy way, by EATING the fruit.  Thanks for choosing [facility], we consider it a privelige to serve you.

## 2019-01-30 NOTE — Progress Notes (Signed)
Virtual Visit via Telephone Note  I connected with [name] [name] on 01/30/19 at  4:00 PM EDT by telephone and verified that I am speaking with the correct person using two identifiers.  Location: Patient:home Provider: office   I discussed the limitations, risks, security and privacy concerns of performing an evaluation and management service by telephone and the availability of in person appointments. I also discussed with the patient that there may be a patient responsible charge related to this service. The patient expressed understanding and agreed to proceed. This visit type is conducted due to national recommendations for restrictions regarding the COVID -19 Pandemic. Due to the patient's age and / or co morbidities, this format is felt to be most appropriate at this time without adequate follow up. The patient has no access to video technology/ had technical difficulties with video, requiring transitioning to audio format  only ( telephone ). All issues noted this document were discussed and addressed,no physical exam can be performed in this format.    History of Present Illness: F/U chronic problems and medication management Increased allergy symptoms as expected at this time Denies recent fever or chills. Denies sinus pressure, nasal congestion, ear pain or sore throat. Denies chest congestion, productive cough or wheezing. Denies chest pains, palpitations and leg swelling Denies abdominal pain, nausea, vomiting,diarrhea or constipation.   Denies dysuria, frequency, hesitancy or incontinence. Denies joint pain, swelling and limitation in mobility. Denies headaches, seizures, numbness, or tingling. Denies depression, increased anxiety due to covid 19, denies insomnia. Denies skin break down or rash. Plans to increase exercise as this is not as consitent as in the past, also eating is off course due ti  Being home bound with increased anxiety, will work on changing both        Observations/Objective: BP 108/80   Ht 4\' 10"  (1.473 m)   Wt 179 lb (81.2 kg)   LMP 11/17/2015   BMI 37.41 kg/m  Good communication with no confusion and intact memory. Alert and oriented x 3 No signs of respiratory distress during sppech    Assessment and Plan:  ANXIETY DISORDER, GENERALIZED Increased currently, no lon ger has access to therapy , but no interest in re establishing at this time Continue bedtime anxiolytic  Allergic rhinitis Increased symptoms during this season, however controlled on medicaiton  Allergic sinusitis Controlled, no change in medication   Insomnia Controlled, no change in medication Sleep hygiene reviewed and written information offered also. Prescription sent for  medication needed.   Morbid obesity ([place]) Unchnaged, encouraged to work on lifestyle to promote weight loss  Patient re-educated about  the importance of commitment to a  minimum of 150 minutes of exercise per week as able.  The importance of healthy food choices with portion control discussed, as well as eating regularly and within a 12 hour window most days. The need to choose "clean , green" food 50 to 75% of the time is discussed, as well as to make water the primary drink and set a goal of 64 ounces water daily.  Encouraged to start a food diary,  and to consider  joining a support group. Sample diet sheets offered. Goals set by the patient for the next several months.   Weight /BMI 01/30/2019 11/30/2018 10/01/2018  WEIGHT 179 lb 179 lb 180 lb 1.3 oz  HEIGHT 4\' 10"  4\' 10"  4\' 10"   BMI 37.41 kg/m2 37.41 kg/m2 37.64 kg/m2      Educated About 2019 Novel Coronavirus Infection Covid-19 Education  The signs and symptoms of of COVID -19 were discussed with the patient and how to seek care for testing. ( follow up with PCP or arrange  E-visit) The importance of social  distancing is discussed today.    Follow Up Instructions:    I discussed the assessment and treatment  plan with the patient. The patient was provided an opportunity to ask questions and all were answered. The patient agreed with the plan and demonstrated an understanding of the instructions.   The patient was advised to call back or seek an in-person evaluation if the symptoms worsen or if the condition fails to improve as anticipated.  I provided 25 minutes of non-face-to-face time during this encounter.   Tula Nakayama, MD

## 2019-02-02 ENCOUNTER — Encounter: Payer: Self-pay | Admitting: Family Medicine

## 2019-02-02 DIAGNOSIS — Z7189 Other specified counseling: Secondary | ICD-10-CM | POA: Insufficient documentation

## 2019-02-02 NOTE — Assessment & Plan Note (Signed)
Unchnaged, encouraged to work on lifestyle to promote weight loss  Patient re-educated about  the importance of commitment to a  minimum of 150 minutes of exercise per week as able.  The importance of healthy food choices with portion control discussed, as well as eating regularly and within a 12 hour window most days. The need to choose "clean , green" food 50 to 75% of the time is discussed, as well as to make water the primary drink and set a goal of 64 ounces water daily.  Encouraged to start a food diary,  and to consider  joining a support group. Sample diet sheets offered. Goals set by the patient for the next several months.   Weight /BMI 01/30/2019 11/30/2018 10/01/2018  WEIGHT 179 lb 179 lb 180 lb 1.3 oz  HEIGHT 4\' 10"  4\' 10"  4\' 10"   BMI 37.41 kg/m2 37.41 kg/m2 37.64 kg/m2

## 2019-02-02 NOTE — Assessment & Plan Note (Signed)
Controlled, no change in medication Sleep hygiene reviewed and written information offered also. Prescription sent for  medication needed.  

## 2019-02-02 NOTE — Assessment & Plan Note (Signed)
Covid-19 Education  The signs and symptoms of of COVID -19 were discussed with the patient and how to seek care for testing. ( follow up with PCP or arrange  E-visit) The importance of social  distancing is discussed today.  

## 2019-02-02 NOTE — Assessment & Plan Note (Signed)
Controlled, no change in medication  

## 2019-02-02 NOTE — Assessment & Plan Note (Signed)
Increased currently, no lon ger has access to therapy , but no interest in re establishing at this time Continue bedtime anxiolytic

## 2019-02-02 NOTE — Assessment & Plan Note (Signed)
Increased symptoms during this season, however controlled on medicaiton

## 2019-04-01 ENCOUNTER — Other Ambulatory Visit: Payer: Self-pay

## 2019-04-01 DIAGNOSIS — Z20822 Contact with and (suspected) exposure to covid-19: Secondary | ICD-10-CM

## 2019-04-05 LAB — NOVEL CORONAVIRUS, NAA: SARS-CoV-2, NAA: NOT DETECTED

## 2019-06-03 ENCOUNTER — Ambulatory Visit: Payer: BC Managed Care – PPO | Admitting: Family Medicine

## 2019-06-04 ENCOUNTER — Ambulatory Visit: Payer: BC Managed Care – PPO | Admitting: Family Medicine

## 2019-06-04 ENCOUNTER — Other Ambulatory Visit: Payer: Self-pay

## 2019-06-04 ENCOUNTER — Encounter: Payer: Self-pay | Admitting: Family Medicine

## 2019-06-04 VITALS — BP 128/84 | HR 81 | Temp 98.2°F | Resp 15 | Ht <= 58 in | Wt 188.0 lb

## 2019-06-04 DIAGNOSIS — F5104 Psychophysiologic insomnia: Secondary | ICD-10-CM

## 2019-06-04 DIAGNOSIS — K219 Gastro-esophageal reflux disease without esophagitis: Secondary | ICD-10-CM | POA: Diagnosis not present

## 2019-06-04 MED ORDER — ALPRAZOLAM 1 MG PO TABS: 1.0000 mg | ORAL_TABLET | Freq: Every day | ORAL | 3 refills | Status: DC

## 2019-06-04 MED ORDER — ALPRAZOLAM 1 MG PO TABS: 1.0000 mg | ORAL_TABLET | Freq: Every day | ORAL | 5 refills | Status: DC

## 2019-06-04 MED ORDER — NALTREXONE-BUPROPION HCL ER 8-90 MG PO TB12: ORAL_TABLET | ORAL | 0 refills | Status: DC

## 2019-06-04 NOTE — Patient Instructions (Addendum)
F/U in 4 months, call I if you need me before  No change with chronic medication  Will send in contrave after visit, see if covered     Please get fasting  labs in the morning , already ordered in the system  Please call for your flu vaccine asap, we have a clinic every Monday by appointment  Thanks for choosing [facility], we consider it a privelige to serve you.

## 2019-06-08 ENCOUNTER — Encounter: Payer: Self-pay | Admitting: Family Medicine

## 2019-06-08 LAB — COMPLETE METABOLIC PANEL WITH GFR
AG Ratio: 1.4 (calc) (ref 1.0–2.5)
ALT: 6 U/L (ref 6–29)
AST: 13 U/L (ref 10–35)
Albumin: 3.8 g/dL (ref 3.6–5.1)
Alkaline phosphatase (APISO): 53 U/L (ref 31–125)
BUN: 12 mg/dL (ref 7–25)
CO2: 23 mmol/L (ref 20–32)
Calcium: 8.7 mg/dL (ref 8.6–10.2)
Chloride: 105 mmol/L (ref 98–110)
Creat: 0.78 mg/dL (ref 0.50–1.10)
GFR, Est African American: 103 mL/min/{1.73_m2} (ref >=60)
GFR, Est Non African American: 89 mL/min/{1.73_m2} (ref >=60)
Globulin: 2.7 g/dL (calc) (ref 1.9–3.7)
Glucose, Bld: 90 mg/dL (ref 65–99)
Potassium: 4.1 mmol/L (ref 3.5–5.3)
Sodium: 137 mmol/L (ref 135–146)
Total Bilirubin: 0.3 mg/dL (ref 0.2–1.2)
Total Protein: 6.5 g/dL (ref 6.1–8.1)

## 2019-06-08 LAB — LIPID PANEL
Cholesterol: 175 mg/dL (ref <200)
HDL: 70 mg/dL (ref >=50)
LDL Cholesterol (Calc): 90 mg/dL (calc)
Non-HDL Cholesterol (Calc): 105 mg/dL (calc) (ref <130)
Total CHOL/HDL Ratio: 2.5 (calc) (ref <5.0)
Triglycerides: 61 mg/dL (ref <150)

## 2019-06-08 LAB — TSH: TSH: 0.43 mIU/L

## 2019-06-08 LAB — CBC
HCT: 39 % (ref 35.0–45.0)
Hemoglobin: 13 g/dL (ref 11.7–15.5)
MCH: 30 pg (ref 27.0–33.0)
MCHC: 33.3 g/dL (ref 32.0–36.0)
MCV: 89.9 fL (ref 80.0–100.0)
MPV: 9.9 fL (ref 7.5–12.5)
Platelets: 324 10*3/uL (ref 140–400)
RBC: 4.34 10*6/uL (ref 3.80–5.10)
RDW: 12.5 % (ref 11.0–15.0)
WBC: 6.3 10*3/uL (ref 3.8–10.8)

## 2019-06-08 LAB — VITAMIN D 25 HYDROXY (VIT D DEFICIENCY, FRACTURES): Vit D, 25-Hydroxy: 36 ng/mL (ref 30–100)

## 2019-06-09 ENCOUNTER — Encounter: Payer: Self-pay | Admitting: Family Medicine

## 2019-06-09 NOTE — Assessment & Plan Note (Signed)
Sleep hygiene reviewed and written information offered also. Prescription sent for  medication needed.  

## 2019-06-09 NOTE — Progress Notes (Signed)
   [name] [name]     MRN: [id number]      DOB: 13-Jan-1971   HPI [name] [name] is here for follow up and re-evaluation of chronic medical conditions, medication management and review of any available recent lab and radiology data.  Preventive health is updated, specifically  Cancer screening and Immunization.   Questions or concerns regarding consultations or procedures which the PT has had in the interim are  addressed. The PT denies any adverse reactions to current medications since the last visit.  There are no new concerns.  There are no specific complaints   ROS Denies recent fever or chills. Denies sinus pressure, nasal congestion, ear pain or sore throat. Denies chest congestion, productive cough or wheezing. Denies chest pains, palpitations and leg swelling Denies abdominal pain, nausea, vomiting,diarrhea or constipation.   Denies dysuria, frequency, hesitancy or incontinence. Denies joint pain, swelling and limitation in mobility. Denies headaches, seizures, numbness, or tingling. Denies depression, anxiety or insomnia. Denies skin break down or rash.   PE  BP 128/84   Pulse 81   Temp 98.2 F (36.8 C) (Temporal)   Resp 15   Ht 4\' 10"  (1.473 m)   Wt 188 lb (85.3 kg)   LMP 11/17/2015   SpO2 98%   BMI 39.29 kg/m   Patient alert and oriented and in no cardiopulmonary distress.  HEENT: No facial asymmetry, EOMI,   oropharynx pink and moist.  Neck supple no JVD, no mass.  Chest: Clear to auscultation bilaterally.  CVS: S1, S2 no murmurs, no S3.Regular rate.  ABD: Soft non tender.   Ext: No edema  MS: Adequate ROM spine, shoulders, hips and knees.  Skin: Intact, no ulcerations or rash noted.  Psych: Good eye contact, normal affect. Memory intact not anxious or depressed appearing.  CNS: CN 2-12 intact, power,  normal throughout.no focal deficits noted.   Assessment & Plan  Morbid obesity [facility])  Patient re-educated about  the importance of commitment to a   minimum of 150 minutes of exercise per week as able.  The importance of healthy food choices with portion control discussed, as well as eating regularly and within a 12 hour window most days. The need to choose "clean , green" food 50 to 75% of the time is discussed, as well as to make water the primary drink and set a goal of 64 ounces water daily.    Weight /BMI 06/04/2019 01/30/2019 11/30/2018  WEIGHT 188 lb 179 lb 179 lb  HEIGHT 4\' 10"  4\' 10"  4\' 10"   BMI 39.29 kg/m2 37.41 kg/m2 37.41 kg/m2  start contrave    GERD Controlled, no change in medication   Insomnia Sleep hygiene reviewed and written information offered also. Prescription sent for  medication needed.

## 2019-06-09 NOTE — Assessment & Plan Note (Signed)
Controlled, no change in medication  

## 2019-06-09 NOTE — Assessment & Plan Note (Signed)
  Patient re-educated about  the importance of commitment to a  minimum of 150 minutes of exercise per week as able.  The importance of healthy food choices with portion control discussed, as well as eating regularly and within a 12 hour window most days. The need to choose "clean , green" food 50 to 75% of the time is discussed, as well as to make water the primary drink and set a goal of 64 ounces water daily.    Weight /BMI 06/04/2019 01/30/2019 11/30/2018  WEIGHT 188 lb 179 lb 179 lb  HEIGHT 4\' 10"  4\' 10"  4\' 10"   BMI 39.29 kg/m2 37.41 kg/m2 37.41 kg/m2  start contrave

## 2019-07-18 ENCOUNTER — Encounter: Payer: Self-pay | Admitting: Family Medicine

## 2019-08-26 ENCOUNTER — Other Ambulatory Visit: Payer: Self-pay

## 2019-08-26 MED ORDER — NYSTATIN 100000 UNIT/GM EX POWD: Freq: Two times a day (BID) | CUTANEOUS | 1 refills | Status: DC

## 2019-08-26 MED ORDER — OMEPRAZOLE 40 MG PO CPDR: 40.0000 mg | DELAYED_RELEASE_CAPSULE | Freq: Every day | ORAL | 1 refills | Status: DC

## 2019-10-08 ENCOUNTER — Ambulatory Visit: Payer: BC Managed Care – PPO | Admitting: Family Medicine

## 2019-10-09 ENCOUNTER — Ambulatory Visit (INDEPENDENT_AMBULATORY_CARE_PROVIDER_SITE_OTHER): Payer: BC Managed Care – PPO | Admitting: Family Medicine

## 2019-10-09 ENCOUNTER — Encounter: Payer: Self-pay | Admitting: Family Medicine

## 2019-10-09 ENCOUNTER — Other Ambulatory Visit: Payer: Self-pay

## 2019-10-09 VITALS — BP 119/79 | Ht 59.0 in | Wt 185.0 lb

## 2019-10-09 DIAGNOSIS — F5104 Psychophysiologic insomnia: Secondary | ICD-10-CM

## 2019-10-09 DIAGNOSIS — I471 Supraventricular tachycardia: Secondary | ICD-10-CM | POA: Diagnosis not present

## 2019-10-09 DIAGNOSIS — J301 Allergic rhinitis due to pollen: Secondary | ICD-10-CM

## 2019-10-09 DIAGNOSIS — L739 Follicular disorder, unspecified: Secondary | ICD-10-CM

## 2019-10-09 DIAGNOSIS — K219 Gastro-esophageal reflux disease without esophagitis: Secondary | ICD-10-CM

## 2019-10-09 MED ORDER — FLUCONAZOLE 150 MG PO TABS: 150.0000 mg | ORAL_TABLET | Freq: Once | ORAL | 0 refills | Status: AC

## 2019-10-09 MED ORDER — CEPHALEXIN 500 MG PO CAPS: 500.0000 mg | ORAL_CAPSULE | Freq: Two times a day (BID) | ORAL | 0 refills | Status: DC

## 2019-10-09 NOTE — Patient Instructions (Addendum)
F/U IN OFFICE WITH MD IN 2 MONTHS<CALL OIF YOU NEED ME N BEFORE SHORT ANTIBIOTIC COURSE IS PRESCRIBED AS DISCUSSED  SAXENDA WILL BE PRESCRIBED<HOPE  FOR A  TEN POUND WEIGHT LOSS IN THE NEXT # MONTHS Think about what you will eat, plan ahead. Choose " clean, green, fresh or frozen" over canned, processed or packaged foods which are more sugary, salty and fatty. 70 to 75% of food eaten should be vegetables and fruit. Three meals at set times with snacks allowed between meals, but they must be fruit or vegetables. Aim to eat over a 12 hour period , example 7 am to 7 pm, and STOP after  your last meal of the day. Drink water,generally about 64 ounces per day, no other drink is as healthy. Fruit juice is best enjoyed in a healthy way, by EATING the fruit.   It is important that you exercise regularly at least 30 minutes 5 times a week. If you develop chest pain, have severe difficulty breathing, or feel very tired, stop exercising immediately and seek medical attention

## 2019-10-16 ENCOUNTER — Encounter: Payer: Self-pay | Admitting: Family Medicine

## 2019-10-17 ENCOUNTER — Other Ambulatory Visit: Payer: Self-pay | Admitting: Family Medicine

## 2019-10-17 MED ORDER — SAXENDA 18 MG/3ML ~~LOC~~ SOPN: PEN_INJECTOR | SUBCUTANEOUS | 1 refills | Status: DC

## 2019-10-20 ENCOUNTER — Encounter: Payer: Self-pay | Admitting: Family Medicine

## 2019-10-20 DIAGNOSIS — L739 Follicular disorder, unspecified: Secondary | ICD-10-CM | POA: Insufficient documentation

## 2019-10-20 MED ORDER — ALPRAZOLAM 1 MG PO TABS: 1.0000 mg | ORAL_TABLET | Freq: Every day | ORAL | 5 refills | Status: DC

## 2019-10-20 NOTE — Assessment & Plan Note (Signed)
Short course of keflex prescribed

## 2019-10-20 NOTE — Assessment & Plan Note (Signed)
Controlled, no change in medication  

## 2019-10-20 NOTE — Assessment & Plan Note (Signed)
  Patient re-educated about  the importance of commitment to a  minimum of 150 minutes of exercise per week as able.  The importance of healthy food choices with portion control discussed, as well as eating regularly and within a 12 hour window most days. The need to choose "clean , green" food 50 to 75% of the time is discussed, as well as to make water the primary drink and set a goal of 64 ounces water daily.    Weight /BMI 10/09/2019 06/04/2019 01/30/2019  WEIGHT 185 lb 188 lb 179 lb  HEIGHT 4\' 11"  4\' 10"  4\' 10"   BMI 37.37 kg/m2 39.29 kg/m2 37.41 kg/m2

## 2019-10-20 NOTE — Assessment & Plan Note (Signed)
Sleep hygiene reviewed and written information offered also. Prescription sent for  medication needed.  

## 2019-10-20 NOTE — Progress Notes (Signed)
Virtual Visit via Telephone Note  I connected with [name] [name] on 10/20/19 at  3:20 PM EST by telephone and verified that I am speaking with the correct person using two identifiers.  Location: Patient: home Provider: office   I discussed the limitations, risks, security and privacy concerns of performing an evaluation and management service by telephone and the availability of in person appointments. I also discussed with the patient that there may be a patient responsible charge related to this service. The patient expressed understanding and agreed to proceed.   History of Present Illness: F/U chronic problems, medication review, and refill medication when necessary. Review most recent labs and order labs which are due Review preventive health and update with necessary referrals or immunizations as indicated C/o ingrown pubic hair with moild discomfort C/o obesity, requests saxenda to help with weight loss, states she will commit to the exercise and change in diet needed to lose weight Denies recent fever or chills. Denies sinus pressure, nasal congestion, ear pain or sore throat. Denies chest congestion, productive cough or wheezing. Denies chest pains, palpitations and leg swelling Denies abdominal pain, nausea, vomiting,diarrhea or constipation.   Denies dysuria, frequency, hesitancy or incontinence. Denies joint pain, swelling and limitation in mobility. Denies headaches, seizures, numbness, or tingling. Denies depression,  Uncontrolled anxiety or insomnia.        Observations/Objective: BP 119/79   Ht 4\' 11"  (1.499 m)   Wt 185 lb (83.9 kg)   LMP 11/17/2015   BMI 37.37 kg/m  Good communication with no confusion and intact memory. Alert and oriented x 3 No signs of respiratory distress during speech    Assessment and Plan:  Allergic rhinitis Controlled, no change in medication   Morbid obesity ([place])  Patient re-educated about  the importance of commitment to  a  minimum of 150 minutes of exercise per week as able.  The importance of healthy food choices with portion control discussed, as well as eating regularly and within a 12 hour window most days. The need to choose "clean , green" food 50 to 75% of the time is discussed, as well as to make water the primary drink and set a goal of 64 ounces water daily.    Weight /BMI 10/09/2019 06/04/2019 01/30/2019  WEIGHT 185 lb 188 lb 179 lb  HEIGHT 4\' 11"  4\' 10"  4\' 10"   BMI 37.37 kg/m2 39.29 kg/m2 37.41 kg/m2      Paroxysmal atrial tachycardia (HCC) Rate controlled on current regime  GERD Controlled, no change in medication   Folliculitis Short course of keflex prescribed  Insomnia Sleep hygiene reviewed and written information offered also. Prescription sent for  medication needed.    Follow Up Instructions:    I discussed the assessment and treatment plan with the patient. The patient was provided an opportunity to ask questions and all were answered. The patient agreed with the plan and demonstrated an understanding of the instructions.   The patient was advised to call back or seek an in-person evaluation if the symptoms worsen or if the condition fails to improve as anticipated.  I provided 15 minutes of non-face-to-face time during this encounter.   Tula Nakayama, MD

## 2019-10-20 NOTE — Assessment & Plan Note (Signed)
Rate controlled on current regime 

## 2019-10-23 ENCOUNTER — Other Ambulatory Visit: Payer: Self-pay | Admitting: Family Medicine

## 2019-11-07 ENCOUNTER — Encounter: Payer: Self-pay | Admitting: Family Medicine

## 2019-11-11 ENCOUNTER — Other Ambulatory Visit: Payer: Self-pay

## 2019-11-11 MED ORDER — PEN NEEDLES 31G X 8 MM MISC: 1.0000 | 2 refills | Status: DC

## 2019-12-12 ENCOUNTER — Ambulatory Visit: Payer: BC Managed Care – PPO

## 2019-12-12 ENCOUNTER — Encounter: Payer: Self-pay | Admitting: Orthopaedic Surgery

## 2019-12-12 ENCOUNTER — Other Ambulatory Visit: Payer: Self-pay

## 2019-12-12 ENCOUNTER — Ambulatory Visit: Payer: BC Managed Care – PPO | Admitting: Orthopaedic Surgery

## 2019-12-12 VITALS — BP 112/81 | HR 77 | Ht <= 58 in | Wt 184.0 lb

## 2019-12-12 DIAGNOSIS — M25511 Pain in right shoulder: Secondary | ICD-10-CM | POA: Diagnosis not present

## 2019-12-12 NOTE — Patient Instructions (Addendum)
One Aleve twice daily with food for several days. Increase to two twice daily if this does not bother your stomach. It will take 5- 6 days or so for the medication to work.   Use Aspercreme, Biofreeze or Voltaren gel over the counter 2-3 times daily make sure you rub it in well each time you use it.
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
## 2019-12-31 ENCOUNTER — Encounter: Payer: Self-pay | Admitting: Orthopaedic Surgery

## 2019-12-31 ENCOUNTER — Ambulatory Visit (INDEPENDENT_AMBULATORY_CARE_PROVIDER_SITE_OTHER): Payer: BC Managed Care – PPO | Admitting: Orthopaedic Surgery

## 2019-12-31 ENCOUNTER — Other Ambulatory Visit: Payer: Self-pay

## 2019-12-31 VITALS — BP 123/82 | HR 73 | Temp 96.2°F | Ht <= 58 in | Wt 190.0 lb

## 2019-12-31 DIAGNOSIS — M25511 Pain in right shoulder: Secondary | ICD-10-CM

## 2019-12-31 MED ORDER — DICLOFENAC SODIUM 75 MG PO TBEC: 75.0000 mg | DELAYED_RELEASE_TABLET | Freq: Two times a day (BID) | ORAL | 2 refills | Status: DC

## 2019-12-31 NOTE — Progress Notes (Signed)
Patient [name] [name], female DOB:1970-10-21, 49 y.o. [id number]  Chief Complaint  Patient presents with  . Shoulder Pain    R/range is not the best and it aches    HPI  [name] [name] is a 49 y.o. female who has right shoulder pain.  She tried the Aleve and it did not help that much. She did not get the Voltaren Gel. She is a little better but still has pain.  I have told her to stop the Aleve. I will begin diclofenac and she should try the Voltaren Gel.  Continue her exercises at home.   Body mass index is 39.71 kg/m.  ROS  Review of Systems  Constitutional: Positive for activity change.  Musculoskeletal: Positive for arthralgias.    All other systems reviewed and are negative.  The following is a summary of the past history medically, past history surgically, known current medicines, social history and family history.  This information is gathered electronically by the computer from prior information and documentation.  I review this each visit and have found including this information at this point in the chart is beneficial and informative.    Past Medical History:  Diagnosis Date  . Anxiety   . Bursitis   . Bursitis   . GERD (gastroesophageal reflux disease)   . Hypertension    seen by [facility]  . Palpitations   . PONV (postoperative nausea and vomiting)   . Venous insufficiency     Past Surgical History:  Procedure Laterality Date  . ABDOMINAL HYSTERECTOMY  01/16/2016 [facility])   partial for fibroids  . CESAREAN SECTION    . DILATION AND CURETTAGE OF UTERUS    . HYSTEROSCOPY WITH D & C  04/02/2012   Procedure: DILATATION AND CURETTAGE /HYSTEROSCOPY;  Surgeon: Marvene Staff, MD;  Location: [facility];  Service: Gynecology;  Laterality: N/A;  . LAPAROSCOPIC GASTRIC SLEEVE RESECTION WITH HIATAL HERNIA REPAIR N/A 08/15/2016   Procedure: LAPAROSCOPIC GASTRIC SLEEVE RESECTION WITH HIATAL HERNIA REPAIR WITH UPPER ENDO;  Surgeon: Johnathan Hausen, MD;  Location: [facility];  Service: General;  Laterality: N/A;  . LAPAROSCOPIC TUBAL LIGATION  04/02/2012   Procedure: LAPAROSCOPIC TUBAL LIGATION;  Surgeon: Marvene Staff, MD;  Location: [facility];  Service: Gynecology;  Laterality: Bilateral;  . TRANSTHORACIC ECHOCARDIOGRAM  01/11/2009   EF >55%, normal    Family History  Problem Relation Age of Onset  . Cancer Father 7        prostate  . Diabetes Father   . Hypertension Father   . Hypertension Mother   . Hypertension Sister   . Diabetes Sister   . Hypertension Sister   . Diabetes Sister   . Cancer Paternal Aunt 63       breast cancer  . Breast cancer Paternal Aunt 46    Social History Social History   Tobacco Use  . Smoking status: Never Smoker  . Smokeless tobacco: Never Used  Substance Use Topics  . Alcohol use: Yes    Comment: rarely  . Drug use: No    Allergies  Allergen Reactions  . Diltiazem Rash  . Phentermine Palpitations    H/o non sustained v tach    Current Outpatient Medications  Medication Sig Dispense Refill  . acetaminophen (TYLENOL) 500 MG tablet Take 1,000 mg by mouth every 6 (six) hours as needed for moderate pain or headache.    . ALPRAZolam (XANAX) 1 MG tablet Take 1 tablet (1 mg total) by mouth at bedtime.  30 tablet 3  . ALPRAZolam (XANAX) 1 MG tablet Take 1 tablet (1 mg total) by mouth at bedtime. 30 tablet 5  . clotrimazole-betamethasone (LOTRISONE) cream Apply twice daily to itchy rash for 1 week , then as needed, to affected area(s) 45 g 1  . fluticasone (FLONASE) 50 MCG/ACT nasal spray SHAKE LIQUID AND USE 2 SPRAYS IN EACH NOSTRIL DAILY 16 g 6  . Insulin Pen Needle (PEN NEEDLES) 31G X 8 MM MISC 1 each by Does not apply route as directed. 50 each 2  . loratadine (CLARITIN) 10 MG tablet TAKE 1 TABLET(10 MG) BY MOUTH DAILY 30 tablet 6  . NYSTATIN powder Apply topically 2 (two) times daily. 45 g 1  . omeprazole (PRILOSEC) 40 MG capsule Take 1 capsule (40 mg total) by mouth daily. 90  capsule 1  . RESTASIS 0.05 % ophthalmic emulsion Place 1 drop into both eyes 2 (two) times daily.     . diclofenac (VOLTAREN) 75 MG EC tablet Take 1 tablet (75 mg total) by mouth 2 (two) times daily with a meal. 60 tablet 2  . Liraglutide -Weight Management (SAXENDA) 18 MG/3ML SOPN Inject 0.6 mg into the skin daily for 7 days, THEN 0.6 mg daily for 7 days. Inject 0.6 mg under the skin once daily for 1 week, then inject 1.2 mg once daily under the skin for 1 week, then inject 1.8 mg under the skin once daily for an additional 6 weeks. 6 mL 1   No current facility-administered medications for this visit.     Physical Exam  Blood pressure 123/82, pulse 73, temperature (!) 96.2 F (35.7 C), height 4\' 10"  (1.473 m), weight 190 lb (86.2 kg), last menstrual period 11/17/2015.  Constitutional: overall normal hygiene, normal nutrition, well developed, normal grooming, normal body habitus. Assistive device:none  Musculoskeletal: gait and station Limp none, muscle tone and strength are normal, no tremors or atrophy is present.  .  Neurological: coordination overall normal.  Deep tendon reflex/nerve stretch intact.  Sensation normal.  Cranial nerves II-XII intact.   Skin:   Normal overall no scars, lesions, ulcers or rashes. No psoriasis.  Psychiatric: Alert and oriented x 3.  Recent memory intact, remote memory unclear.  Normal mood and affect. Well groomed.  Good eye contact.  Cardiovascular: overall no swelling, no varicosities, no edema bilaterally, normal temperatures of the legs and arms, no clubbing, cyanosis and good capillary refill.  Lymphatic: palpation is normal.  Right shoulder has near full ROM but pain in the extremes.  NV intact. ROM neck is full.  All other systems reviewed and are negative   The patient has been educated about the nature of the problem(s) and counseled on treatment options.  The patient appeared to understand what I have discussed and is in agreement with  it.  Encounter Diagnosis  Name Primary?  . Acute pain of right shoulder Yes    PLAN Call if any problems.  Precautions discussed.   Return to clinic 1 month   Electronically Signed Sanjuana Kava, MD 4/13/20212:42 PM

## 2020-01-07 ENCOUNTER — Ambulatory Visit: Payer: BC Managed Care – PPO | Admitting: Family Medicine

## 2020-01-28 ENCOUNTER — Ambulatory Visit: Payer: BC Managed Care – PPO | Admitting: Orthopaedic Surgery

## 2020-01-28 ENCOUNTER — Encounter: Payer: Self-pay | Admitting: Orthopaedic Surgery

## 2020-02-20 ENCOUNTER — Other Ambulatory Visit: Payer: Self-pay | Admitting: Family Medicine

## 2020-03-11 ENCOUNTER — Encounter (HOSPITAL_COMMUNITY): Payer: Self-pay

## 2020-03-18 ENCOUNTER — Ambulatory Visit (INDEPENDENT_AMBULATORY_CARE_PROVIDER_SITE_OTHER): Payer: BC Managed Care – PPO | Admitting: Family Medicine

## 2020-03-18 ENCOUNTER — Encounter: Payer: Self-pay | Admitting: Family Medicine

## 2020-03-18 ENCOUNTER — Other Ambulatory Visit: Payer: Self-pay

## 2020-03-18 VITALS — BP 117/78 | HR 87 | Temp 98.0°F | Resp 16 | Ht 59.0 in | Wt 190.0 lb

## 2020-03-18 DIAGNOSIS — Z1159 Encounter for screening for other viral diseases: Secondary | ICD-10-CM

## 2020-03-18 DIAGNOSIS — F5104 Psychophysiologic insomnia: Secondary | ICD-10-CM | POA: Diagnosis not present

## 2020-03-18 DIAGNOSIS — J301 Allergic rhinitis due to pollen: Secondary | ICD-10-CM

## 2020-03-18 DIAGNOSIS — R002 Palpitations: Secondary | ICD-10-CM

## 2020-03-18 DIAGNOSIS — E042 Nontoxic multinodular goiter: Secondary | ICD-10-CM

## 2020-03-18 DIAGNOSIS — E559 Vitamin D deficiency, unspecified: Secondary | ICD-10-CM

## 2020-03-18 DIAGNOSIS — K219 Gastro-esophageal reflux disease without esophagitis: Secondary | ICD-10-CM

## 2020-03-18 DIAGNOSIS — Z1211 Encounter for screening for malignant neoplasm of colon: Secondary | ICD-10-CM

## 2020-03-18 MED ORDER — ORLISTAT 120 MG PO CAPS: 120.0000 mg | ORAL_CAPSULE | Freq: Three times a day (TID) | ORAL | 3 refills | Status: DC

## 2020-03-18 NOTE — Patient Instructions (Addendum)
Nurse visit next week for Shingrix No. 1.    MD follow-up in office in 4 months for Shingrix No. 2 and re evaluation of  weight.    Please stop drinking sodas as these have at least 120 cal/can this will help with your weight loss.  Please commit to doing exercise for 30 minutes every day during lunchtime at work.  New to help with weight management is Xenical 1 tablet 1 capsule 3 times daily with meals.    You are referred to Dr. Raman for colonoscopy.  Fasting labs as soon as possible CBC lipid CMP and EGFR TSH vitamin D and hepatitis C screen  Thanks for choosing [facility], we consider it a privelige to serve you.  

## 2020-03-18 NOTE — Progress Notes (Signed)
   [name] [name]     MRN: [id number]      DOB: 1971-06-27   HPI [name] [name] is here for follow up and re-evaluation of chronic medical conditions, medication management and review of any available recent lab and radiology data.  Preventive health is updated, specifically  Cancer screening and Immunization.   Questions or concerns regarding consultations or procedures which the PT has had in the interim are  addressed. Still working on weight loss, no success with saxenda,hd significant nausea with it. Plans to work more consistently on lifestyle change ROS Denies recent fever or chills. Denies sinus pressure, nasal congestion, ear pain or sore throat. Denies chest congestion, productive cough or wheezing. Denies chest pains, palpitations and leg swelling Denies abdominal pain, nausea, vomiting,diarrhea or constipation.   Denies dysuria, frequency, hesitancy or incontinence. Denies joint pain, swelling and limitation in mobility. Denies headaches, seizures, numbness, or tingling. Denies depression, uncontrolled anxiety or insomnia. Denies skin break down or rash.   PE  BP 117/78   Pulse 87   Temp 98 F (36.7 C)   Resp 16   Ht 4\' 11"  (1.499 m)   Wt 190 lb 0.6 oz (86.2 kg)   LMP 11/17/2015   SpO2 98%   BMI 38.38 kg/m   Patient alert and oriented and in no cardiopulmonary distress.  HEENT: No facial asymmetry, EOMI,     Neck supple .  Chest: Clear to auscultation bilaterally.  CVS: S1, S2 no murmurs, no S3.Regular rate.  ABD: Soft non tender.   Ext: No edema  MS: Adequate ROM spine, shoulders, hips and knees.  Skin: Intact, no ulcerations or rash noted.  Psych: Good eye contact, normal affect. Memory intact not anxious or depressed appearing.  CNS: CN 2-12 intact, power,  normal throughout.no focal deficits noted.   Assessment & Plan  Allergic rhinitis Controlled, no change in medication   Morbid obesity ([place])  Patient re-educated about  the importance of  commitment to a  minimum of 150 minutes of exercise per week as able.  The importance of healthy food choices with portion control discussed, as well as eating regularly and within a 12 hour window most days. The need to choose "clean , green" food 50 to 75% of the time is discussed, as well as to make water the primary drink and set a goal of 64 ounces water daily.    Weight /BMI 03/18/2020 12/31/2019 12/12/2019  WEIGHT 190 lb 0.6 oz 190 lb 184 lb  HEIGHT 4\' 11"  4\' 10"  4\' 10"   BMI 38.38 kg/m2 39.71 kg/m2 38.46 kg/m2    Start xenical also  Insomnia Sleep hygiene reviewed and written information offered also. Prescription sent for  medication needed. Continue bedtime xanax  GERD Controlled, no change in medication   Intermittent palpitations Denies any recent episodes  Multinodular goiter No need for f/u per radiology report in 2018

## 2020-03-19 ENCOUNTER — Encounter (INDEPENDENT_AMBULATORY_CARE_PROVIDER_SITE_OTHER): Payer: Self-pay | Admitting: *Deleted

## 2020-03-20 ENCOUNTER — Encounter: Payer: Self-pay | Admitting: Family Medicine

## 2020-03-20 MED ORDER — ALPRAZOLAM 1 MG PO TABS
1.0000 mg | ORAL_TABLET | Freq: Every day | ORAL | 4 refills | Status: DC
Start: 2020-04-04 — End: 2020-07-21

## 2020-03-20 NOTE — Assessment & Plan Note (Signed)
Controlled, no change in medication  

## 2020-03-20 NOTE — Assessment & Plan Note (Signed)
No need for f/u per radiology report in 2018

## 2020-03-20 NOTE — Assessment & Plan Note (Signed)
Sleep hygiene reviewed and written information offered also. Prescription sent for  medication needed. Continue bedtime xanax

## 2020-03-20 NOTE — Assessment & Plan Note (Signed)
Denies any recent episodes

## 2020-03-20 NOTE — Assessment & Plan Note (Signed)
  Patient re-educated about  the importance of commitment to a  minimum of 150 minutes of exercise per week as able.  The importance of healthy food choices with portion control discussed, as well as eating regularly and within a 12 hour window most days. The need to choose "clean , green" food 50 to 75% of the time is discussed, as well as to make water the primary drink and set a goal of 64 ounces water daily.    Weight /BMI 03/18/2020 12/31/2019 12/12/2019  WEIGHT 190 lb 0.6 oz 190 lb 184 lb  HEIGHT 4\' 11"  4\' 10"  4\' 10"   BMI 38.38 kg/m2 39.71 kg/m2 38.46 kg/m2    Start xenical also

## 2020-03-26 ENCOUNTER — Other Ambulatory Visit: Payer: Self-pay

## 2020-03-26 ENCOUNTER — Ambulatory Visit (INDEPENDENT_AMBULATORY_CARE_PROVIDER_SITE_OTHER): Payer: BC Managed Care – PPO | Admitting: *Deleted

## 2020-03-26 DIAGNOSIS — Z23 Encounter for immunization: Secondary | ICD-10-CM | POA: Diagnosis not present

## 2020-06-02 ENCOUNTER — Other Ambulatory Visit: Payer: Self-pay | Admitting: Family Medicine

## 2020-06-25 ENCOUNTER — Ambulatory Visit: Payer: BC Managed Care – PPO

## 2020-07-16 ENCOUNTER — Encounter: Payer: Self-pay | Admitting: Family Medicine

## 2020-07-16 LAB — COMPLETE METABOLIC PANEL WITH GFR
AG Ratio: 1.4 (calc) (ref 1.0–2.5)
ALT: 5 U/L — ABNORMAL LOW (ref 6–29)
AST: 12 U/L (ref 10–35)
Albumin: 3.8 g/dL (ref 3.6–5.1)
Alkaline phosphatase (APISO): 61 U/L (ref 37–153)
BUN: 14 mg/dL (ref 7–25)
CO2: 24 mmol/L (ref 20–32)
Calcium: 8.5 mg/dL — ABNORMAL LOW (ref 8.6–10.4)
Chloride: 105 mmol/L (ref 98–110)
Creat: 0.79 mg/dL (ref 0.50–1.05)
GFR, Est African American: 101 mL/min/{1.73_m2} (ref >=60)
GFR, Est Non African American: 87 mL/min/{1.73_m2} (ref >=60)
Globulin: 2.8 g/dL (calc) (ref 1.9–3.7)
Glucose, Bld: 80 mg/dL (ref 65–99)
Potassium: 4.1 mmol/L (ref 3.5–5.3)
Sodium: 137 mmol/L (ref 135–146)
Total Bilirubin: 0.4 mg/dL (ref 0.2–1.2)
Total Protein: 6.6 g/dL (ref 6.1–8.1)

## 2020-07-16 LAB — VITAMIN D 25 HYDROXY (VIT D DEFICIENCY, FRACTURES): Vit D, 25-Hydroxy: 28 ng/mL — ABNORMAL LOW (ref 30–100)

## 2020-07-16 LAB — LIPID PANEL
Cholesterol: 167 mg/dL (ref <200)
HDL: 65 mg/dL (ref >=50)
LDL Cholesterol (Calc): 85 mg/dL (calc)
Non-HDL Cholesterol (Calc): 102 mg/dL (calc) (ref <130)
Total CHOL/HDL Ratio: 2.6 (calc) (ref <5.0)
Triglycerides: 84 mg/dL (ref <150)

## 2020-07-16 LAB — HEPATITIS C ANTIBODY
Hepatitis C Ab: NONREACTIVE
SIGNAL TO CUT-OFF: 0.01 (ref <1.00)

## 2020-07-16 LAB — CBC
HCT: 38.6 % (ref 35.0–45.0)
Hemoglobin: 12.7 g/dL (ref 11.7–15.5)
MCH: 29.3 pg (ref 27.0–33.0)
MCHC: 32.9 g/dL (ref 32.0–36.0)
MCV: 89.1 fL (ref 80.0–100.0)
MPV: 10 fL (ref 7.5–12.5)
Platelets: 316 10*3/uL (ref 140–400)
RBC: 4.33 10*6/uL (ref 3.80–5.10)
RDW: 12.8 % (ref 11.0–15.0)
WBC: 7.6 10*3/uL (ref 3.8–10.8)

## 2020-07-16 LAB — TSH: TSH: 0.93 mIU/L

## 2020-07-20 ENCOUNTER — Other Ambulatory Visit: Payer: Self-pay

## 2020-07-20 ENCOUNTER — Ambulatory Visit: Payer: BC Managed Care – PPO | Admitting: Family Medicine

## 2020-07-20 ENCOUNTER — Encounter: Payer: Self-pay | Admitting: Family Medicine

## 2020-07-20 VITALS — BP 118/80 | HR 72 | Resp 16 | Ht <= 58 in | Wt 196.0 lb

## 2020-07-20 DIAGNOSIS — F411 Generalized anxiety disorder: Secondary | ICD-10-CM

## 2020-07-20 DIAGNOSIS — F5104 Psychophysiologic insomnia: Secondary | ICD-10-CM

## 2020-07-20 DIAGNOSIS — K219 Gastro-esophageal reflux disease without esophagitis: Secondary | ICD-10-CM | POA: Diagnosis not present

## 2020-07-20 MED ORDER — NYSTATIN 100000 UNIT/GM EX POWD
Freq: Two times a day (BID) | CUTANEOUS | 1 refills | Status: DC
Start: 2020-07-20 — End: 2021-03-09

## 2020-07-20 MED ORDER — ALPRAZOLAM 1 MG PO TABS: 1.0000 mg | ORAL_TABLET | Freq: Every day | ORAL | 5 refills | Status: DC

## 2020-07-20 NOTE — Patient Instructions (Addendum)
F/u in office with mD in 5 months, call if you need me sooner  Nurse visit for shingrix # 2 next week  Please get flu vaccine this month  I do recommend weight loss clinic to help with weight management  It is important that you exercise regularly at least 30 minutes 5 times a week. If you develop chest pain, have severe difficulty breathing, or feel very tired, stop exercising immediately and seek medical attention   Please get colonoscopy as soon as possible and you may also discuss the marginally " low" aLT with the gI Doctor  Add one multivitamin one daily, that has calcium 500 mg in each tablet and start vit d 3, 1000 iU once daily  Think about what you will eat, plan ahead. Choose " clean, green, fresh or frozen" over canned, processed or packaged foods which are more sugary, salty and fatty. 70 to 75% of food eaten should be vegetables and fruit. Three meals at set times with snacks allowed between meals, but they must be fruit or vegetables. Aim to eat over a 12 hour period , example 7 am to 7 pm, and STOP after  your last meal of the day. Drink water,generally about 64 ounces per day, no other drink is as healthy. Fruit juice is best enjoyed in a healthy way, by EATING the fruit.  Best for 2021/2022!! Thanks for choosing [facility], we consider it a privelige to serve you.

## 2020-07-21 ENCOUNTER — Encounter: Payer: Self-pay | Admitting: Family Medicine

## 2020-07-21 NOTE — Assessment & Plan Note (Signed)
  Patient re-educated about  the importance of commitment to a  minimum of 150 minutes of exercise per week as able.  The importance of healthy food choices with portion control discussed, as well as eating regularly and within a 12 hour window most days. The need to choose "clean , green" food 50 to 75% of the time is discussed, as well as to make water the primary drink and set a goal of 64 ounces water daily.    Weight /BMI 07/20/2020 03/18/2020 12/31/2019  WEIGHT 196 lb 190 lb 0.6 oz 190 lb  HEIGHT 4\' 9"  4\' 11"  4\' 10"   BMI 42.41 kg/m2 38.38 kg/m2 39.71 kg/m2    Support weight loss clinic

## 2020-07-21 NOTE — Assessment & Plan Note (Signed)
Controlled, no change in medication  

## 2020-07-21 NOTE — Progress Notes (Signed)
   VENNESA [name]     MRN: [id number]      DOB: October 26, 1970   HPI [name] [name] is here for follow up and re-evaluation of chronic medical conditions, medication management and review of any available recent lab and radiology data.  Preventive health is updated, specifically  Cancer screening and Immunization.declines flu vaccine at visiy also zostavax # 2 which is due, states will get them later   Questions or concerns regarding consultations or procedures which the PT has had in the interim are  Addressed.Gyne recommended weight loss clinic to help with obesity, despite gastric sleeve , weight regain continues, no commitment to daily exercise and control of diet. I encourage jer going to the clinic and commitment to weight loss The PT denies any adverse reactions to current medications since the last visit.  ROS Denies recent fever or chills. Denies sinus pressure, nasal congestion, ear pain or sore throat. Denies chest congestion, productive cough or wheezing. Denies chest pains, palpitations and leg swelling Denies abdominal pain, nausea, vomiting,diarrhea or constipation.   Denies dysuria, frequency, hesitancy or incontinence. Denies joint pain, swelling and limitation in mobility. Denies headaches, seizures, numbness, or tingling. Denies depression,c/o  anxiety and mild  Insomnia.Xanax manages both Denies skin break down or rash.   PE  BP 118/80   Pulse 72   Resp 16   Ht 4\' 9"  (1.448 m)   Wt 196 lb (88.9 kg)   LMP 11/17/2015   SpO2 99%   BMI 42.41 kg/m   Patient alert and oriented and in no cardiopulmonary distress.  HEENT: No facial asymmetry, EOMI,     Neck supple .  Chest: Clear to auscultation bilaterally.  CVS: S1, S2 no murmurs, no S3.Regular rate.  ABD: Soft non tender.   Ext: No edema  MS: Adequate ROM spine, shoulders, hips and knees.  Skin: Intact, no ulcerations or rash noted.  Psych: Good eye contact, normal affect. Memory intact not anxious or depressed  appearing.  CNS: CN 2-12 intact, power,  normal throughout.no focal deficits noted.   Assessment & Plan  ANXIETY DISORDER, GENERALIZED Controlled, no change in medication   Morbid obesity ([place])  Patient re-educated about  the importance of commitment to a  minimum of 150 minutes of exercise per week as able.  The importance of healthy food choices with portion control discussed, as well as eating regularly and within a 12 hour window most days. The need to choose "clean , green" food 50 to 75% of the time is discussed, as well as to make water the primary drink and set a goal of 64 ounces water daily.    Weight /BMI 07/20/2020 03/18/2020 12/31/2019  WEIGHT 196 lb 190 lb 0.6 oz 190 lb  HEIGHT 4\' 9"  4\' 11"  4\' 10"   BMI 42.41 kg/m2 38.38 kg/m2 39.71 kg/m2    Support weight loss clinic  Insomnia Sleep hygiene reviewed and written information offered also. Prescription sent for  medication needed.   GERD Controlled, no change in medication

## 2020-07-21 NOTE — Assessment & Plan Note (Signed)
Sleep hygiene reviewed and written information offered also. Prescription sent for  medication needed.  

## 2020-08-17 ENCOUNTER — Ambulatory Visit (INDEPENDENT_AMBULATORY_CARE_PROVIDER_SITE_OTHER): Payer: BC Managed Care – PPO

## 2020-08-17 ENCOUNTER — Other Ambulatory Visit: Payer: Self-pay

## 2020-08-17 DIAGNOSIS — Z23 Encounter for immunization: Secondary | ICD-10-CM

## 2020-08-31 ENCOUNTER — Other Ambulatory Visit: Payer: Self-pay | Admitting: Family Medicine

## 2020-12-28 ENCOUNTER — Ambulatory Visit: Payer: BC Managed Care – PPO | Admitting: Family Medicine

## 2020-12-28 ENCOUNTER — Other Ambulatory Visit: Payer: Self-pay | Admitting: Family Medicine

## 2021-01-06 ENCOUNTER — Ambulatory Visit: Payer: BC Managed Care – PPO | Admitting: Family Medicine

## 2021-01-06 ENCOUNTER — Other Ambulatory Visit: Payer: Self-pay

## 2021-01-06 ENCOUNTER — Encounter: Payer: Self-pay | Admitting: Family Medicine

## 2021-01-06 VITALS — BP 140/88 | HR 88 | Temp 98.4°F | Ht <= 58 in | Wt 195.0 lb

## 2021-01-06 DIAGNOSIS — Z1211 Encounter for screening for malignant neoplasm of colon: Secondary | ICD-10-CM | POA: Diagnosis not present

## 2021-01-06 DIAGNOSIS — Z13220 Encounter for screening for lipoid disorders: Secondary | ICD-10-CM

## 2021-01-06 DIAGNOSIS — E559 Vitamin D deficiency, unspecified: Secondary | ICD-10-CM

## 2021-01-06 DIAGNOSIS — Z114 Encounter for screening for human immunodeficiency virus [HIV]: Secondary | ICD-10-CM | POA: Diagnosis not present

## 2021-01-06 DIAGNOSIS — F5104 Psychophysiologic insomnia: Secondary | ICD-10-CM

## 2021-01-06 DIAGNOSIS — J301 Allergic rhinitis due to pollen: Secondary | ICD-10-CM

## 2021-01-06 MED ORDER — ALPRAZOLAM 1 MG PO TABS: 1.0000 mg | ORAL_TABLET | Freq: Every day | ORAL | 5 refills | Status: DC

## 2021-01-06 NOTE — Assessment & Plan Note (Signed)
Sleep hygiene reviewed and written information offered also. Prescription sent for  medication needed.  

## 2021-01-06 NOTE — Patient Instructions (Addendum)
F/U in 5. 5 months, call if you need me before  Fasting CBC, lipid, cmp and EGFr, tSH and vit D 5  Days  Before next visit  Please work consistently I on food choice and eating over a 12 hr period each day  Water goal of 64 oz/ day   Trty to only eat natural sugar as in fruit    Calorie Counting for Weight Loss Calories are units of energy. Your body needs a certain number of calories from food to keep going throughout the day. When you eat or drink more calories than your body needs, your body stores the extra calories mostly as fat. When you eat or drink fewer calories than your body needs, your body burns fat to get the energy it needs. Calorie counting means keeping track of how many calories you eat and drink each day. Calorie counting can be helpful if you need to lose weight. If you eat fewer calories than your body needs, you should lose weight. Ask your health care provider what a healthy weight is for you. For calorie counting to work, you will need to eat the right number of calories each day to lose a healthy amount of weight per week. A dietitian can help you figure out how many calories you need in a day and will suggest ways to reach your calorie goal.  A healthy amount of weight to lose each week is usually 1-2 lb (0.5-0.9 kg). This usually means that your daily calorie intake should be reduced by 500-750 calories.  Eating 1,200-1,500 calories a day can help most women lose weight.  Eating 1,500-1,800 calories a day can help most men lose weight. What do I need to know about calorie counting? Work with your health care provider or dietitian to determine how many calories you should get each day. To meet your daily calorie goal, you will need to:  Find out how many calories are in each food that you would like to eat. Try to do this before you eat.  Decide how much of the food you plan to eat.  Keep a food log. Do this by writing down what you ate and how many calories it  had. To successfully lose weight, it is important to balance calorie counting with a healthy lifestyle that includes regular activity. Where do I find calorie information? The number of calories in a food can be found on a Nutrition Facts label. If a food does not have a Nutrition Facts label, try to look up the calories online or ask your dietitian for help. Remember that calories are listed per serving. If you choose to have more than one serving of a food, you will have to multiply the calories per serving by the number of servings you plan to eat. For example, the label on a package of bread might say that a serving size is 1 slice and that there are 90 calories in a serving. If you eat 1 slice, you will have eaten 90 calories. If you eat 2 slices, you will have eaten 180 calories.   How do I keep a food log? After each time that you eat, record the following in your food log as soon as possible:  What you ate. Be sure to include toppings, sauces, and other extras on the food.  How much you ate. This can be measured in cups, ounces, or number of items.  How many calories were in each food and drink.  The total  number of calories in the food you ate. Keep your food log near you, such as in a pocket-sized notebook or on an app or website on your mobile phone. Some programs will calculate calories for you and show you how many calories you have left to meet your daily goal. What are some portion-control tips?  Know how many calories are in a serving. This will help you know how many servings you can have of a certain food.  Use a measuring cup to measure serving sizes. You could also try weighing out portions on a kitchen scale. With time, you will be able to estimate serving sizes for some foods.  Take time to put servings of different foods on your favorite plates or in your favorite bowls and cups so you know what a serving looks like.  Try not to eat straight from a food's packaging,  such as from a bag or box. Eating straight from the package makes it hard to see how much you are eating and can lead to overeating. Put the amount you would like to eat in a cup or on a plate to make sure you are eating the right portion.  Use smaller plates, glasses, and bowls for smaller portions and to prevent overeating.  Try not to multitask. For example, avoid watching TV or using your computer while eating. If it is time to eat, sit down at a table and enjoy your food. This will help you recognize when you are full. It will also help you be more mindful of what and how much you are eating. What are tips for following this plan? Reading food labels  Check the calorie count compared with the serving size. The serving size may be smaller than what you are used to eating.  Check the source of the calories. Try to choose foods that are high in protein, fiber, and vitamins, and low in saturated fat, trans fat, and sodium. Shopping  Read nutrition labels while you shop. This will help you make healthy decisions about which foods to buy.  Pay attention to nutrition labels for low-fat or fat-free foods. These foods sometimes have the same number of calories or more calories than the full-fat versions. They also often have added sugar, starch, or salt to make up for flavor that was removed with the fat.  Make a grocery list of lower-calorie foods and stick to it. Cooking  Try to cook your favorite foods in a healthier way. For example, try baking instead of frying.  Use low-fat dairy products. Meal planning  Use more fruits and vegetables. One-half of your plate should be fruits and vegetables.  Include lean proteins, such as chicken, [place], and fish. Lifestyle Each week, aim to do one of the following:  150 minutes of moderate exercise, such as walking.  75 minutes of vigorous exercise, such as running. General information  Know how many calories are in the foods you eat most  often. This will help you calculate calorie counts faster.  Find a way of tracking calories that works for you. Get creative. Try different apps or programs if writing down calories does not work for you. What foods should I eat?  Eat nutritious foods. It is better to have a nutritious, high-calorie food, such as an avocado, than a food with few nutrients, such as a bag of potato chips.  Use your calories on foods and drinks that will fill you up and will not leave you hungry soon after eating. ?  Examples of foods that fill you up are nuts and nut butters, vegetables, lean proteins, and high-fiber foods such as whole grains. High-fiber foods are foods with more than 5 g of fiber per serving.  Pay attention to calories in drinks. Low-calorie drinks include water and unsweetened drinks. The items listed above may not be a complete list of foods and beverages you can eat. Contact a dietitian for more information.   What foods should I limit? Limit foods or drinks that are not good sources of vitamins, minerals, or protein or that are high in unhealthy fats. These include:  Candy.  Other sweets.  Sodas, specialty coffee drinks, alcohol, and juice. The items listed above may not be a complete list of foods and beverages you should avoid. Contact a dietitian for more information. How do I count calories when eating out?  Pay attention to portions. Often, portions are much larger when eating out. Try these tips to keep portions smaller: ? Consider sharing a meal instead of getting your own. ? If you get your own meal, eat only half of it. Before you start eating, ask for a container and put half of your meal into it. ? When available, consider ordering smaller portions from the menu instead of full portions.  Pay attention to your food and drink choices. Knowing the way food is cooked and what is included with the meal can help you eat fewer calories. ? If calories are listed on the menu,  choose the lower-calorie options. ? Choose dishes that include vegetables, fruits, whole grains, low-fat dairy products, and lean proteins. ? Choose items that are boiled, broiled, grilled, or steamed. Avoid items that are buttered, battered, fried, or served with cream sauce. Items labeled as crispy are usually fried, unless stated otherwise. ? Choose water, low-fat milk, unsweetened iced tea, or other drinks without added sugar. If you want an alcoholic beverage, choose a lower-calorie option, such as a glass of wine or light beer. ? Ask for dressings, sauces, and syrups on the side. These are usually high in calories, so you should limit the amount you eat. ? If you want a salad, choose a garden salad and ask for grilled meats. Avoid extra toppings such as bacon, cheese, or fried items. Ask for the dressing on the side, or ask for olive oil and vinegar or lemon to use as dressing.  Estimate how many servings of a food you are given. Knowing serving sizes will help you be aware of how much food you are eating at restaurants. Where to find more information  Centers for Disease Control and Prevention: http://www.wolf.info/  [place] Department of Agriculture: http://www.wilson-mendoza.org/ Summary  Calorie counting means keeping track of how many calories you eat and drink each day. If you eat fewer calories than your body needs, you should lose weight.  A healthy amount of weight to lose per week is usually 1-2 lb (0.5-0.9 kg). This usually means reducing your daily calorie intake by 500-750 calories.  The number of calories in a food can be found on a Nutrition Facts label. If a food does not have a Nutrition Facts label, try to look up the calories online or ask your dietitian for help.  Use smaller plates, glasses, and bowls for smaller portions and to prevent overeating.  Use your calories on foods and drinks that will fill you up and not leave you hungry shortly after a meal. This information is not intended to replace  advice given to you by  your health care provider. Make sure you discuss any questions you have with your health care provider. Document Revised: 10/17/2019 Document Reviewed: 10/17/2019 Elsevier Patient Education  2021 [employer].

## 2021-01-06 NOTE — Progress Notes (Signed)
   [name] [name]     MRN: [id number]      DOB: 1971/02/04   HPI [name] [name] is here for follow up and re-evaluation of chronic medical conditions, medication management and review of any available recent lab and radiology data.  Preventive health is updated, specifically  Cancer screening and Immunization.   Questions or concerns regarding consultations or procedures which the PT has had in the interim are  addressed. The PT denies any adverse reactions to current medications since the last visit.  C/o weight gain and plans to change    ROS Denies recent fever or chills. Denies sinus pressure, nasal congestion, ear pain or sore throat. Denies chest congestion, productive cough or wheezing. Denies chest pains, palpitations and leg swelling Denies abdominal pain, nausea, vomiting,diarrhea or constipation.   Denies dysuria, frequency, hesitancy or incontinence. Denies joint pain, swelling and limitation in mobility. Denies headaches, seizures, numbness, or tingling. Denies depression, anxiety or insomnia. Denies skin break down or rash.   PE  BP 140/88 (BP Location: Right Arm, Patient Position: Sitting, Cuff Size: Normal)   Pulse 88   Temp 98.4 F (36.9 C) (Temporal)   Ht 4\' 10"  (1.473 m)   Wt 195 lb (88.5 kg)   LMP 11/17/2015   SpO2 98%   BMI 40.76 kg/m   Patient alert and oriented and in no cardiopulmonary distress.  HEENT: No facial asymmetry, EOMI,     Neck supple .  Chest: Clear to auscultation bilaterally.  CVS: S1, S2 no murmurs, no S3.Regular rate.  ABD: Soft non tender.   Ext: No edema  MS: Adequate ROM spine, shoulders, hips and knees.  Skin: Intact, no ulcerations or rash noted.  Psych: Good eye contact, normal affect. Memory intact not anxious or depressed appearing.  CNS: CN 2-12 intact, power,  normal throughout.no focal deficits noted.   Assessment & Plan  Morbid obesity [facility])  Patient re-educated about  the importance of commitment to a  minimum of  150 minutes of exercise per week as able.  The importance of healthy food choices with portion control discussed, as well as eating regularly and within a 12 hour window most days. The need to choose "clean , green" food 50 to 75% of the time is discussed, as well as to make water the primary drink and set a goal of 64 ounces water daily.    Weight /BMI 01/06/2021 07/20/2020 03/18/2020  WEIGHT 195 lb 196 lb 190 lb 0.6 oz  HEIGHT 4\' 10"  4\' 9"  4\' 11"   BMI 40.76 kg/m2 42.41 kg/m2 38.38 kg/m2      Vitamin D deficiency Updated lab needed at/ before next visit.   Insomnia Sleep hygiene reviewed and written information offered also. Prescription sent for  medication needed.   Allergic rhinitis No current flare

## 2021-01-06 NOTE — Assessment & Plan Note (Signed)
Updated lab needed at/ before next visit.   

## 2021-01-06 NOTE — Assessment & Plan Note (Signed)
No current flare 

## 2021-01-06 NOTE — Assessment & Plan Note (Signed)
  Patient re-educated about  the importance of commitment to a  minimum of 150 minutes of exercise per week as able.  The importance of healthy food choices with portion control discussed, as well as eating regularly and within a 12 hour window most days. The need to choose "clean , green" food 50 to 75% of the time is discussed, as well as to make water the primary drink and set a goal of 64 ounces water daily.    Weight /BMI 01/06/2021 07/20/2020 03/18/2020  WEIGHT 195 lb 196 lb 190 lb 0.6 oz  HEIGHT 4\' 10"  4\' 9"  4\' 11"   BMI 40.76 kg/m2 42.41 kg/m2 38.38 kg/m2

## 2021-01-14 ENCOUNTER — Telehealth (INDEPENDENT_AMBULATORY_CARE_PROVIDER_SITE_OTHER): Payer: Self-pay | Admitting: *Deleted

## 2021-01-14 ENCOUNTER — Other Ambulatory Visit (INDEPENDENT_AMBULATORY_CARE_PROVIDER_SITE_OTHER): Payer: Self-pay | Admitting: *Deleted

## 2021-01-14 ENCOUNTER — Encounter (INDEPENDENT_AMBULATORY_CARE_PROVIDER_SITE_OTHER): Payer: Self-pay | Admitting: *Deleted

## 2021-01-14 ENCOUNTER — Other Ambulatory Visit (INDEPENDENT_AMBULATORY_CARE_PROVIDER_SITE_OTHER): Payer: Self-pay

## 2021-01-14 DIAGNOSIS — Z1211 Encounter for screening for malignant neoplasm of colon: Secondary | ICD-10-CM

## 2021-01-14 MED ORDER — PEG 3350-KCL-NA BICARB-NACL 420 G PO SOLR: 4000.0000 mL | Freq: Once | ORAL | 0 refills | Status: AC

## 2021-01-14 NOTE — Telephone Encounter (Signed)
Patient needs trilyte 

## 2021-01-14 NOTE — Telephone Encounter (Signed)
Referring MD/PCP: simpson  Procedure: tcs w propofol  Reason/Indication:  screening  Has patient had this procedure before?  no  If so, when, by whom and where?    Is there a family history of colon cancer?  Yes, grandmother  Who?  What age when diagnosed?    Is patient diabetic? If yes, Type 1 or Type 2   no      Does patient have prosthetic heart valve or mechanical valve?  no  Do you have a pacemaker/defibrillator?  no  Has patient ever had endocarditis/atrial fibrillation? no  Have you had a stroke/heart attack last 6 mths? no  Does patient use oxygen? no  Has patient had joint replacement within last 12 months?  no  Is patient constipated or do they take laxatives? no  Does patient have a history of alcohol/drug use?  no  Is patient on blood thinner such as Coumadin, Plavix and/or Aspirin? no  Do you take medicine for weight loss?  no  For female patients,: do you still have your menstrual cycle?   Medications: restasis daily, prilosec 40 mg dialy, xanax 1 mg daily  Allergies: diltiazem, phentermine  Medication Adjustment per Dr Rehman/Dr Jenetta Downer   Procedure date & time: 02/10/21

## 2021-01-14 NOTE — Telephone Encounter (Signed)
Done

## 2021-01-26 ENCOUNTER — Telehealth: Payer: Self-pay | Admitting: Orthopaedic Surgery

## 2021-01-27 ENCOUNTER — Encounter (INDEPENDENT_AMBULATORY_CARE_PROVIDER_SITE_OTHER): Payer: Self-pay

## 2021-01-27 ENCOUNTER — Telehealth (INDEPENDENT_AMBULATORY_CARE_PROVIDER_SITE_OTHER): Payer: Self-pay

## 2021-01-27 MED ORDER — SUTAB 1479-225-188 MG PO TABS: 1.0000 | ORAL_TABLET | Freq: Once | ORAL | 0 refills | Status: AC

## 2021-01-27 NOTE — Telephone Encounter (Signed)
LeighAnn Nasim Habeeb, CMA  

## 2021-02-04 NOTE — Patient Instructions (Signed)
THAIS SILBERSTEIN  02/04/2021     @PREFPERIOPPHARMACY @   Your procedure is scheduled on  02/10/2021   Report to [facility] at  [place]   Call this number if you have problems the morning of surgery:  [phone]   Remember:  Follow the diet and pre instructions given to you by the office.                      Take these medicines the morning of surgery with A SIP OF WATER  Diclofrnac, omeprazole.     Please brush your teeth.  Do not wear jewelry, make-up or nail polish.  Do not wear lotions, powders, or perfumes, or deodorant.  Do not shave 48 hours prior to surgery.  Men may shave face and neck.  Do not bring valuables to the hospital.  [facility] is not responsible for any belongings or valuables.  Contacts, dentures or bridgework may not be worn into surgery.  Leave your suitcase in the car.  After surgery it may be brought to your room.  For patients admitted to the hospital, discharge time will be determined by your treatment team.  Patients discharged the day of surgery will not be allowed to drive home and must have someone with them for 24 hours.   Special instructions:  DO NOT smoke tobacco or vape for 24 hours before your procedure.  Please read over the following fact sheets that you were given. Anesthesia Post-op Instructions and Care and Recovery After Surgery       Colonoscopy, Adult, Care After This sheet gives you information about how to care for yourself after your procedure. Your health care provider may also give you more specific instructions. If you have problems or questions, contact your health care provider. What can I expect after the procedure? After the procedure, it is common to have:  A small amount of blood in your stool for 24 hours after the procedure.  Some gas.  Mild cramping or bloating of your abdomen. Follow these instructions at home: Eating and drinking  Drink enough fluid to keep your urine pale yellow.  Follow  instructions from your health care provider about eating or drinking restrictions.  Resume your normal diet as instructed by your health care provider. Avoid heavy or fried foods that are hard to digest.   Activity  Rest as told by your health care provider.  Avoid sitting for a long time without moving. Get up to take short walks every 1-2 hours. This is important to improve blood flow and breathing. Ask for help if you feel weak or unsteady.  Return to your normal activities as told by your health care provider. Ask your health care provider what activities are safe for you. Managing cramping and bloating  Try walking around when you have cramps or feel bloated.  Apply heat to your abdomen as told by your health care provider. Use the heat source that your health care provider recommends, such as a moist heat pack or a heating pad. ? Place a towel between your skin and the heat source. ? Leave the heat on for 20-30 minutes. ? Remove the heat if your skin turns bright red. This is especially important if you are unable to feel pain, heat, or cold. You may have a greater risk of getting burned.   General instructions  If you were given a sedative during the procedure, it can affect  you for several hours. Do not drive or operate machinery until your health care provider says that it is safe.  For the first 24 hours after the procedure: ? Do not sign important documents. ? Do not drink alcohol. ? Do your regular daily activities at a slower pace than normal. ? Eat soft foods that are easy to digest.  Take over-the-counter and prescription medicines only as told by your health care provider.  Keep all follow-up visits as told by your health care provider. This is important. Contact a health care provider if:  You have blood in your stool 2-3 days after the procedure. Get help right away if you have:  More than a small spotting of blood in your stool.  Large blood clots in your  stool.  Swelling of your abdomen.  Nausea or vomiting.  A fever.  Increasing pain in your abdomen that is not relieved with medicine. Summary  After the procedure, it is common to have a small amount of blood in your stool. You may also have mild cramping and bloating of your abdomen.  If you were given a sedative during the procedure, it can affect you for several hours. Do not drive or operate machinery until your health care provider says that it is safe.  Get help right away if you have a lot of blood in your stool, nausea or vomiting, a fever, or increased pain in your abdomen. This information is not intended to replace advice given to you by your health care provider. Make sure you discuss any questions you have with your health care provider. Document Revised: 08/30/2019 Document Reviewed: 04/01/2019 Elsevier Patient Education  2021 [place] After This sheet gives you information about how to care for yourself after your procedure. Your health care provider may also give you more specific instructions. If you have problems or questions, contact your health care provider. What can I expect after the procedure? After the procedure, it is common to have:  Tiredness.  Forgetfulness about what happened after the procedure.  Impaired judgment for important decisions.  Nausea or vomiting.  Some difficulty with balance. Follow these instructions at home: For the time period you were told by your health care provider:  Rest as needed.  Do not participate in activities where you could fall or become injured.  Do not drive or use machinery.  Do not drink alcohol.  Do not take sleeping pills or medicines that cause drowsiness.  Do not make important decisions or sign legal documents.  Do not take care of children on your own.      Eating and drinking  Follow the diet that is recommended by your health care provider.  Drink  enough fluid to keep your urine pale yellow.  If you vomit: ? Drink water, juice, or soup when you can drink without vomiting. ? Make sure you have little or no nausea before eating solid foods. General instructions  Have a responsible adult stay with you for the time you are told. It is important to have someone help care for you until you are awake and alert.  Take over-the-counter and prescription medicines only as told by your health care provider.  If you have sleep apnea, surgery and certain medicines can increase your risk for breathing problems. Follow instructions from your health care provider about wearing your sleep device: ? Anytime you are sleeping, including during daytime naps. ? While taking prescription pain medicines, sleeping medicines, or medicines  that make you drowsy.  Avoid smoking.  Keep all follow-up visits as told by your health care provider. This is important. Contact a health care provider if:  You keep feeling nauseous or you keep vomiting.  You feel light-headed.  You are still sleepy or having trouble with balance after 24 hours.  You develop a rash.  You have a fever.  You have redness or swelling around the IV site. Get help right away if:  You have trouble breathing.  You have new-onset confusion at home. Summary  For several hours after your procedure, you may feel tired. You may also be forgetful and have poor judgment.  Have a responsible adult stay with you for the time you are told. It is important to have someone help care for you until you are awake and alert.  Rest as told. Do not drive or operate machinery. Do not drink alcohol or take sleeping pills.  Get help right away if you have trouble breathing, or if you suddenly become confused. This information is not intended to replace advice given to you by your health care provider. Make sure you discuss any questions you have with your health care provider. Document Revised:  05/21/2020 Document Reviewed: 08/08/2019 Elsevier Patient Education  2021 [employer].

## 2021-02-08 ENCOUNTER — Encounter (HOSPITAL_COMMUNITY)
Admission: RE | Admit: 2021-02-08 | Discharge: 2021-02-08 | Disposition: A | Discharge status: Home / Self Care | Payer: BC Managed Care – PPO | Source: Ambulatory Visit | Attending: Internal Medicine | Admitting: Internal Medicine

## 2021-02-08 ENCOUNTER — Other Ambulatory Visit: Payer: Self-pay

## 2021-02-08 ENCOUNTER — Other Ambulatory Visit (HOSPITAL_COMMUNITY)
Admission: RE | Admit: 2021-02-08 | Discharge: 2021-02-08 | Disposition: A | Discharge status: Home / Self Care | Payer: BC Managed Care – PPO | Source: Ambulatory Visit | Attending: Internal Medicine | Admitting: Internal Medicine

## 2021-02-08 DIAGNOSIS — Z01818 Encounter for other preprocedural examination: Secondary | ICD-10-CM | POA: Insufficient documentation

## 2021-02-08 DIAGNOSIS — Z6841 Body Mass Index (BMI) 40.0 and over, adult: Secondary | ICD-10-CM | POA: Diagnosis not present

## 2021-02-08 DIAGNOSIS — Z79899 Other long term (current) drug therapy: Secondary | ICD-10-CM | POA: Diagnosis not present

## 2021-02-08 DIAGNOSIS — K5900 Constipation, unspecified: Secondary | ICD-10-CM | POA: Diagnosis not present

## 2021-02-08 DIAGNOSIS — Z1211 Encounter for screening for malignant neoplasm of colon: Secondary | ICD-10-CM | POA: Diagnosis not present

## 2021-02-08 DIAGNOSIS — Z20822 Contact with and (suspected) exposure to covid-19: Secondary | ICD-10-CM | POA: Insufficient documentation

## 2021-02-08 DIAGNOSIS — Z9884 Bariatric surgery status: Secondary | ICD-10-CM | POA: Diagnosis not present

## 2021-02-08 DIAGNOSIS — Z800 Family history of malignant neoplasm of digestive organs: Secondary | ICD-10-CM | POA: Diagnosis not present

## 2021-02-08 DIAGNOSIS — Z888 Allergy status to other drugs, medicaments and biological substances status: Secondary | ICD-10-CM | POA: Diagnosis not present

## 2021-02-08 DIAGNOSIS — K644 Residual hemorrhoidal skin tags: Secondary | ICD-10-CM | POA: Diagnosis not present

## 2021-02-08 DIAGNOSIS — Z803 Family history of malignant neoplasm of breast: Secondary | ICD-10-CM | POA: Diagnosis not present

## 2021-02-09 LAB — SARS CORONAVIRUS 2 (TAT 6-24 HRS): SARS Coronavirus 2: NEGATIVE

## 2021-02-10 ENCOUNTER — Encounter (HOSPITAL_COMMUNITY)
Admission: RE | Disposition: A | Discharge status: Home / Self Care | Payer: Self-pay | Source: Home / Self Care | Attending: Internal Medicine

## 2021-02-10 ENCOUNTER — Ambulatory Visit (HOSPITAL_COMMUNITY): Payer: BC Managed Care – PPO | Admitting: Anesthesiology

## 2021-02-10 ENCOUNTER — Ambulatory Visit (HOSPITAL_COMMUNITY)
Admission: RE | Admit: 2021-02-10 | Discharge: 2021-02-10 | Disposition: A | Discharge status: Home / Self Care | Payer: BC Managed Care – PPO | Attending: Internal Medicine | Admitting: Internal Medicine

## 2021-02-10 ENCOUNTER — Encounter (HOSPITAL_COMMUNITY): Payer: Self-pay | Admitting: Internal Medicine

## 2021-02-10 ENCOUNTER — Encounter (INDEPENDENT_AMBULATORY_CARE_PROVIDER_SITE_OTHER): Payer: Self-pay | Admitting: *Deleted

## 2021-02-10 DIAGNOSIS — Z888 Allergy status to other drugs, medicaments and biological substances status: Secondary | ICD-10-CM | POA: Insufficient documentation

## 2021-02-10 DIAGNOSIS — Z1211 Encounter for screening for malignant neoplasm of colon: Secondary | ICD-10-CM | POA: Diagnosis not present

## 2021-02-10 DIAGNOSIS — K644 Residual hemorrhoidal skin tags: Secondary | ICD-10-CM | POA: Diagnosis not present

## 2021-02-10 DIAGNOSIS — Z79899 Other long term (current) drug therapy: Secondary | ICD-10-CM | POA: Insufficient documentation

## 2021-02-10 DIAGNOSIS — Z800 Family history of malignant neoplasm of digestive organs: Secondary | ICD-10-CM | POA: Insufficient documentation

## 2021-02-10 DIAGNOSIS — K5900 Constipation, unspecified: Secondary | ICD-10-CM | POA: Insufficient documentation

## 2021-02-10 DIAGNOSIS — Z20822 Contact with and (suspected) exposure to covid-19: Secondary | ICD-10-CM | POA: Insufficient documentation

## 2021-02-10 DIAGNOSIS — Z803 Family history of malignant neoplasm of breast: Secondary | ICD-10-CM | POA: Insufficient documentation

## 2021-02-10 DIAGNOSIS — Z6841 Body Mass Index (BMI) 40.0 and over, adult: Secondary | ICD-10-CM | POA: Insufficient documentation

## 2021-02-10 DIAGNOSIS — Z9884 Bariatric surgery status: Secondary | ICD-10-CM | POA: Insufficient documentation

## 2021-02-10 HISTORY — PX: COLONOSCOPY WITH PROPOFOL: SHX5780

## 2021-02-10 LAB — HM COLONOSCOPY

## 2021-02-10 SURGERY — COLONOSCOPY WITH PROPOFOL
Anesthesia: General

## 2021-02-10 MED ORDER — LACTATED RINGERS IV SOLN: INTRAVENOUS | Status: DC

## 2021-02-10 MED ORDER — PROPOFOL 10 MG/ML IV BOLUS
INTRAVENOUS | Status: DC | PRN
  Administered 2021-02-10: 30 mg via INTRAVENOUS
  Administered 2021-02-10: 40 mg via INTRAVENOUS
  Administered 2021-02-10: 50 mg via INTRAVENOUS
  Administered 2021-02-10: 80 mg via INTRAVENOUS
  Administered 2021-02-10: 100 mg via INTRAVENOUS

## 2021-02-10 NOTE — Progress Notes (Signed)
[name] [name] will be able to return to work on Friday 02/12/2021.  She had a procedure at [facility] today.

## 2021-02-10 NOTE — H&P (Signed)
[name] [name] is an 50 y.o. female.   Chief Complaint: Patient is here for colonoscopy. HPI: Patient is 50 year old African-American female who is here for screening colonoscopy.  Patient states she is prone to constipation.  This is not new.  She denies abdominal pain.  She denies rectal bleeding.  This is patient's first exam. Family history is positive for colon carcinoma in a paternal grandmother who was in her 61s.  No other member of the family has had colon carcinoma.  Past Medical History:  Diagnosis Date  . Anxiety   . Bursitis   . Bursitis   . GERD (gastroesophageal reflux disease)   . Hypertension    seen by [facility]  . Palpitations   . PONV (postoperative nausea and vomiting)   . Venous insufficiency     Past Surgical History:  Procedure Laterality Date  . ABDOMINAL HYSTERECTOMY  01/16/2016 [facility])   partial for fibroids  . CESAREAN SECTION    . DILATION AND CURETTAGE OF UTERUS    . HYSTEROSCOPY WITH D & C  04/02/2012   Procedure: DILATATION AND CURETTAGE /HYSTEROSCOPY;  Surgeon: Marvene Staff, MD;  Location: [facility];  Service: Gynecology;  Laterality: N/A;  . LAPAROSCOPIC GASTRIC SLEEVE RESECTION WITH HIATAL HERNIA REPAIR N/A 08/15/2016   Procedure: LAPAROSCOPIC GASTRIC SLEEVE RESECTION WITH HIATAL HERNIA REPAIR WITH UPPER ENDO;  Surgeon: Johnathan Hausen, MD;  Location: [facility];  Service: General;  Laterality: N/A;  . LAPAROSCOPIC TUBAL LIGATION  04/02/2012   Procedure: LAPAROSCOPIC TUBAL LIGATION;  Surgeon: Marvene Staff, MD;  Location: [facility];  Service: Gynecology;  Laterality: Bilateral;  . TRANSTHORACIC ECHOCARDIOGRAM  01/11/2009   EF >55%, normal    Family History  Problem Relation Age of Onset  . Cancer Father 48        prostate  . Diabetes Father   . Hypertension Father   . Hypertension Mother   . Hypertension Sister   . Diabetes Sister   . Hypertension Sister   . Diabetes Sister   . Cancer Paternal Aunt 92       breast  cancer  . Breast cancer Paternal Aunt 86   Social History:  reports that she has never smoked. She has never used smokeless tobacco. She reports current alcohol use. She reports that she does not use drugs.  Allergies:  Allergies  Allergen Reactions  . Diltiazem Rash  . Phentermine Palpitations    H/o non sustained v tach    Medications Prior to Admission  Medication Sig Dispense Refill  . acetaminophen (TYLENOL) 500 MG tablet Take 1,000 mg by mouth every 6 (six) hours as needed for moderate pain or headache.    . ALPRAZolam (XANAX) 1 MG tablet Take 1 tablet (1 mg total) by mouth at bedtime. 30 tablet 5  . calcium carbonate (OS-CAL - DOSED IN MG OF ELEMENTAL CALCIUM) 1250 (500 Ca) MG tablet Take 1 tablet by mouth daily with breakfast.    . cholecalciferol (VITAMIN D3) 25 MCG (1000 UNIT) tablet Take 1,000 Units by mouth daily.    . diclofenac (VOLTAREN) 75 MG EC tablet TAKE 1 TABLET(75 MG) BY MOUTH TWICE DAILY WITH A MEAL (Patient taking differently: Take 75 mg by mouth 2 (two) times daily as needed for mild pain.) 60 tablet 2  . fluticasone (FLONASE) 50 MCG/ACT nasal spray SHAKE LIQUID AND USE 2 SPRAYS IN EACH NOSTRIL DAILY (Patient taking differently: Place 2 sprays into both nostrils daily.) 16 g 6  . NYSTATIN powder Apply topically  2 (two) times daily. (Patient taking differently: Apply 1 application topically 2 (two) times daily as needed (irritation).) 45 g 1  . omeprazole (PRILOSEC) 40 MG capsule TAKE 1 CAPSULE(40 MG) BY MOUTH DAILY (Patient taking differently: Take 40 mg by mouth daily.) 90 capsule 1  . Propylene Glycol (SYSTANE BALANCE) 0.6 % SOLN Place 1 drop into both eyes daily as needed (dry eyes).    . RESTASIS 0.05 % ophthalmic emulsion Place 1 drop into both eyes 2 (two) times daily.    . clotrimazole-betamethasone (LOTRISONE) cream APPLY EXTERNALLY TO THE AFFECTED AREA TWICE DAILY FOR 1 WEEK FOR ITCHY RASH THEN AS NEEDED, TO THE AFFECTED AREA (Patient taking differently:  Apply 1 application topically 2 (two) times daily as needed (irritation). APPLY EXTERNALLY TO THE AFFECTED AREA TWICE DAILY FOR 1 WEEK FOR ITCHY RASH THEN AS NEEDED, TO THE AFFECTED AREA) 45 g 1    Results for orders placed or performed during the hospital encounter of 02/08/21 (from the past 48 hour(s))  SARS CORONAVIRUS 2 (TAT 6-24 HRS) Nasopharyngeal Nasopharyngeal Swab     Status: None   Collection Time: 02/08/21  2:06 PM   Specimen: Nasopharyngeal Swab  Result Value Ref Range   SARS Coronavirus 2 NEGATIVE NEGATIVE    Comment: (NOTE) SARS-CoV-2 target nucleic acids are NOT DETECTED.  The SARS-CoV-2 RNA is generally detectable in upper and lower respiratory specimens during the acute phase of infection. Negative results do not preclude SARS-CoV-2 infection, do not rule out co-infections with other pathogens, and should not be used as the sole basis for treatment or other patient management decisions. Negative results must be combined with clinical observations, patient history, and epidemiological information. The expected result is Negative.  Fact Sheet for Patients: SugarRoll.be  Fact Sheet for Healthcare Providers: https://www.woods-mathews.com/  This test is not yet approved or cleared by the [place] FDA and  has been authorized for detection and/or diagnosis of SARS-CoV-2 by FDA under an Emergency Use Authorization (EUA). This EUA will remain  in effect (meaning this test can be used) for the duration of the COVID-19 declaration under Se ction 564(b)(1) of the Act, 21 U.S.C. section 360bbb-3(b)(1), unless the authorization is terminated or revoked sooner.  Performed at [facility], [place] [address], [place], [place] [address]    No results found.  Review of Systems  Blood pressure 132/90, pulse 62, temperature 98.3 F (36.8 C), resp. rate 11, last menstrual period 11/17/2015, SpO2 99 %. Physical Exam HENT:      Mouth/Throat:     Mouth: Mucous membranes are moist.     Pharynx: Oropharynx is clear.  Eyes:     General: No scleral icterus.    Conjunctiva/sclera: Conjunctivae normal.  Cardiovascular:     Rate and Rhythm: Normal rate and regular rhythm.     Heart sounds: Normal heart sounds. No murmur heard.   Pulmonary:     Effort: Pulmonary effort is normal.     Breath sounds: Normal breath sounds.  Abdominal:     Comments: Abdomen is full.  She has laparoscopy scars.  On palpation is soft and nontender with organomegaly or masses.  Musculoskeletal:        General: No swelling.     Cervical back: Neck supple.  Lymphadenopathy:     Cervical: No cervical adenopathy.  Skin:    General: Skin is warm and dry.  Neurological:     Mental Status: She is alert.      Assessment/Plan  Average rescreening  colonoscopy. (Family history only significant for CRC in a second-degree relative).  Hildred Laser, MD 02/10/2021, 10:54 AM

## 2021-02-10 NOTE — Op Note (Signed)
[facility] Patient Name: [name] [name] Procedure Date: 02/10/2021 10:35 AM MRN: [id number] Date of Birth: Dec 27, 1970 Attending MD: Hildred Laser , MD CSN: [id number] Age: 50 Admit Type: Outpatient Procedure:                Colonoscopy Indications:              Screening for colorectal malignant neoplasm Providers:                Hildred Laser, MD, Otis Peak B. Sharon Seller, RN, Dereck Leep, Technician Referring MD:             Norwood Levo. Moshe Cipro, MD, Medicines:                Propofol per Anesthesia Complications:            No immediate complications. Estimated Blood Loss:     Estimated blood loss: none. Procedure:                Pre-Anesthesia Assessment:                           - Prior to the procedure, a History and Physical                            was performed, and patient medications and                            allergies were reviewed. The patient's tolerance of                            previous anesthesia was also reviewed. The risks                            and benefits of the procedure and the sedation                            options and risks were discussed with the patient.                            All questions were answered, and informed consent                            was obtained. Prior Anticoagulants: The patient has                            taken no previous anticoagulant or antiplatelet                            agents except for NSAID medication. ASA Grade                            Assessment: II - A patient with mild systemic  disease. After reviewing the risks and benefits,                            the patient was deemed in satisfactory condition to                            undergo the procedure.                           After obtaining informed consent, the colonoscope                            was passed under direct vision. Throughout the                             procedure, the patient's blood pressure, pulse, and                            oxygen saturations were monitored continuously. The                            [id number]-[id number] ([id number]) scope was introduced through                            the anus and advanced to the the cecum, identified                            by appendiceal orifice and ileocecal valve. The                            colonoscopy was performed without difficulty. The                            patient tolerated the procedure well. The quality                            of the bowel preparation was excellent. The                            ileocecal valve, appendiceal orifice, and rectum                            were photographed. Scope In: 11:03:48 AM Scope Out: 11:18:02 AM Scope Withdrawal Time: 0 hours 8 minutes 54 seconds  Total Procedure Duration: 0 hours 14 minutes 14 seconds  Findings:      The perianal and digital rectal examinations were normal.      The colon (entire examined portion) appeared normal.      External hemorrhoids were found during retroflexion. The hemorrhoids       were small. Impression:               - The entire examined colon is normal.                           - External hemorrhoids.                           -  No specimens collected. Moderate Sedation:      Per Anesthesia Care Recommendation:           - Patient has a contact number available for                            emergencies. The signs and symptoms of potential                            delayed complications were discussed with the                            patient. Return to normal activities tomorrow.                            Written discharge instructions were provided to the                            patient.                           - High fiber diet today.                           - Continue present medications.                           - Repeat colonoscopy in 10 years for screening                             purposes. Procedure Code(s):        --- Professional ---                           [phone], Colonoscopy, flexible; diagnostic, including                            collection of specimen(s) by brushing or washing,                            when performed (separate procedure) Diagnosis Code(s):        --- Professional ---                           Z12.11, Encounter for screening for malignant                            neoplasm of colon                           K64.4, Residual hemorrhoidal skin tags CPT copyright 2019 American Medical Association. All rights reserved. The codes documented in this report are preliminary and upon coder review may  be revised to meet current compliance requirements. Hildred Laser, MD Hildred Laser, MD 02/10/2021 11:25:00 AM This report has been signed electronically. Number of Addenda: 0

## 2021-02-10 NOTE — Anesthesia Postprocedure Evaluation (Signed)
Anesthesia Post Note  Patient: [name] [name]  Procedure(s) Performed: COLONOSCOPY WITH PROPOFOL (N/A )  Patient location during evaluation: Phase II Anesthesia Type: General Level of consciousness: awake and alert and oriented Pain management: pain level controlled Vital Signs Assessment: post-procedure vital signs reviewed and stable Respiratory status: spontaneous breathing and respiratory function stable Cardiovascular status: blood pressure returned to baseline and stable Postop Assessment: no apparent nausea or vomiting Anesthetic complications: no   No complications documented.   Last Vitals:  Vitals:   02/10/21 1000 02/10/21 1126  BP: 132/90 118/73  Pulse: 62 68  Resp: 11 18  Temp:  36.5 C  SpO2: 99%     Last Pain:  Vitals:   02/10/21 1126  TempSrc: Oral  PainSc:                  Travious Vanover C Cristalle Rohm

## 2021-02-10 NOTE — Discharge Instructions (Signed)
Resume usual medications as before. High-fiber diet.  Consider eating oatmeal's, raisin brands apple almonds and walnuts as these foods may help you with constipation. No driving for 24 hours. Next colonoscopy in 10 years.   Colonoscopy, Adult, Care After This sheet gives you information about how to care for yourself after your procedure. Your doctor may also give you more specific instructions. If you have problems or questions, call your doctor. What can I expect after the procedure? After the procedure, it is common to have:  A small amount of blood in your poop (stool) for 24 hours.  Some gas.  Mild cramping or bloating in your belly (abdomen). Follow these instructions at home: Eating and drinking  Drink enough fluid to keep your pee (urine) pale yellow.  Follow instructions from your doctor about what you cannot eat or drink.  Return to your normal diet as told by your doctor. Avoid heavy or fried foods that are hard to digest.   Activity  Rest as told by your doctor.  Do not sit for a long time without moving. Get up to take short walks every 1-2 hours. This is important. Ask for help if you feel weak or unsteady.  Return to your normal activities as told by your doctor. Ask your doctor what activities are safe for you. To help cramping and bloating:  Try walking around.  Put heat on your belly as told by your doctor. Use the heat source that your doctor recommends, such as a moist heat pack or a heating pad. ? Put a towel between your skin and the heat source. ? Leave the heat on for 20-30 minutes. ? Remove the heat if your skin turns bright red. This is very important if you are unable to feel pain, heat, or cold. You may have a greater risk of getting burned.   General instructions  If you were given a medicine to help you relax (sedative) during your procedure, it can affect you for many hours. Do not drive or use machinery until your doctor says that it is  safe.  For the first 24 hours after the procedure: ? Do not sign important documents. ? Do not drink alcohol. ? Do your daily activities more slowly than normal. ? Eat foods that are soft and easy to digest.  Take over-the-counter or prescription medicines only as told by your doctor.  Keep all follow-up visits as told by your doctor. This is important. Contact a doctor if:  You have blood in your poop 2-3 days after the procedure. Get help right away if:  You have more than a small amount of blood in your poop.  You see large clumps of tissue (blood clots) in your poop.  Your belly is swollen.  You feel like you may vomit (nauseous).  You vomit.  You have a fever.  You have belly pain that gets worse, and medicine does not help your pain. Summary  After the procedure, it is common to have a small amount of blood in your poop. You may also have mild cramping and bloating in your belly.  If you were given a medicine to help you relax (sedative) during your procedure, it can affect you for many hours. Do not drive or use machinery until your doctor says that it is safe.  Get help right away if you have a lot of blood in your poop, feel like you may vomit, have a fever, or have more belly pain. This information is  not intended to replace advice given to you by your health care provider. Make sure you discuss any questions you have with your health care provider. Document Revised: 07/12/2019 Document Reviewed: 04/01/2019 Elsevier Patient Education  2021 [place] After This sheet gives you information about how to care for yourself after your procedure. Your health care provider may also give you more specific instructions. If you have problems or questions, contact your health care provider. What can I expect after the procedure? After the procedure, it is common to have:  Tiredness.  Forgetfulness about what happened after the  procedure.  Impaired judgment for important decisions.  Nausea or vomiting.  Some difficulty with balance. Follow these instructions at home: For the time period you were told by your health care provider:  Rest as needed.  Do not participate in activities where you could fall or become injured.  Do not drive or use machinery.  Do not drink alcohol.  Do not take sleeping pills or medicines that cause drowsiness.  Do not make important decisions or sign legal documents.  Do not take care of children on your own.      Eating and drinking  Follow the diet that is recommended by your health care provider.  Drink enough fluid to keep your urine pale yellow.  If you vomit: ? Drink water, juice, or soup when you can drink without vomiting. ? Make sure you have little or no nausea before eating solid foods. General instructions  Have a responsible adult stay with you for the time you are told. It is important to have someone help care for you until you are awake and alert.  Take over-the-counter and prescription medicines only as told by your health care provider.  If you have sleep apnea, surgery and certain medicines can increase your risk for breathing problems. Follow instructions from your health care provider about wearing your sleep device: ? Anytime you are sleeping, including during daytime naps. ? While taking prescription pain medicines, sleeping medicines, or medicines that make you drowsy.  Avoid smoking.  Keep all follow-up visits as told by your health care provider. This is important. Contact a health care provider if:  You keep feeling nauseous or you keep vomiting.  You feel light-headed.  You are still sleepy or having trouble with balance after 24 hours.  You develop a rash.  You have a fever.  You have redness or swelling around the IV site. Get help right away if:  You have trouble breathing.  You have new-onset confusion at  home. Summary  For several hours after your procedure, you may feel tired. You may also be forgetful and have poor judgment.  Have a responsible adult stay with you for the time you are told. It is important to have someone help care for you until you are awake and alert.  Rest as told. Do not drive or operate machinery. Do not drink alcohol or take sleeping pills.  Get help right away if you have trouble breathing, or if you suddenly become confused. This information is not intended to replace advice given to you by your health care provider. Make sure you discuss any questions you have with your health care provider. Document Revised: 05/21/2020 Document Reviewed: 08/08/2019 Elsevier Patient Education  2021 [place].    High-Fiber Eating Plan Fiber, also called dietary fiber, is a type of carbohydrate. It is found foods such as fruits, vegetables, whole grains, and beans.  A high-fiber diet can have many health benefits. Your health care provider may recommend a high-fiber diet to help:  Prevent constipation. Fiber can make your bowel movements more regular.  Lower your cholesterol.  Relieve the following conditions: ? Inflammation of veins in the anus (hemorrhoids). ? Inflammation of specific areas of the digestive tract (uncomplicated diverticulosis). ? A problem of the large intestine, also called the colon, that sometimes causes pain and diarrhea (irritable bowel syndrome, or IBS).  Prevent overeating as part of a weight-loss plan.  Prevent heart disease, type 2 diabetes, and certain cancers. What are tips for following this plan? Reading food labels  Check the nutrition facts label on food products for the amount of dietary fiber. Choose foods that have 5 grams of fiber or more per serving.  The goals for recommended daily fiber intake include: ? Men (age 16 or younger): 34-38 g. ? Men (over age 39): 28-34 g. ? Women (age 57 or younger): 25-28 g. ? Women (over age  62): 22-25 g. Your daily fiber goal is _____________ g.   Shopping  Choose whole fruits and vegetables instead of processed forms, such as apple juice or applesauce.  Choose a wide variety of high-fiber foods such as avocados, lentils, oats, and kidney beans.  Read the nutrition facts label of the foods you choose. Be aware of foods with added fiber. These foods often have high sugar and sodium amounts per serving. Cooking  Use whole-grain flour for baking and cooking.  Cook with brown rice instead of white rice. Meal planning  Start the day with a breakfast that is high in fiber, such as a cereal that contains 5 g of fiber or more per serving.  Eat breads and cereals that are made with whole-grain flour instead of refined flour or white flour.  Eat brown rice, bulgur wheat, or millet instead of white rice.  Use beans in place of meat in soups, salads, and pasta dishes.  Be sure that half of the grains you eat each day are whole grains. General information  You can get the recommended daily intake of dietary fiber by: ? Eating a variety of fruits, vegetables, grains, nuts, and beans. ? Taking a fiber supplement if you are not able to take in enough fiber in your diet. It is better to get fiber through food than from a supplement.  Gradually increase how much fiber you consume. If you increase your intake of dietary fiber too quickly, you may have bloating, cramping, or gas.  Drink plenty of water to help you digest fiber.  Choose high-fiber snacks, such as berries, raw vegetables, nuts, and popcorn. What foods should I eat? Fruits Berries. Pears. Apples. Oranges. Avocado. Prunes and raisins. Dried figs. Vegetables Sweet potatoes. Spinach. Kale. Artichokes. Cabbage. Broccoli. Cauliflower. Green peas. Carrots. Squash. Grains Whole-grain breads. Multigrain cereal. Oats and oatmeal. Brown rice. Barley. Bulgur wheat. [place]. Quinoa. Bran muffins. Popcorn. Rye wafer  crackers. Meats and other proteins Navy beans, kidney beans, and pinto beans. Soybeans. Split peas. Lentils. Nuts and seeds. Dairy Fiber-fortified yogurt. Beverages Fiber-fortified soy milk. Fiber-fortified orange juice. Other foods Fiber bars. The items listed above may not be a complete list of recommended foods and beverages. Contact a dietitian for more information. What foods should I avoid? Fruits Fruit juice. Cooked, strained fruit. Vegetables Fried potatoes. Canned vegetables. Well-cooked vegetables. Grains White bread. Pasta made with refined flour. White rice. Meats and other proteins Fatty cuts of meat. Fried chicken or fried fish. Dairy Milk.  Yogurt. Cream cheese. Sour cream. Fats and oils Butters. Beverages Soft drinks. Other foods Cakes and pastries. The items listed above may not be a complete list of foods and beverages to avoid. Talk with your dietitian about what choices are best for you. Summary  Fiber is a type of carbohydrate. It is found in foods such as fruits, vegetables, whole grains, and beans.  A high-fiber diet has many benefits. It can help to prevent constipation, lower blood cholesterol, aid weight loss, and reduce your risk of heart disease, diabetes, and certain cancers.  Increase your intake of fiber gradually. Increasing fiber too quickly may cause cramping, bloating, and gas. Drink plenty of water while you increase the amount of fiber you consume.  The best sources of fiber include whole fruits and vegetables, whole grains, nuts, seeds, and beans. This information is not intended to replace advice given to you by your health care provider. Make sure you discuss any questions you have with your health care provider. Document Revised: 01/09/2020 Document Reviewed: 01/09/2020 Elsevier Patient Education  2021 [employer].

## 2021-02-10 NOTE — Anesthesia Preprocedure Evaluation (Signed)
Anesthesia Evaluation  Patient identified by MRN, date of birth, ID band Patient awake    Reviewed: Allergy & Precautions, NPO status , Patient's Chart, lab work & pertinent test results  History of Anesthesia Complications (+) PONV and history of anesthetic complications  Airway Mallampati: II  TM Distance: >3 FB Neck ROM: Full    Dental  (+) Dental Advisory Given   Pulmonary neg pulmonary ROS,    Pulmonary exam normal breath sounds clear to auscultation       Cardiovascular Exercise Tolerance: Good hypertension, Pt. 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   Endo/Other  Morbid obesity  Renal/GU negative Renal ROS     Musculoskeletal negative musculoskeletal ROS (+)   Abdominal   Peds  Hematology negative hematology ROS (+)   Anesthesia Other Findings   Reproductive/Obstetrics negative OB ROS                             Anesthesia Physical Anesthesia Plan  ASA: II  Anesthesia Plan: General   Post-op Pain Management:    Induction: Intravenous  PONV Risk Score and Plan: Propofol infusion  Airway Management Planned: Nasal Cannula and Natural Airway  Additional Equipment:   Intra-op Plan:   Post-operative Plan:   Informed Consent: I have reviewed the patients History and Physical, chart, labs and discussed the procedure including the risks, benefits and alternatives for the proposed anesthesia with the patient or authorized representative who has indicated his/her understanding and acceptance.     Dental advisory given  Plan Discussed with: CRNA and Surgeon  Anesthesia Plan Comments:         Anesthesia Quick Evaluation

## 2021-02-10 NOTE — Transfer of Care (Signed)
Immediate Anesthesia Transfer of Care Note  Patient: [name] [name]  Procedure(s) Performed: COLONOSCOPY WITH PROPOFOL (N/A )  Patient Location: PACU  Anesthesia Type:MAC  Level of Consciousness: awake, alert  and oriented  Airway & Oxygen Therapy: Patient Spontanous Breathing  Post-op Assessment: Report given to RN and Post -op Vital signs reviewed and stable  Post vital signs: Reviewed and stable  Last Vitals:  Vitals Value Taken Time  BP    Temp    Pulse    Resp    SpO2      Last Pain:  Vitals:   02/10/21 1059  PainSc: 0-No pain         Complications: No complications documented.

## 2021-02-18 ENCOUNTER — Encounter (HOSPITAL_COMMUNITY): Payer: Self-pay | Admitting: Internal Medicine

## 2021-02-26 ENCOUNTER — Other Ambulatory Visit: Payer: Self-pay | Admitting: Family Medicine

## 2021-03-09 ENCOUNTER — Other Ambulatory Visit: Payer: Self-pay

## 2021-03-09 ENCOUNTER — Encounter: Payer: Self-pay | Admitting: Family Medicine

## 2021-03-09 ENCOUNTER — Ambulatory Visit: Payer: BC Managed Care – PPO | Admitting: Family Medicine

## 2021-03-09 VITALS — BP 128/88 | HR 74 | Temp 98.7°F | Resp 18 | Ht <= 58 in | Wt 192.0 lb

## 2021-03-09 DIAGNOSIS — M25471 Effusion, right ankle: Secondary | ICD-10-CM | POA: Diagnosis not present

## 2021-03-09 DIAGNOSIS — K219 Gastro-esophageal reflux disease without esophagitis: Secondary | ICD-10-CM | POA: Diagnosis not present

## 2021-03-09 DIAGNOSIS — F5104 Psychophysiologic insomnia: Secondary | ICD-10-CM

## 2021-03-09 MED ORDER — NYSTATIN 100000 UNIT/GM EX POWD: Freq: Two times a day (BID) | CUTANEOUS | 1 refills | Status: DC

## 2021-03-09 MED ORDER — CLOTRIMAZOLE-BETAMETHASONE 1-0.05 % EX CREA: TOPICAL_CREAM | CUTANEOUS | 1 refills | Status: DC

## 2021-03-09 MED ORDER — POTASSIUM CHLORIDE CRYS ER 20 MEQ PO TBCR: EXTENDED_RELEASE_TABLET | ORAL | 0 refills | Status: DC

## 2021-03-09 MED ORDER — FUROSEMIDE 20 MG PO TABS: ORAL_TABLET | ORAL | 0 refills | Status: DC

## 2021-03-09 NOTE — Assessment & Plan Note (Signed)
Controlled, no change in medication  

## 2021-03-09 NOTE — Assessment & Plan Note (Signed)
Sleep hygiene reviewed and written information offered also. Prescription sent for  medication needed.  

## 2021-03-09 NOTE — Assessment & Plan Note (Signed)
Triggered by footwear with heals, no pain, mild swelling Lasix and potassium x 3 days, then as needed, limited su[pply, 10 days total

## 2021-03-09 NOTE — Progress Notes (Signed)
   [name] [name]     MRN: [id number]      DOB: 1971-09-16   HPI [name] is here for follow up and re-evaluation of chronic medical conditions, medication management and review of any available recent lab and radiology data.  Preventive health is updated, specifically  Cancer screening and Immunization.   Questions or concerns regarding consultations or procedures which the PT has had in the interim are  addressed. The PT denies any adverse reactions to current medications since the last visit.  2 week h/o right ankle swelling, likely aggravated by footwear , denies joint pain  ROS Denies recent fever or chills. Denies sinus pressure, nasal congestion, ear pain or sore throat. Denies chest congestion, productive cough or whe, PND or orthopneaand leg swelling Denies abdominal pain, nausea, vomiting,diarrhea or constipation.   Denies dysuria, frequency, hesitancy or incontinence. Denies joint pain, swelling and limitation in mobility. Denies headaches, seizures, numbness, or tingling. Denies depression, anxiety or insomnia. Denies skin break down or rash.   PE  BP 128/88   Pulse 74   Temp 98.7 F (37.1 C)   Resp 18   Ht 4\' 10"  (1.473 m)   Wt 192 lb (87.1 kg)   LMP 11/17/2015   SpO2 97%   BMI 40.13 kg/m   Patient alert and oriented and in no cardiopulmonary distress.  HEENT: No facial asymmetry, EOMI,     Neck supple .  Chest: Clear to auscultation bilaterally.  CVS: S1, S2 no murmurs, no S3.Regular rate.  ABD: Soft non tender.  Slight swelling of rigth foot and ankleNo edema  MS: Adequate ROM spine, shoulders, hips and knees.and ankles  Skin: Intact, no ulcerations or rash noted.  Psych: Good eye contact, normal affect. Memory intact not anxious or depressed appearing.  CNS: CN 2-12 intact, power,  normal throughout.no focal deficits noted.   Assessment & Plan  Insomnia Sleep hygiene reviewed and written information offered also. Prescription sent for   medication needed.   Morbid obesity ([place])  Patient re-educated about  the importance of commitment to a  minimum of 150 minutes of exercise per week as able.  The importance of healthy food choices with portion control discussed, as well as eating regularly and within a 12 hour window most days. The need to choose "clean , green" food 50 to 75% of the time is discussed, as well as to make water the primary drink and set a goal of 64 ounces water daily.    Weight /BMI 03/09/2021 02/08/2021 01/06/2021  WEIGHT 192 lb 195 lb 195 lb  HEIGHT 4\' 10"  4\' 10"  4\' 10"   BMI 40.13 kg/m2 40.76 kg/m2 40.76 kg/m2      GERD Controlled, no change in medication   Ankle swelling, right Triggered by footwear with heals, no pain, mild swelling Lasix and potassium x 3 days, then as needed, limited su[pply, 10 days total

## 2021-03-09 NOTE — Patient Instructions (Signed)
F/U as before, call if you need me sooner  Call for pneumonia vaccine asap plase  New for ankle swelling is lasix and potassium for 3 days, then only as needed  Nursing please refill,  nystatin and clotrimazole   Fasting labs for October visit  Weight loss goal of 10 to 15 pounds   It is important that you exercise regularly at least 30 minutes 5 times a week. If you develop chest pain, have severe difficulty breathing, or feel very tired, stop exercising immediately and seek medical attention    Think about what you will eat, plan ahead. Choose " clean, green, fresh or frozen" over canned, processed or packaged foods which are more sugary, salty and fatty. 70 to 75% of food eaten should be vegetables and fruit. Three meals at set times with snacks allowed between meals, but they must be fruit or vegetables. Aim to eat over a 12 hour period , example 7 am to 7 pm, and STOP after  your last meal of the day. Drink water,generally about 64 ounces per day, no other drink is as healthy. Fruit juice is best enjoyed in a healthy way, by EATING the fruit. Thanks for choosing [facility], we consider it a privelige to serve you.

## 2021-03-09 NOTE — Assessment & Plan Note (Signed)
  Patient re-educated about  the importance of commitment to a  minimum of 150 minutes of exercise per week as able.  The importance of healthy food choices with portion control discussed, as well as eating regularly and within a 12 hour window most days. The need to choose "clean , green" food 50 to 75% of the time is discussed, as well as to make water the primary drink and set a goal of 64 ounces water daily.    Weight /BMI 03/09/2021 02/08/2021 01/06/2021  WEIGHT 192 lb 195 lb 195 lb  HEIGHT 4\' 10"  4\' 10"  4\' 10"   BMI 40.13 kg/m2 40.76 kg/m2 40.76 kg/m2

## 2021-03-10 ENCOUNTER — Encounter (HOSPITAL_COMMUNITY): Payer: Self-pay | Admitting: *Deleted

## 2021-03-16 ENCOUNTER — Other Ambulatory Visit: Payer: Self-pay | Admitting: Family Medicine

## 2021-03-25 ENCOUNTER — Other Ambulatory Visit: Payer: Self-pay | Admitting: Family Medicine

## 2021-06-23 ENCOUNTER — Ambulatory Visit: Payer: BC Managed Care – PPO | Admitting: Family Medicine

## 2021-06-23 ENCOUNTER — Other Ambulatory Visit: Payer: Self-pay

## 2021-06-23 ENCOUNTER — Encounter: Payer: Self-pay | Admitting: Family Medicine

## 2021-06-23 VITALS — BP 125/84 | HR 79 | Resp 16 | Ht <= 58 in | Wt 194.0 lb

## 2021-06-23 DIAGNOSIS — Z01419 Encounter for gynecological examination (general) (routine) without abnormal findings: Secondary | ICD-10-CM | POA: Diagnosis not present

## 2021-06-23 DIAGNOSIS — F411 Generalized anxiety disorder: Secondary | ICD-10-CM

## 2021-06-23 DIAGNOSIS — K219 Gastro-esophageal reflux disease without esophagitis: Secondary | ICD-10-CM | POA: Diagnosis not present

## 2021-06-23 DIAGNOSIS — R7302 Impaired glucose tolerance (oral): Secondary | ICD-10-CM

## 2021-06-23 DIAGNOSIS — Z13220 Encounter for screening for lipoid disorders: Secondary | ICD-10-CM

## 2021-06-23 DIAGNOSIS — E559 Vitamin D deficiency, unspecified: Secondary | ICD-10-CM

## 2021-06-23 DIAGNOSIS — Z1231 Encounter for screening mammogram for malignant neoplasm of breast: Secondary | ICD-10-CM

## 2021-06-23 DIAGNOSIS — B369 Superficial mycosis, unspecified: Secondary | ICD-10-CM

## 2021-06-23 MED ORDER — CLOTRIMAZOLE-BETAMETHASONE 1-0.05 % EX CREA: TOPICAL_CREAM | CUTANEOUS | 1 refills | Status: DC

## 2021-06-23 NOTE — Patient Instructions (Addendum)
F/u in 4 months, call if you need me before  Reconsider flu vaccine  Please get labs ordered in April in next 1 week, nurse please add HBA1C   Nurse please refill nystop and clotrimazole/ betamethasone  Xanax will be refilled by MD  Please schedule mammogram at [facility] at checkout  You are referred to Dr Garwin Brothers for gyne exam Thanks for choosing [facility], we consider it a privelige to serve you.

## 2021-06-27 ENCOUNTER — Encounter: Payer: Self-pay | Admitting: Family Medicine

## 2021-06-27 MED ORDER — ALPRAZOLAM 1 MG PO TABS: 1.0000 mg | ORAL_TABLET | Freq: Every day | ORAL | 5 refills | Status: DC

## 2021-06-27 NOTE — Progress Notes (Signed)
   [name] [name]     MRN: [id number]      DOB: 07-27-1971   HPI [name] [name] is here for follow up and re-evaluation of chronic medical conditions, medication management and review of any available recent lab and radiology data.  Preventive health is updated, specifically  Cancer screening and Immunization.   Questions or concerns regarding consultations or procedures which the PT has had in the interim are  addressed. The PT denies any adverse reactions to current medications since the last visit.  There are no new concerns.  There are no specific complaints   ROS Denies recent fever or chills. Denies sinus pressure, nasal congestion, ear pain or sore throat. Denies chest congestion, productive cough or wheezing. Denies chest pains, palpitations and leg swelling Denies abdominal pain, nausea, vomiting,diarrhea or constipation.   Denies dysuria, frequency, hesitancy or incontinence. Denies joint pain, swelling and limitation in mobility. Denies headaches, seizures, numbness, or tingling. Denies depression, anxiety or insomnia. Denies skin break down or rash.   PE  BP 125/84   Pulse 79   Resp 16   Ht 4\' 10"  (1.473 m)   Wt 194 lb (88 kg)   LMP 11/17/2015   SpO2 97%   BMI 40.55 kg/m   Patient alert and oriented and in no cardiopulmonary distress.  HEENT: No facial asymmetry, EOMI,     Neck supple .  Chest: Clear to auscultation bilaterally.  CVS: S1, S2 no murmurs, no S3.Regular rate.  ABD: Soft non tender.   Ext: No edema  MS: Adequate ROM spine, shoulders, hips and knees.  Skin: Intact, no ulcerations or rash noted.  Psych: Good eye contact, normal affect. Memory intact not anxious or depressed appearing.  CNS: CN 2-12 intact, power,  normal throughout.no focal deficits noted.   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  The importance of healthy food choices with portion control discussed, as well as eating regularly and within a 12 hour window most days. The need to choose "clean , green" food 50 to 75% of the time is discussed, as well as to make water the primary drink and set a goal of 64 ounces water daily.    Weight /BMI 06/23/2021 03/09/2021 02/08/2021  WEIGHT 194 lb 192 lb 195 lb  HEIGHT 4\' 10"  4\' 10"  4\' 10"   BMI 40.55 kg/m2 40.13 kg/m2 40.76 kg/m2    Currently in a support group which she feels will be benficial  ANXIETY DISORDER, GENERALIZED Controlled, no change in medication

## 2021-06-27 NOTE — Assessment & Plan Note (Signed)
  Patient re-educated about  the importance of commitment to a  minimum of 150 minutes of exercise per week as able.  The importance of healthy food choices with portion control discussed, as well as eating regularly and within a 12 hour window most days. The need to choose "clean , green" food 50 to 75% of the time is discussed, as well as to make water the primary drink and set a goal of 64 ounces water daily.    Weight /BMI 06/23/2021 03/09/2021 02/08/2021  WEIGHT 194 lb 192 lb 195 lb  HEIGHT 4\' 10"  4\' 10"  4\' 10"   BMI 40.55 kg/m2 40.13 kg/m2 40.76 kg/m2    Currently in a support group which she feels will be benficial

## 2021-06-27 NOTE — Assessment & Plan Note (Signed)
Controlled, no change in medication  

## 2021-06-27 NOTE — Assessment & Plan Note (Signed)
Nystop and clotrimazole/ betamethasone prescibed

## 2021-07-01 ENCOUNTER — Ambulatory Visit (HOSPITAL_COMMUNITY): Payer: BC Managed Care – PPO

## 2021-07-08 ENCOUNTER — Inpatient Hospital Stay (HOSPITAL_COMMUNITY): Admission: RE | Admit: 2021-07-08 | Payer: BC Managed Care – PPO | Source: Ambulatory Visit

## 2021-07-21 ENCOUNTER — Inpatient Hospital Stay (HOSPITAL_COMMUNITY): Admission: RE | Admit: 2021-07-21 | Payer: BC Managed Care – PPO | Source: Ambulatory Visit

## 2021-07-28 ENCOUNTER — Other Ambulatory Visit: Payer: Self-pay

## 2021-07-28 ENCOUNTER — Ambulatory Visit (HOSPITAL_COMMUNITY)
Admission: RE | Admit: 2021-07-28 | Discharge: 2021-07-28 | Disposition: A | Discharge status: Home / Self Care | Payer: BC Managed Care – PPO | Source: Ambulatory Visit | Attending: Family Medicine | Admitting: Family Medicine

## 2021-07-28 DIAGNOSIS — Z1231 Encounter for screening mammogram for malignant neoplasm of breast: Secondary | ICD-10-CM | POA: Insufficient documentation

## 2021-08-02 ENCOUNTER — Other Ambulatory Visit: Payer: Self-pay | Admitting: Family Medicine

## 2021-08-23 ENCOUNTER — Encounter: Payer: Self-pay | Admitting: Family Medicine

## 2021-10-27 ENCOUNTER — Ambulatory Visit: Payer: BC Managed Care – PPO | Admitting: Family Medicine

## 2021-11-04 ENCOUNTER — Ambulatory Visit: Payer: BC Managed Care – PPO | Admitting: Family Medicine

## 2021-11-04 ENCOUNTER — Other Ambulatory Visit: Payer: Self-pay

## 2021-11-04 ENCOUNTER — Encounter: Payer: Self-pay | Admitting: Family Medicine

## 2021-11-04 VITALS — BP 122/84 | HR 99 | Ht <= 58 in | Wt 187.0 lb

## 2021-11-04 DIAGNOSIS — F411 Generalized anxiety disorder: Secondary | ICD-10-CM

## 2021-11-04 DIAGNOSIS — Z13220 Encounter for screening for lipoid disorders: Secondary | ICD-10-CM

## 2021-11-04 DIAGNOSIS — G8929 Other chronic pain: Secondary | ICD-10-CM

## 2021-11-04 DIAGNOSIS — F5104 Psychophysiologic insomnia: Secondary | ICD-10-CM

## 2021-11-04 DIAGNOSIS — M25562 Pain in left knee: Secondary | ICD-10-CM

## 2021-11-04 DIAGNOSIS — E042 Nontoxic multinodular goiter: Secondary | ICD-10-CM

## 2021-11-04 DIAGNOSIS — K219 Gastro-esophageal reflux disease without esophagitis: Secondary | ICD-10-CM

## 2021-11-04 DIAGNOSIS — E559 Vitamin D deficiency, unspecified: Secondary | ICD-10-CM

## 2021-11-04 MED ORDER — SEMAGLUTIDE-WEIGHT MANAGEMENT 0.5 MG/0.5ML ~~LOC~~ SOAJ: 0.5000 mg | SUBCUTANEOUS | 1 refills | Status: DC

## 2021-11-04 MED ORDER — ALPRAZOLAM 1 MG PO TABS: 1.0000 mg | ORAL_TABLET | Freq: Every day | ORAL | 5 refills | Status: DC

## 2021-11-04 MED ORDER — WEGOVY 0.25 MG/0.5ML ~~LOC~~ SOAJ: 0.2500 mg | SUBCUTANEOUS | 0 refills | Status: DC

## 2021-11-04 NOTE — Assessment & Plan Note (Signed)
Recent flare of pain and instability x 2 months, refer Ortho

## 2021-11-04 NOTE — Patient Instructions (Addendum)
F/U in 8 weeks, call if you need me before  TdAP at visit NEED fasting CBCV, lipid, cmp and EGFR, TSH and vit D  New is once weekly wegovy for help with weight loss  You are referred to Orthopedics in [place]  Xanax is refille x 6 months  It is important that you exercise regularly at least 30 minutes 5 times a week. If you develop chest pain, have severe difficulty breathing, or feel very tired, stop exercising immediately and seek medical attention   Weight loss goal of 8 to 12 pounds  Thanks for choosing [facility], we consider it a privelige to serve you.

## 2021-11-06 LAB — LIPID PANEL
Chol/HDL Ratio: 2.4 ratio (ref 0.0–4.4)
Cholesterol, Total: 181 mg/dL (ref 100–199)
HDL: 76 mg/dL (ref >39)
LDL Chol Calc (NIH): 92 mg/dL (ref 0–99)
Triglycerides: 70 mg/dL (ref 0–149)
VLDL Cholesterol Cal: 13 mg/dL (ref 5–40)

## 2021-11-06 LAB — TSH: TSH: 0.428 u[IU]/mL — ABNORMAL LOW (ref 0.450–4.500)

## 2021-11-06 LAB — CMP14+EGFR
ALT: 7 IU/L (ref 0–32)
AST: 14 IU/L (ref 0–40)
Albumin/Globulin Ratio: 1.3 (ref 1.2–2.2)
Albumin: 3.8 g/dL (ref 3.8–4.9)
Alkaline Phosphatase: 83 IU/L (ref 44–121)
BUN/Creatinine Ratio: 11 (ref 9–23)
BUN: 9 mg/dL (ref 6–24)
Bilirubin Total: 0.3 mg/dL (ref 0.0–1.2)
CO2: 22 mmol/L (ref 20–29)
Calcium: 9 mg/dL (ref 8.7–10.2)
Chloride: 102 mmol/L (ref 96–106)
Creatinine, Ser: 0.8 mg/dL (ref 0.57–1.00)
Globulin, Total: 2.9 g/dL (ref 1.5–4.5)
Glucose: 81 mg/dL (ref 70–99)
Potassium: 4.3 mmol/L (ref 3.5–5.2)
Sodium: 134 mmol/L (ref 134–144)
Total Protein: 6.7 g/dL (ref 6.0–8.5)
eGFR: 89 mL/min/{1.73_m2} (ref >59)

## 2021-11-06 LAB — VITAMIN D 25 HYDROXY (VIT D DEFICIENCY, FRACTURES): Vit D, 25-Hydroxy: 46.2 ng/mL (ref 30.0–100.0)

## 2021-11-06 LAB — CBC
Hematocrit: 39 % (ref 34.0–46.6)
Hemoglobin: 13 g/dL (ref 11.1–15.9)
MCH: 29.3 pg (ref 26.6–33.0)
MCHC: 33.3 g/dL (ref 31.5–35.7)
MCV: 88 fL (ref 79–97)
Platelets: 348 10*3/uL (ref 150–450)
RBC: 4.44 x10E6/uL (ref 3.77–5.28)
RDW: 12.6 % (ref 11.7–15.4)
WBC: 7.3 10*3/uL (ref 3.4–10.8)

## 2021-11-08 ENCOUNTER — Other Ambulatory Visit: Payer: Self-pay

## 2021-11-08 ENCOUNTER — Encounter: Payer: Self-pay | Admitting: Family Medicine

## 2021-11-08 DIAGNOSIS — R7989 Other specified abnormal findings of blood chemistry: Secondary | ICD-10-CM

## 2021-11-08 NOTE — Assessment & Plan Note (Signed)
° °  Patient re-educated about  the importance of commitment to a  minimum of 150 minutes of exercise per week as able.  The importance of healthy food choices with portion control discussed, as well as eating regularly and within a 12 hour window most days. The need to choose "clean , green" food 50 to 75% of the time is discussed, as well as to make water the primary drink and set a goal of 64 ounces water daily.    Weight /BMI 11/04/2021 06/23/2021 03/09/2021  WEIGHT 187 lb 194 lb 192 lb  HEIGHT 4\' 10"  4\' 10"  4\' 10"   BMI 39.08 kg/m2 40.55 kg/m2 40.13 kg/m2

## 2021-11-08 NOTE — Assessment & Plan Note (Signed)
Controlled, no change in medication  

## 2021-11-08 NOTE — Assessment & Plan Note (Signed)
Improved, butr dependent on bedtime alpprazolam for sleep

## 2021-11-08 NOTE — Progress Notes (Signed)
° °  [name] [name]     MRN: [id number]      DOB: 1970-12-04   HPI [name] is here for follow up and re-evaluation of chronic medical conditions, medication management and review of any available recent lab and radiology data.  Preventive health is updated, specifically  Cancer screening and Immunization.   Questions or concerns regarding consultations or procedures which the PT has had in the interim are  addressed. The PT denies any adverse reactions to current medications since the last visit.  rEquests medication for weight loss as she has not made the progress she anteds   ROS Denies recent fever or chills. Denies sinus pressure, nasal congestion, ear pain or sore throat. Denies chest congestion, productive cough or wheezing. Denies chest pains, palpitations and leg swelling Denies abdominal pain, nausea, vomiting,diarrhea or constipation.   Denies dysuria, frequency, hesitancy or incontinence. C/o intermittent shooting left knee pain, no near falls  Denies headaches, seizures, numbness, or tingling. Denies depression, anxiety or insomnia. Denies skin break down or rash.   PE  BP 122/84 (BP Location: Right Arm, Patient Position: Sitting, Cuff Size: Large)    Pulse 99    Ht 4\' 10"  (1.473 m)    Wt 187 lb (84.8 kg)    LMP 11/17/2015    SpO2 99%    BMI 39.08 kg/m    Patient alert and oriented and in no cardiopulmonary distress.  HEENT: No facial asymmetry, EOMI,     Neck supple .  Chest: Clear to auscultation bilaterally.  CVS: S1, S2 no murmurs, no S3.Regular rate.  ABD: Soft non tender.   Ext: No edema  MS: Adequate ROM spine, shoulders, hips and knees.  Skin: Intact, no ulcerations or rash noted.  Psych: Good eye contact, normal affect. Memory intact not anxious or depressed appearing.  CNS: CN 2-12 intact, power,  normal throughout.no focal deficits noted.   Assessment & Plan  Knee pain, left Recent flare of pain and instability x 2 months, refer Ortho  Morbid  obesity ([place])   Patient re-educated about  the importance of commitment to a  minimum of 150 minutes of exercise per week as able.  The importance of healthy food choices with portion control discussed, as well as eating regularly and within a 12 hour window most days. The need to choose "clean , green" food 50 to 75% of the time is discussed, as well as to make water the primary drink and set a goal of 64 ounces water daily.    Weight /BMI 11/04/2021 06/23/2021 03/09/2021  WEIGHT 187 lb 194 lb 192 lb  HEIGHT 4\' 10"  4\' 10"  4\' 10"   BMI 39.08 kg/m2 40.55 kg/m2 40.13 kg/m2      ANXIETY DISORDER, GENERALIZED Improved, butr dependent on bedtime alpprazolam for sleep  Insomnia Sleep hygiene reviewed and written information offered also. Prescription sent for  medication needed. Controlled, no change in medication   GERD Controlled, no change in medication

## 2021-11-08 NOTE — Assessment & Plan Note (Signed)
Sleep hygiene reviewed and written information offered also. Prescription sent for  medication needed. Controlled, no change in medication  

## 2021-11-10 LAB — SPECIMEN STATUS REPORT

## 2021-11-10 LAB — TSH+T4F+T3FREE
Free T4: 1.33 ng/dL (ref 0.82–1.77)
T3, Free: 3.3 pg/mL (ref 2.0–4.4)
TSH: 0.43 u[IU]/mL — ABNORMAL LOW (ref 0.450–4.500)

## 2021-11-12 ENCOUNTER — Other Ambulatory Visit (HOSPITAL_COMMUNITY): Payer: Self-pay | Admitting: Orthopedic Surgery

## 2021-11-12 ENCOUNTER — Other Ambulatory Visit: Payer: Self-pay | Admitting: Orthopedic Surgery

## 2021-11-12 DIAGNOSIS — S83249A Other tear of medial meniscus, current injury, unspecified knee, initial encounter: Secondary | ICD-10-CM

## 2021-11-12 DIAGNOSIS — S83242D Other tear of medial meniscus, current injury, left knee, subsequent encounter: Secondary | ICD-10-CM

## 2021-11-29 ENCOUNTER — Other Ambulatory Visit: Payer: Self-pay

## 2021-11-29 ENCOUNTER — Ambulatory Visit (HOSPITAL_COMMUNITY)
Admission: RE | Admit: 2021-11-29 | Discharge: 2021-11-29 | Disposition: A | Discharge status: Home / Self Care | Payer: BC Managed Care – PPO | Source: Ambulatory Visit | Attending: Orthopedic Surgery | Admitting: Orthopedic Surgery

## 2021-11-29 DIAGNOSIS — S83242D Other tear of medial meniscus, current injury, left knee, subsequent encounter: Secondary | ICD-10-CM | POA: Insufficient documentation

## 2021-12-29 ENCOUNTER — Ambulatory Visit: Payer: BC Managed Care – PPO | Admitting: Family Medicine

## 2021-12-29 ENCOUNTER — Encounter: Payer: Self-pay | Admitting: Family Medicine

## 2021-12-29 VITALS — BP 125/81 | HR 82 | Resp 16 | Ht <= 58 in | Wt 184.0 lb

## 2021-12-29 DIAGNOSIS — J301 Allergic rhinitis due to pollen: Secondary | ICD-10-CM

## 2021-12-29 DIAGNOSIS — Z23 Encounter for immunization: Secondary | ICD-10-CM | POA: Diagnosis not present

## 2021-12-29 DIAGNOSIS — K219 Gastro-esophageal reflux disease without esophagitis: Secondary | ICD-10-CM

## 2021-12-29 DIAGNOSIS — F411 Generalized anxiety disorder: Secondary | ICD-10-CM

## 2021-12-29 DIAGNOSIS — F5104 Psychophysiologic insomnia: Secondary | ICD-10-CM | POA: Diagnosis not present

## 2021-12-29 DIAGNOSIS — E559 Vitamin D deficiency, unspecified: Secondary | ICD-10-CM

## 2021-12-29 DIAGNOSIS — E042 Nontoxic multinodular goiter: Secondary | ICD-10-CM

## 2021-12-29 DIAGNOSIS — R7989 Other specified abnormal findings of blood chemistry: Secondary | ICD-10-CM | POA: Diagnosis not present

## 2021-12-29 MED ORDER — WEGOVY 0.25 MG/0.5ML ~~LOC~~ SOAJ: 0.2500 mg | SUBCUTANEOUS | 2 refills | Status: DC

## 2021-12-29 NOTE — Patient Instructions (Signed)
F/u in 10 weeks , call if you need me sooner ? ?TdAP today ? ?You are referred for thyroid US ? ?Congrats on weight loss, keep it up! ? ?Stay on same dose of wegovy, send a message if I need to increase dose ? ?It is important that you exercise regularly at least 30 minutes 5 times a week. If you develop chest pain, have severe difficulty breathing, or feel very tired, stop exercising immediately and seek medical attention  ? ?Think about what you will eat, plan ahead. ?Choose " clean, green, fresh or frozen" over canned, processed or packaged foods which are more sugary, salty and fatty. ?70 to 75% of food eaten should be vegetables and fruit. ?Three meals at set times with snacks allowed between meals, but they must be fruit or vegetables. ?Aim to eat over a 12 hour period , example 7 am to 7 pm, and STOP after  your last meal of the day. ?Drink water,generally about 64 ounces per day, no other drink is as healthy. Fruit juice is best enjoyed in a healthy way, by EATING the fruit. ?Thanks for choosing [facility], we consider it a privelige to serve you. ? ?

## 2021-12-30 ENCOUNTER — Encounter: Payer: Self-pay | Admitting: Family Medicine

## 2021-12-30 ENCOUNTER — Ambulatory Visit: Payer: BC Managed Care – PPO | Admitting: Family Medicine

## 2021-12-30 NOTE — Progress Notes (Signed)
? ?  [name] [name]     MRN: [id number]      DOB: 06/13/1971 ? ? ?HPI ?Ms. Rief is here for follow up and re-evaluation of chronic medical conditions, medication management and review of any available recent lab and radiology data.  ?Preventive health is updated, specifically  Cancer screening and Immunization.   ?Questions or concerns regarding consultations or procedures which the PT has had in the interim are  addressed. ?The PT denies any adverse reactions to current medications since the last visit.  ?There are no new concerns.  ?There are no specific complaints  ? ?ROS ?Denies recent fever or chills. ?Denies sinus pressure, nasal congestion, ear pain or sore throat. ?Denies chest congestion, productive cough or wheezing. ?Denies chest pains, palpitations and leg swelling ?Denies abdominal pain, nausea, vomiting,diarrhea or constipation.   ?Denies dysuria, frequency, hesitancy or incontinence. ?Denies joint pain, swelling and limitation in mobility. ?Denies headaches, seizures, numbness, or tingling. ?Denies depression, anxiety or insomnia. ?Denies skin break down or rash. ? ? ?PE ? ?BP 125/81   Pulse 82   Resp 16   Ht '4\' 10"'$  (1.473 m)   Wt 184 lb (83.5 kg)   LMP 11/17/2015   SpO2 97%   BMI 38.46 kg/m?  ? ?Patient alert and oriented and in no cardiopulmonary distress. ? ?HEENT: No facial asymmetry, EOMI,     Neck supple . ? ?Chest: Clear to auscultation bilaterally. ? ?CVS: S1, S2 no murmurs, no S3.Regular rate. ? ?ABD: Soft non tender.  ? ?Ext: No edema ? ?MS: Adequate ROM spine, shoulders, hips and knees. ? ?Skin: Intact, no ulcerations or rash noted. ? ?Psych: Good eye contact, normal affect. Memory intact not anxious or depressed appearing. ? ?CNS: CN 2-12 intact, power,  normal throughout.no focal deficits noted. ? ? ?Assessment & Plan ? ?Multinodular goiter ?Updated [place] and has abn TSH ? ?Morbid obesity ([place]) ?Improved, continue current management ? ?Patient re-educated about  the importance of  commitment to a  minimum of 150 minutes of exercise per week as able. ? ?The importance of healthy food choices with portion control discussed, as well as eating regularly and within a 12 hour window most days. ?The need to choose "clean , green" food 50 to 75% of the time is discussed, as well as to make water the primary drink and set a goal of 64 ounces water daily. ? ?  ? ?  12/29/2021  ?  2:11 PM 11/08/2021  ? 10:06 AM 11/04/2021  ?  4:05 PM  ?Weight /BMI  ?Weight 184 lb  187 lb  ?Height '4\' 10"'$  (1.473 m) '4\' 10"'$  (1.473 m) '4\' 11"'$  (1.499 m)  ?BMI 38.46 kg/m2 39.08 kg/m2 37.77 kg/m2  ? ? ? ? ?Insomnia ?Sleep hygiene reviewed and written information offered also. ?Prescription sent for  medication needed. ? ? ?ANXIETY DISORDER, GENERALIZED ?Controlled, no change in medication ? ? ?Allergic rhinitis ?Controlled, no change in medication ? ? ?Abnormal TSH ?Thyroid US to further eval ? ?Vitamin D deficiency ?Controlled, no change in medication ? ? ?GERD ?Controlled, no change in medication ? ? ?

## 2021-12-30 NOTE — Assessment & Plan Note (Signed)
Controlled, no change in medication  

## 2021-12-30 NOTE — Assessment & Plan Note (Signed)
Thyroid US to further eval ?

## 2021-12-30 NOTE — Assessment & Plan Note (Signed)
Improved, continue current management ? ?Patient re-educated about  the importance of commitment to a  minimum of 150 minutes of exercise per week as able. ? ?The importance of healthy food choices with portion control discussed, as well as eating regularly and within a 12 hour window most days. ?The need to choose "clean , green" food 50 to 75% of the time is discussed, as well as to make water the primary drink and set a goal of 64 ounces water daily. ? ?  ? ?  12/29/2021  ?  2:11 PM 11/08/2021  ? 10:06 AM 11/04/2021  ?  4:05 PM  ?Weight /BMI  ?Weight 184 lb  187 lb  ?Height '4\' 10"'$  (1.473 m) '4\' 10"'$  (1.473 m) '4\' 11"'$  (1.499 m)  ?BMI 38.46 kg/m2 39.08 kg/m2 37.77 kg/m2  ? ? ? ?

## 2021-12-30 NOTE — Assessment & Plan Note (Signed)
Sleep hygiene reviewed and written information offered also. Prescription sent for  medication needed.  

## 2021-12-30 NOTE — Assessment & Plan Note (Signed)
Updated [place] and has abn TSH ?

## 2022-01-07 ENCOUNTER — Ambulatory Visit (HOSPITAL_COMMUNITY)
Admission: RE | Admit: 2022-01-07 | Discharge: 2022-01-07 | Disposition: A | Discharge status: Home / Self Care | Payer: BC Managed Care – PPO | Source: Ambulatory Visit | Attending: Family Medicine | Admitting: Family Medicine

## 2022-01-07 DIAGNOSIS — R7989 Other specified abnormal findings of blood chemistry: Secondary | ICD-10-CM | POA: Diagnosis present

## 2022-03-06 ENCOUNTER — Other Ambulatory Visit: Payer: Self-pay | Admitting: Family Medicine

## 2022-03-09 ENCOUNTER — Ambulatory Visit: Payer: BC Managed Care – PPO | Admitting: Family Medicine

## 2022-03-09 ENCOUNTER — Encounter (HOSPITAL_COMMUNITY): Payer: Self-pay | Admitting: *Deleted

## 2022-03-10 ENCOUNTER — Ambulatory Visit: Payer: BC Managed Care – PPO | Admitting: Family Medicine

## 2022-03-16 ENCOUNTER — Ambulatory Visit: Payer: BC Managed Care – PPO | Admitting: Family Medicine

## 2022-03-23 ENCOUNTER — Ambulatory Visit: Payer: BC Managed Care – PPO | Admitting: Family Medicine

## 2022-03-23 ENCOUNTER — Encounter: Payer: Self-pay | Admitting: Family Medicine

## 2022-03-23 VITALS — BP 123/81 | HR 82 | Ht 59.0 in | Wt 177.1 lb

## 2022-03-23 DIAGNOSIS — K219 Gastro-esophageal reflux disease without esophagitis: Secondary | ICD-10-CM

## 2022-03-23 DIAGNOSIS — F5104 Psychophysiologic insomnia: Secondary | ICD-10-CM

## 2022-03-23 DIAGNOSIS — Z1231 Encounter for screening mammogram for malignant neoplasm of breast: Secondary | ICD-10-CM | POA: Diagnosis not present

## 2022-03-23 MED ORDER — NYSTATIN 100000 UNIT/GM EX POWD: Freq: Two times a day (BID) | CUTANEOUS | 1 refills | Status: DC

## 2022-03-23 MED ORDER — SEMAGLUTIDE-WEIGHT MANAGEMENT 0.5 MG/0.5ML ~~LOC~~ SOAJ: 0.5000 mg | SUBCUTANEOUS | 3 refills | Status: DC

## 2022-03-23 MED ORDER — OMEPRAZOLE 40 MG PO CPDR: DELAYED_RELEASE_CAPSULE | ORAL | 1 refills | Status: DC

## 2022-03-23 NOTE — Patient Instructions (Addendum)
F/u in 14 weeks , call if you need me sooner  I have prescribed a higher dose of wegovy, contiue with chage in eating, send a message  if you start gaining weight if unable to get the wegovy. I believe you  cAN keep the window shut!  Please schedule 11/10 or after mammogram at checkout  You need to call dr Garwin Brothers for [facility] exam  Please try to reduce caffeine and continue weight loss , so you can taper off of omeprazole 40 mg  tabs , if able, now taking alt day, increased risk of adverse side effects with long term use, IF ABLE  It is important that you exercise regularly at least 30 minutes 5 times a week. If you develop chest pain, have severe difficulty breathing, or feel very tired, stop exercising immediately and seek medical attention   Thanks for choosing [facility], we consider it a privelige to serve you.

## 2022-03-23 NOTE — Progress Notes (Signed)
   [name] [name]     MRN: [id number]      DOB: 1970-10-29   HPI [name] is here for follow up and re-evaluation of chronic medical conditions, medication management and review of any available recent lab and radiology data.  Preventive health is updated, specifically  Cancer screening and Immunization.   Questions or concerns regarding consultations or procedures which the PT has had in the interim are  addressed. The PT denies any adverse reactions to current medications since the last visit.  There are no new concerns.  There are no specific complaints   ROS Denies recent fever or chills. Denies sinus pressure, nasal congestion, ear pain or sore throat. Denies chest congestion, productive cough or wheezing. Denies chest pains, palpitations and leg swelling Denies abdominal pain, nausea, vomiting,diarrhea or constipation.   Denies dysuria, frequency, hesitancy or incontinence. Denies joint pain, swelling and limitation in mobility. Denies headaches, seizures, numbness, or tingling. Denies depression, anxiety or insomnia. Denies skin break down or rash.   PE  BP 123/81   Pulse 82   Ht '4\' 11"'$  (1.499 m)   Wt 177 lb 1.9 oz (80.3 kg)   LMP 11/17/2015   SpO2 96%   BMI 35.77 kg/m   Patient alert and oriented and in no cardiopulmonary distress.  HEENT: No facial asymmetry, EOMI,     Neck supple .  Chest: Clear to auscultation bilaterally.  CVS: S1, S2 no murmurs, no S3.Regular rate.  ABD: Soft non tender.   Ext: No edema  MS: Adequate ROM spine, shoulders, hips and knees.  Skin: Intact, no ulcerations or rash noted.  Psych: Good eye contact, normal affect. Memory intact not anxious or depressed appearing.  CNS: CN 2-12 intact, power,  normal throughout.no focal deficits noted.   Assessment & Plan Morbid obesity ([place]) Excellent weight loss, incwegovy dose, no adverse s/e hopefully will be able to get med soon  Patient re-educated about  the importance of commitment  to a  minimum of 150 minutes of exercise per week as able.  The importance of healthy food choices with portion control discussed, as well as eating regularly and within a 12 hour window most days. The need to choose "clean , green" food 50 to 75% of the time is discussed, as well as to make water the primary drink and set a goal of 64 ounces water daily.       03/23/2022    2:44 PM 12/29/2021    2:11 PM 11/08/2021   10:06 AM  Weight /BMI  Weight 177 lb 1.9 oz 184 lb   Height '4\' 11"'$  (1.499 m) '4\' 10"'$  (1.473 m) '4\' 10"'$  (1.473 m)  BMI 35.77 kg/m2 38.46 kg/m2 39.08 kg/m2      GERD Taking PPI a;lt day, counseled re need to try to wean off with weigh loss, reduction in caffeine  Insomnia Sleep hygiene reviewed and written information offered also. Prescription sent for  medication needed. Controlled, no change in medication

## 2022-03-26 ENCOUNTER — Encounter: Payer: Self-pay | Admitting: Family Medicine

## 2022-03-26 MED ORDER — ALPRAZOLAM 1 MG PO TABS: 1.0000 mg | ORAL_TABLET | Freq: Every day | ORAL | 5 refills | Status: DC

## 2022-03-26 NOTE — Assessment & Plan Note (Signed)
Excellent weight loss, incwegovy dose, no adverse s/e hopefully will be able to get med soon  Patient re-educated about  the importance of commitment to a  minimum of 150 minutes of exercise per week as able.  The importance of healthy food choices with portion control discussed, as well as eating regularly and within a 12 hour window most days. The need to choose "clean , green" food 50 to 75% of the time is discussed, as well as to make water the primary drink and set a goal of 64 ounces water daily.       03/23/2022    2:44 PM 12/29/2021    2:11 PM 11/08/2021   10:06 AM  Weight /BMI  Weight 177 lb 1.9 oz 184 lb   Height '4\' 11"'$  (1.499 m) '4\' 10"'$  (1.473 m) '4\' 10"'$  (1.473 m)  BMI 35.77 kg/m2 38.46 kg/m2 39.08 kg/m2

## 2022-03-26 NOTE — Assessment & Plan Note (Signed)
Taking PPI a;lt day, counseled re need to try to wean off with weigh loss, reduction in caffeine

## 2022-03-26 NOTE — Assessment & Plan Note (Signed)
Sleep hygiene reviewed and written information offered also. Prescription sent for  medication needed. Controlled, no change in medication  

## 2022-03-31 ENCOUNTER — Other Ambulatory Visit: Payer: Self-pay | Admitting: Family Medicine

## 2022-04-16 ENCOUNTER — Ambulatory Visit
Admission: RE | Admit: 2022-04-16 | Discharge: 2022-04-16 | Disposition: A | Discharge status: Home / Self Care | Payer: BC Managed Care – PPO | Source: Ambulatory Visit | Attending: Family Medicine | Admitting: Family Medicine

## 2022-04-16 VITALS — BP 135/85 | HR 67 | Temp 98.5°F | Resp 18 | Ht 59.0 in | Wt 175.0 lb

## 2022-04-16 DIAGNOSIS — J069 Acute upper respiratory infection, unspecified: Secondary | ICD-10-CM | POA: Diagnosis not present

## 2022-04-16 MED ORDER — AMOXICILLIN-POT CLAVULANATE 875-125 MG PO TABS: 1.0000 | ORAL_TABLET | Freq: Two times a day (BID) | ORAL | 0 refills | Status: DC

## 2022-04-16 NOTE — ED Provider Notes (Signed)
[facility]    CSN: [id number] Arrival date & time: 04/16/22  1105      History   Chief Complaint Chief Complaint  Patient presents with   Sore Throat    Sore throat and nasal congestion. X1 day    HPI [name] [name] is a 51 y.o. female.   Patient presents today with about a week of sore, swollen feeling throat, nasal congestion, fatigue.  Denies fever, chills, body aches, chest pain, shortness of breath, abdominal pain, nausea vomiting or diarrhea.  So far taking over-the-counter antihistamines, nasal sprays with no relief.  History of seasonal allergies.  States she has had issues like this in the past and has always required an antibiotic.    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 08/15/2016   Allergic sinusitis 03/14/2016   Intermittent palpitations 12/12/2015   Paroxysmal atrial tachycardia ([place]) 12/12/2015   Insomnia 11/23/2015   Knee pain, left 12/16/2014   Morbid obesity ([place]) 11/20/2012   Vitamin D deficiency 11/20/2012   ANXIETY DISORDER, GENERALIZED 07/13/2007   Allergic rhinitis 07/13/2007   GERD 07/13/2007    Past Surgical History:  Procedure Laterality Date   ABDOMINAL HYSTERECTOMY  01/16/2016 [facility])   partial for fibroids   CESAREAN SECTION     COLONOSCOPY WITH PROPOFOL N/A 02/10/2021   Procedure: COLONOSCOPY WITH PROPOFOL;  Surgeon: Rogene Houston, MD;  Location: [facility];  Service: Endoscopy;  Laterality: N/A;  11:00   DILATION AND CURETTAGE OF UTERUS     HYSTEROSCOPY WITH D & C  04/02/2012   Procedure: DILATATION  AND CURETTAGE /HYSTEROSCOPY;  Surgeon: Marvene Staff, MD;  Location: [facility];  Service: Gynecology;  Laterality: N/A;   LAPAROSCOPIC GASTRIC SLEEVE RESECTION WITH HIATAL HERNIA REPAIR N/A 08/15/2016   Procedure: LAPAROSCOPIC GASTRIC SLEEVE RESECTION WITH HIATAL HERNIA REPAIR WITH UPPER ENDO;  Surgeon: Johnathan Hausen, MD;  Location: [facility];  Service: General;  Laterality: N/A;   LAPAROSCOPIC TUBAL LIGATION  04/02/2012   Procedure: LAPAROSCOPIC TUBAL LIGATION;  Surgeon: Marvene Staff, MD;  Location: [facility];  Service: Gynecology;  Laterality: Bilateral;   TRANSTHORACIC ECHOCARDIOGRAM  01/11/2009   EF >55%, normal    OB History   No obstetric history on file.      Home Medications    Prior to Admission medications   Medication Sig Start Date End Date Taking? Authorizing Provider  acetaminophen (TYLENOL) 500 MG tablet Take 1,000 mg by mouth every 6 (six) hours as needed for moderate pain or headache.   Yes [provider]  ALPRAZolam Duanne Moron) 1 MG tablet Take 1 tablet (1 mg total) by mouth at bedtime. 04/15/22  Yes Fayrene Helper, MD  amoxicillin-clavulanate (AUGMENTIN) 875-125 MG tablet Take 1 tablet by mouth every 12 (twelve) hours. 04/16/22  Yes Volney American, PA-C  clotrimazole-betamethasone (LOTRISONE) cream APPLY EXTERNALLY TO THE AFFECTED AREA TWICE DAILY FOR 1 WEEK FOR ITCHY RASH THEN AS NEEDED, TO THE AFFECTED AREA 06/23/21  Yes Fayrene Helper, MD  Fluocinolone Acetonide Scalp 0.01 %  OIL Apply topically every other day. 07/28/21  Yes [provider]  fluticasone (FLONASE) 50 MCG/ACT nasal spray SHAKE LIQUID AND USE 2 SPRAYS IN EACH NOSTRIL DAILY 03/07/22  Yes Fayrene Helper, MD  NYSTATIN powder Apply topically 2 (two) times daily. 03/23/22  Yes Fayrene Helper, MD  omeprazole (PRILOSEC) 40 MG capsule TAKE 1 CAPSULE(40 MG) BY MOUTH DAILY 03/23/22  Yes Fayrene Helper, MD  Propylene Glycol (SYSTANE BALANCE) 0.6 % SOLN Place 1 drop into  both eyes daily as needed (dry eyes).   Yes [provider]  RESTASIS 0.05 % ophthalmic emulsion Place 1 drop into both eyes 2 (two) times daily. 07/16/13  Yes [provider]  Semaglutide-Weight Management 0.5 MG/0.5ML SOAJ Inject 0.5 mg into the skin once a week. 03/23/22  Yes Fayrene Helper, MD    Family History Family History  Problem Relation Age of Onset   Cancer Father 34        prostate   Diabetes Father    Hypertension Father    Hypertension Mother    Hypertension Sister    Diabetes Sister    Hypertension Sister    Diabetes Sister    Cancer Paternal Aunt 7       breast cancer   Breast cancer Paternal Aunt 55    Social History Social History   Tobacco Use   Smoking status: Never   Smokeless tobacco: Never  Substance Use Topics   Alcohol use: Not Currently    Comment: rarely   Drug use: No     Allergies   Diltiazem and Phentermine   Review of Systems Review of Systems Per HPI  Physical Exam Triage Vital Signs ED Triage Vitals  Enc Vitals Group     BP 04/16/22 1149 135/85     Pulse Rate 04/16/22 1149 67     Resp 04/16/22 1149 18     Temp 04/16/22 1149 98.5 F (36.9 C)     Temp Source 04/16/22 1149 Oral     SpO2 04/16/22 1149 98 %     Weight 04/16/22 1148 175 lb (79.4 kg)     Height 04/16/22 1148 '4\' 11"'$  (1.499 m)     Head Circumference --      Peak Flow --      Pain Score 04/16/22 1148 9     Pain Loc --      Pain Edu? --      Excl. in [place]? --    No data found.  Updated Vital Signs BP 135/85 (BP Location: Right Arm)   Pulse 67   Temp 98.5 F (36.9 C) (Oral)   Resp 18   Ht '4\' 11"'$  (1.499 m)   Wt 175 lb (79.4 kg)   LMP 11/17/2015   SpO2 98%   BMI 35.35 kg/m   Visual Acuity Right Eye Distance:   Left Eye Distance:   Bilateral Distance:    Right Eye Near:   Left Eye Near:    Bilateral Near:     Physical Exam Vitals and nursing note reviewed.  Constitutional:      Appearance: Normal appearance.  HENT:      Head: Atraumatic.     Right Ear: Tympanic membrane and external ear normal.     Left Ear: Tympanic membrane and external ear normal.     Nose: Rhinorrhea present.     Mouth/Throat:     Mouth: Mucous membranes are moist.     Pharynx: Posterior oropharyngeal erythema present. No oropharyngeal  exudate.  Eyes:     Extraocular Movements: Extraocular movements intact.     Conjunctiva/sclera: Conjunctivae normal.  Cardiovascular:     Rate and Rhythm: Normal rate and regular rhythm.     Heart sounds: Normal heart sounds.  Pulmonary:     Effort: Pulmonary effort is normal.     Breath sounds: Normal breath sounds. No wheezing.  Musculoskeletal:        General: Normal range of motion.     Cervical back: Normal range of motion and neck supple.  Lymphadenopathy:     Cervical: No cervical adenopathy.  Skin:    General: Skin is warm and dry.  Neurological:     Mental Status: She is alert and oriented to person, place, and time.     Motor: No weakness.     Gait: Gait normal.  Psychiatric:        Mood and Affect: Mood normal.        Thought Content: Thought content normal.      UC Treatments / Results  Labs (all labs ordered are listed, but only abnormal results are displayed) Labs Reviewed - No data to display  EKG   Radiology No results found.  Procedures Procedures (including critical care time)  Medications Ordered in UC Medications - No data to display  Initial Impression / Assessment and Plan / UC Course  I have reviewed the triage vital signs and the nursing notes.  Pertinent labs & imaging results that were available during my care of the patient were reviewed by me and considered in my medical decision making (see chart for details).     Do suspect more viral cause of symptoms, she declines viral testing today.  Will provide antibiotic in case becoming secondary bacterial infection but discussed that this will just need to run its course.  Supportive over-the-counter  medications and home care recommended.  Return for worsening symptoms.  Final Clinical Impressions(s) / UC Diagnoses   Final diagnoses:  Upper respiratory tract infection, unspecified type   Discharge Instructions   None    ED Prescriptions     Medication Sig Dispense Auth. Provider   amoxicillin-clavulanate (AUGMENTIN) 875-125 MG tablet Take 1 tablet by mouth every 12 (twelve) hours. 14 tablet Volney American, [place]      PDMP not reviewed this encounter.   Volney American, [place] 04/16/22 1226

## 2022-04-16 NOTE — ED Triage Notes (Signed)
Pt states that she has a sore throat and some nasal congestion. X1 day  Pt states that she is vaccinated for covid.  Pt states that she has had flu vaccine.

## 2022-04-18 ENCOUNTER — Telehealth: Payer: Self-pay

## 2022-04-18 MED ORDER — FLUCONAZOLE 150 MG PO TABS
150.0000 mg | ORAL_TABLET | Freq: Every day | ORAL | 0 refills | Status: AC
Start: 2022-04-18 — End: 2022-04-19

## 2022-06-29 ENCOUNTER — Encounter: Payer: Self-pay | Admitting: Family Medicine

## 2022-06-29 ENCOUNTER — Ambulatory Visit: Payer: BC Managed Care – PPO | Admitting: Family Medicine

## 2022-06-29 VITALS — BP 148/90 | HR 67 | Resp 16 | Ht <= 58 in | Wt 175.0 lb

## 2022-06-29 DIAGNOSIS — K219 Gastro-esophageal reflux disease without esophagitis: Secondary | ICD-10-CM

## 2022-06-29 DIAGNOSIS — F411 Generalized anxiety disorder: Secondary | ICD-10-CM | POA: Diagnosis not present

## 2022-06-29 DIAGNOSIS — R7989 Other specified abnormal findings of blood chemistry: Secondary | ICD-10-CM

## 2022-06-29 DIAGNOSIS — R030 Elevated blood-pressure reading, without diagnosis of hypertension: Secondary | ICD-10-CM

## 2022-06-29 NOTE — Patient Instructions (Signed)
F/U end November , re evaluate blood pressure, call if you need me before  Fasting chem 7 and eGFR and TSH 3 days before appointment  No med changes at this time  It is important that you exercise regularly at least 30 minutes 5 times a week. If you develop chest pain, have severe difficulty breathing, or feel very tired, stop exercising immediately and seek medical attention  Think about what you will eat, plan ahead. Choose " clean, green, fresh or frozen" over canned, processed or packaged foods which are more sugary, salty and fatty. 70 to 75% of food eaten should be vegetables and fruit. Three meals at set times with snacks allowed between meals, but they must be fruit or vegetables. Aim to eat over a 12 hour period , example 7 am to 7 pm, and STOP after  your last meal of the day. Drink water,generally about 64 ounces per day, no other drink is as healthy. Fruit juice is best enjoyed in a healthy way, by EATING the fruit. Thanks for choosing [facility], we consider it a privelige to serve you.

## 2022-06-29 NOTE — Progress Notes (Signed)
m  [name] [name]     MRN: [id number]      DOB: [birth year]   HPI [name] is here for follow up and re-evaluation of chronic medical conditions, medication management and review of any available recent lab and radiology data.  Preventive health is updated, specifically  Cancer screening and Immunization.   STILL UNABLE TO GET WEGOVY, MAINTAINING BUT NMOT LOSIONG WEIGHT. NEEDS TO START EXERCISE AND REDUCE EVENING SNACKINGints   ROS Denies recent fever or chills. Denies sinus pressure, nasal congestion, ear pain or sore throat. Denies chest congestion, productive cough or wheezing. Denies chest pains, palpitations and leg swelling Denies abdominal pain, nausea, vomiting,diarrhea or constipation.   Denies dysuria, frequency, hesitancy or incontinence. Denies joint pain, swelling and limitation in mobility. Denies headaches, seizures, numbness, or tingling. Denies depression, anxiety or insomnia. Denies skin break down or rash.   PE  BP (!) 148/90   Pulse 67   Resp 16   Ht '4\' 10"'$  (1.473 m)   Wt 175 lb (79.4 kg)   LMP 11/17/2015   SpO2 99%   BMI 36.58 kg/m   Patient alert and oriented and in no cardiopulmonary distress.  HEENT: No facial asymmetry, EOMI,     Neck supple .  Chest: Clear to auscultation bilaterally.  CVS: S1, S2 no murmurs, no S3.Regular rate.  ABD: Soft non tender.   Ext: No edema  MS: Adequate ROM spine, shoulders, hips and knees.  Skin: Intact, no ulcerations or rash noted.  Psych: Good eye contact, normal affect. Memory intact not anxious or depressed appearing.  CNS: CN 2-12 intact, power,  normal throughout.no focal deficits noted.   Assessment & Plan  Morbid obesity [facility])  Patient re-educated about  the importance of commitment to a  minimum of 150 minutes of exercise per week as able.  The importance of healthy food choices with portion control discussed, as well as eating regularly and within a 12 hour window most days. The need to choose  "clean , green" food 50 to 75% of the time is discussed, as well as to make water the primary drink and set a goal of 64 ounces water daily.       06/29/2022    4:21 PM 04/16/2022   11:48 AM 03/23/2022    2:44 PM  Weight /BMI  Weight 175 lb 175 lb 177 lb 1.9 oz  Height '4\' 10"'$  (1.473 m) '4\' 11"'$  (1.499 m) '4\' 11"'$  (1.499 m)  BMI 36.58 kg/m2 35.35 kg/m2 35.77 kg/m2    STILL WAITING ON MED  GERD Controlled, no change in medication   Elevated blood pressure reading DASH diet and commitment to daily physical activity for a minimum of 30 minutes discussed and encouraged, as a part of hypertension management. The importance of attaining a healthy weight is also discussed.  rE EVAL IN 8 WEEKS    06/29/2022    4:49 PM 06/29/2022    4:21 PM 04/16/2022   11:49 AM 04/16/2022   11:48 AM 03/23/2022    2:44 PM 12/29/2021    2:11 PM 11/04/2021    4:05 PM  BP/Weight  Systolic BP 989 211 941  740 814 481  Diastolic BP 90 82 85  81 81 84  Wt. (Lbs)  175  175 177.12 184 187  BMI  36.58 kg/m2  35.35 kg/m2 35.77 kg/m2 38.46 kg/m2 37.77 kg/m2       ANXIETY DISORDER, GENERALIZED Controlled, no change in medication   .

## 2022-06-30 ENCOUNTER — Encounter: Payer: Self-pay | Admitting: Family Medicine

## 2022-06-30 DIAGNOSIS — R030 Elevated blood-pressure reading, without diagnosis of hypertension: Secondary | ICD-10-CM | POA: Insufficient documentation

## 2022-06-30 NOTE — Assessment & Plan Note (Signed)
Controlled, no change in medication  

## 2022-06-30 NOTE — Assessment & Plan Note (Signed)
DASH diet and commitment to daily physical activity for a minimum of 30 minutes discussed and encouraged, as a part of hypertension management. The importance of attaining a healthy weight is also discussed.  rE EVAL IN 8 WEEKS    06/29/2022    4:49 PM 06/29/2022    4:21 PM 04/16/2022   11:49 AM 04/16/2022   11:48 AM 03/23/2022    2:44 PM 12/29/2021    2:11 PM 11/04/2021    4:05 PM  BP/Weight  Systolic BP 379 432 761  470 929 574  Diastolic BP 90 82 85  81 81 84  Wt. (Lbs)  175  175 177.12 184 187  BMI  36.58 kg/m2  35.35 kg/m2 35.77 kg/m2 38.46 kg/m2 37.77 kg/m2

## 2022-06-30 NOTE — Assessment & Plan Note (Signed)
  Patient re-educated about  the importance of commitment to a  minimum of 150 minutes of exercise per week as able.  The importance of healthy food choices with portion control discussed, as well as eating regularly and within a 12 hour window most days. The need to choose "clean , green" food 50 to 75% of the time is discussed, as well as to make water the primary drink and set a goal of 64 ounces water daily.       06/29/2022    4:21 PM 04/16/2022   11:48 AM 03/23/2022    2:44 PM  Weight /BMI  Weight 175 lb 175 lb 177 lb 1.9 oz  Height '4\' 10"'$  (1.473 m) '4\' 11"'$  (1.499 m) '4\' 11"'$  (1.499 m)  BMI 36.58 kg/m2 35.35 kg/m2 35.77 kg/m2    STILL WAITING ON MED

## 2022-08-01 ENCOUNTER — Ambulatory Visit (HOSPITAL_COMMUNITY): Payer: BC Managed Care – PPO

## 2022-08-04 ENCOUNTER — Ambulatory Visit: Payer: BC Managed Care – PPO | Admitting: Family Medicine

## 2022-08-04 ENCOUNTER — Encounter: Payer: Self-pay | Admitting: Family Medicine

## 2022-08-04 VITALS — BP 120/80 | HR 75 | Ht <= 58 in | Wt 177.0 lb

## 2022-08-04 DIAGNOSIS — F5104 Psychophysiologic insomnia: Secondary | ICD-10-CM | POA: Diagnosis not present

## 2022-08-04 DIAGNOSIS — Z13220 Encounter for screening for lipoid disorders: Secondary | ICD-10-CM

## 2022-08-04 DIAGNOSIS — B369 Superficial mycosis, unspecified: Secondary | ICD-10-CM | POA: Diagnosis not present

## 2022-08-04 DIAGNOSIS — E559 Vitamin D deficiency, unspecified: Secondary | ICD-10-CM

## 2022-08-04 DIAGNOSIS — R7989 Other specified abnormal findings of blood chemistry: Secondary | ICD-10-CM

## 2022-08-04 DIAGNOSIS — K219 Gastro-esophageal reflux disease without esophagitis: Secondary | ICD-10-CM

## 2022-08-04 DIAGNOSIS — R030 Elevated blood-pressure reading, without diagnosis of hypertension: Secondary | ICD-10-CM

## 2022-08-04 MED ORDER — CLOTRIMAZOLE-BETAMETHASONE 1-0.05 % EX CREA: TOPICAL_CREAM | CUTANEOUS | 1 refills | Status: DC

## 2022-08-04 MED ORDER — TERBINAFINE HCL 250 MG PO TABS: 250.0000 mg | ORAL_TABLET | Freq: Every day | ORAL | 0 refills | Status: DC

## 2022-08-04 MED ORDER — SEMAGLUTIDE-WEIGHT MANAGEMENT 1 MG/0.5ML ~~LOC~~ SOAJ: 1.0000 mg | SUBCUTANEOUS | 2 refills | Status: DC

## 2022-08-04 NOTE — Patient Instructions (Signed)
Follow-up in 3 months, call if you need me sooner.  2-week prescription sent in for antifungal tablets. For rash  Following this you may use antifungal cream prescribed clotrimazole/betamethasone alternating with nystatin powder if rash recurs.  Otherwise try to keep the area dry, with either pure Vaseline or cornstarch, to reduce recurrence of skin infection.    Fasting labs  3 to 5 days before your next visit with CBC lipid TSH CMP and EGFR and vitamin D level.  Blood pressure is excellent today it is totally normal.  Congrats on weight weight loss keep this up.  [name] [name]  is prescribed and I will adjust the dose depending on the availability of medication.  Call if you have any problems with this  It is important that you exercise regularly at least 30 minutes 5 times a week. If you develop chest pain, have severe difficulty breathing, or feel very tired, stop exercising immediately and seek medical attention   Thanks for choosing [facility], we consider it a privelige to serve you.

## 2022-08-05 ENCOUNTER — Ambulatory Visit: Payer: BC Managed Care – PPO | Admitting: Family Medicine

## 2022-08-07 ENCOUNTER — Encounter: Payer: Self-pay | Admitting: Family Medicine

## 2022-08-07 NOTE — Assessment & Plan Note (Signed)
Normotensive at today's visit and on prior visits, which is good

## 2022-08-07 NOTE — Assessment & Plan Note (Signed)
Sleep hygiene reviewed and written information offered also. Prescription sent for  medication needed.  

## 2022-08-07 NOTE — Assessment & Plan Note (Signed)
Terbinafine  x 2 weeks, and topical nystatin powder alternating with clotrimazole/betamethasone  cream

## 2022-08-07 NOTE — Assessment & Plan Note (Signed)
Controlled, no change in medication  

## 2022-08-07 NOTE — Assessment & Plan Note (Signed)
  Patient re-educated about  the importance of commitment to a  minimum of 150 minutes of exercise per week as able.  The importance of healthy food choices with portion control discussed, as well as eating regularly and within a 12 hour window most days. The need to choose "clean , green" food 50 to 75% of the time is discussed, as well as to make water the primary drink and set a goal of 64 ounces water daily.       08/04/2022    1:37 PM 06/29/2022    4:21 PM 04/16/2022   11:48 AM  Weight /BMI  Weight 177 lb 0.6 oz 175 lb 175 lb  Height '4\' 10"'$  (1.473 m) '4\' 10"'$  (1.473 m) '4\' 11"'$  (1.499 m)  BMI 37 kg/m2 36.58 kg/m2 35.35 kg/m2    Increase wegovy dose

## 2022-08-07 NOTE — Progress Notes (Signed)
   [name] [name]     MRN: [id number]      DOB: September 13, 1971   HPI [name] [name] is here for follow up and re-evaluation of chronic medical conditions,specifically she had an elevated blood pressure at recent visit , and is here for re evaluation of this medication management and review of any available recent lab and radiology data.  Preventive health is updated, specifically  Cancer screening and Immunization.   C/o recurrent rash under left breast which responds to topicals then recurrs, feels rough , wants it checked C/o difficulty with weight loss but continues to work on this  ROS Denies recent fever or chills. Denies sinus pressure, nasal congestion, ear pain or sore throat. Denies chest congestion, productive cough or wheezing. Denies chest pains, palpitations and leg swelling Denies abdominal pain, nausea, vomiting,diarrhea or constipation.   Denies dysuria, frequency, hesitancy or incontinence. Denies joint pain, swelling and limitation in mobility. Denies headaches, seizures, numbness, or tingling. Denies depression, anxiety or insomnia.    PE  BP 120/80   Pulse 75   Ht '4\' 10"'$  (1.473 m)   Wt 177 lb 0.6 oz (80.3 kg)   LMP 11/17/2015   SpO2 97%   BMI 37.00 kg/m   Patient alert and oriented and in no cardiopulmonary distress.  HEENT: No facial asymmetry, EOMI,     Neck supple .  Chest: Clear to auscultation bilaterally.  CVS: S1, S2 no murmurs, no S3.Regular rate.  ABD: Soft non tender.   Ext: No edema  MS: Adequate ROM spine, shoulders, hips and knees.  Skin: Intact rash under left breast, no skin breakdown or purulent drainage  Psych: Good eye contact, normal affect. Memory intact not anxious or depressed appearing.  CNS: CN 2-12 intact, power,  normal throughout.no focal deficits noted.   Assessment & Plan  Dermatomycosis Terbinafine  x 2 weeks, and topical nystatin powder alternating with clotrimazole/betamethasone  cream  Morbid obesity ([place])  Patient  re-educated about  the importance of commitment to a  minimum of 150 minutes of exercise per week as able.  The importance of healthy food choices with portion control discussed, as well as eating regularly and within a 12 hour window most days. The need to choose "clean , green" food 50 to 75% of the time is discussed, as well as to make water the primary drink and set a goal of 64 ounces water daily.       08/04/2022    1:37 PM 06/29/2022    4:21 PM 04/16/2022   11:48 AM  Weight /BMI  Weight 177 lb 0.6 oz 175 lb 175 lb  Height '4\' 10"'$  (1.473 m) '4\' 10"'$  (1.473 m) '4\' 11"'$  (1.499 m)  BMI 37 kg/m2 36.58 kg/m2 35.35 kg/m2    Increase wegovy dose  Insomnia Sleep hygiene reviewed and written information offered also. Prescription sent for  medication needed.   GERD Controlled, no change in medication

## 2022-08-15 ENCOUNTER — Ambulatory Visit (HOSPITAL_COMMUNITY)
Admission: RE | Admit: 2022-08-15 | Discharge: 2022-08-15 | Disposition: A | Discharge status: Home / Self Care | Payer: BC Managed Care – PPO | Source: Ambulatory Visit | Attending: Family Medicine | Admitting: Family Medicine

## 2022-08-15 DIAGNOSIS — Z1231 Encounter for screening mammogram for malignant neoplasm of breast: Secondary | ICD-10-CM | POA: Insufficient documentation

## 2022-10-17 ENCOUNTER — Other Ambulatory Visit: Payer: Self-pay | Admitting: Family Medicine

## 2022-10-18 ENCOUNTER — Telehealth: Payer: Self-pay | Admitting: Family Medicine

## 2022-10-18 MED ORDER — ALPRAZOLAM 1 MG PO TABS: 1.0000 mg | ORAL_TABLET | Freq: Every day | ORAL | 5 refills | Status: DC

## 2022-10-18 NOTE — Telephone Encounter (Signed)
Next appt 2/16. Requests refill

## 2022-10-18 NOTE — Telephone Encounter (Signed)
Prescription Request  10/18/2022  Is this a "Controlled Substance" medicine? No  LOV: 08/04/2022  What is the name of the medication or equipment? ALPRAZolam (XANAX) 1 MG tablet [[id number]]   Have you contacted your pharmacy to request a refill? No   Which pharmacy would you like this sent to?  [id number] - [place], [address] [address] Phone: [phone] Fax: [phone]    Patient notified that their request is being sent to the clinical staff for review and that they should receive a response within 2 business days.   Please advise at Mobile [phone] (mobile)

## 2022-10-28 ENCOUNTER — Telehealth: Payer: Self-pay | Admitting: Radiology

## 2022-10-28 NOTE — Telephone Encounter (Signed)
Patient called, LMVM asking for a call. Please call.  Thanks.

## 2022-11-04 ENCOUNTER — Ambulatory Visit: Payer: BC Managed Care – PPO | Admitting: Family Medicine

## 2022-11-17 ENCOUNTER — Encounter: Payer: Self-pay | Admitting: Radiology

## 2022-11-18 ENCOUNTER — Other Ambulatory Visit: Payer: Self-pay | Admitting: Family Medicine

## 2022-11-24 ENCOUNTER — Ambulatory Visit: Payer: BC Managed Care – PPO | Admitting: Family Medicine

## 2022-12-09 IMAGING — US US THYROID
1 series · 13 of 25 positions shown · non-contrast
Comparison: 11/29/2016

CLINICAL DATA: Abnormal TSH

EXAM:
THYROID ULTRASOUND
TECHNIQUE: Ultrasound examination of the thyroid gland and adjacent soft
tissues was performed.

[Series 1: us thyroid · 13 of 66 slices shown]
[im 1/66]
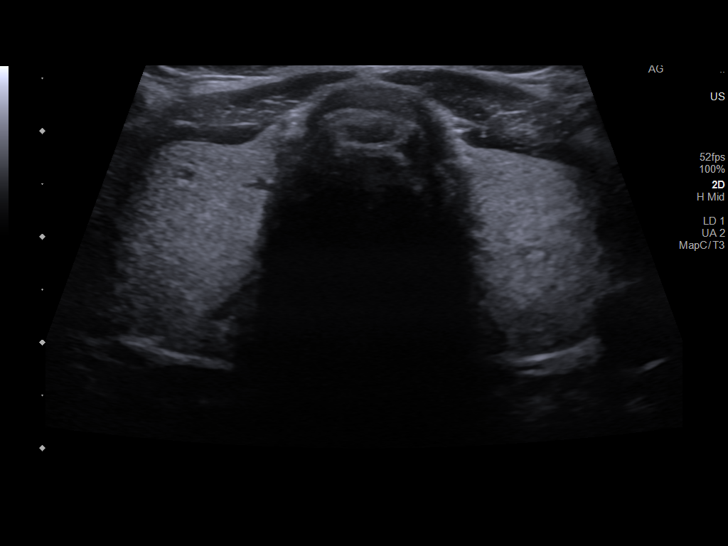
[im 6/66]
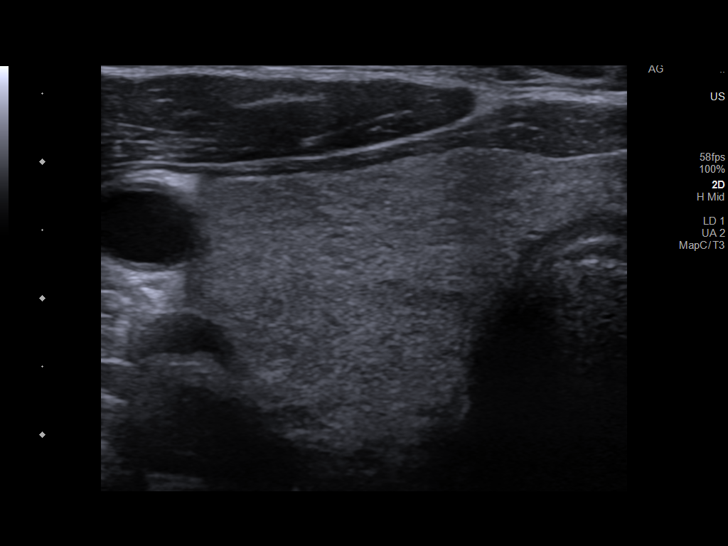
[im 11/66]
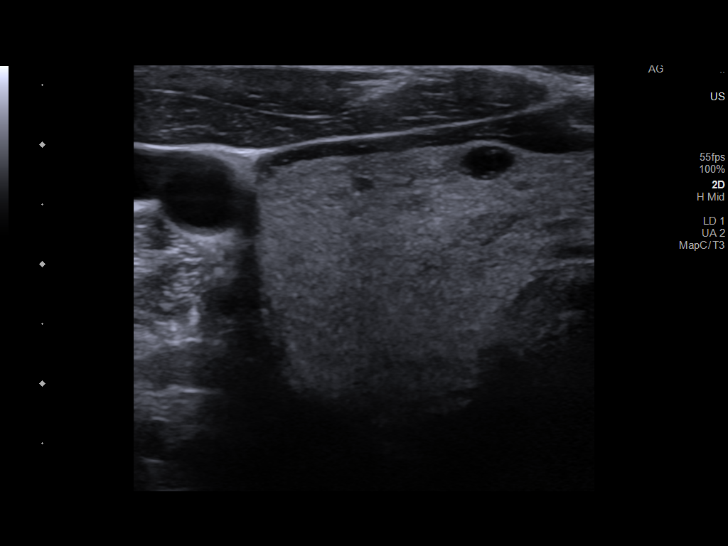
[im 17/66]
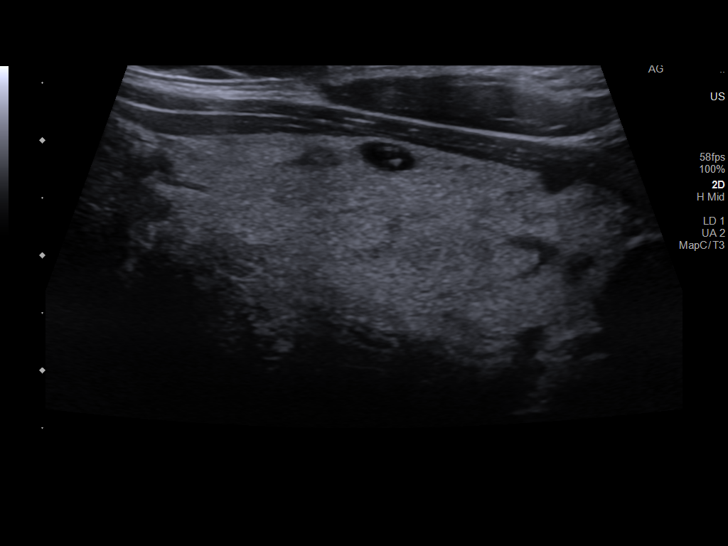
[im 22/66]
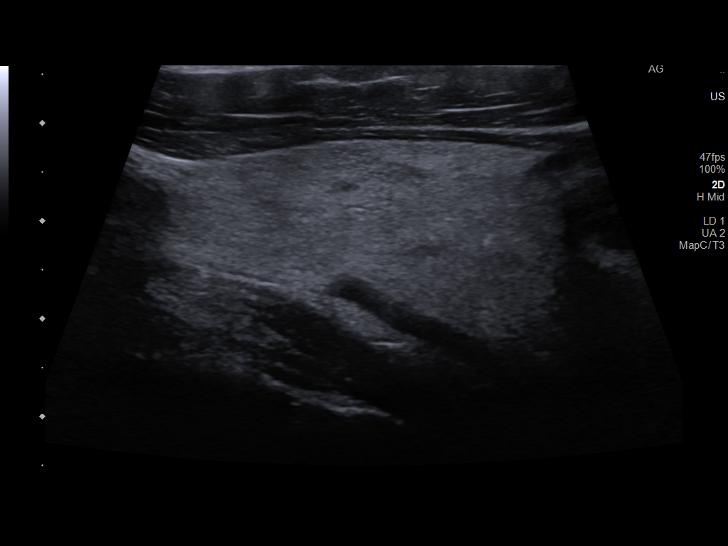
[im 28/66]
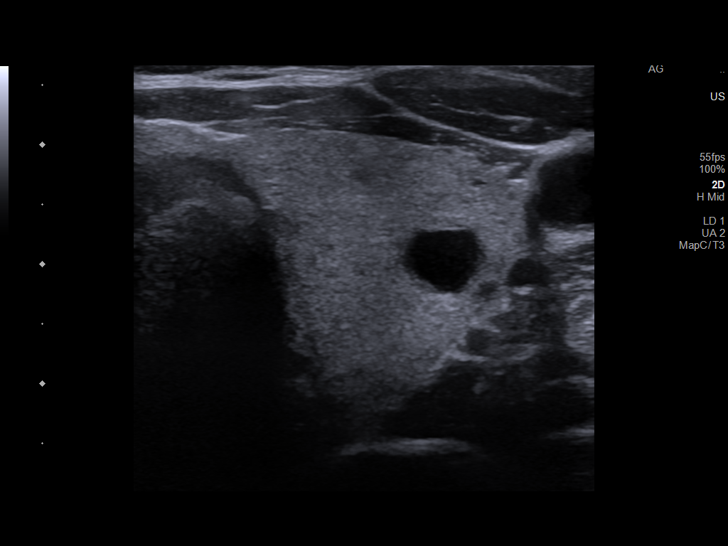
[im 33/66]
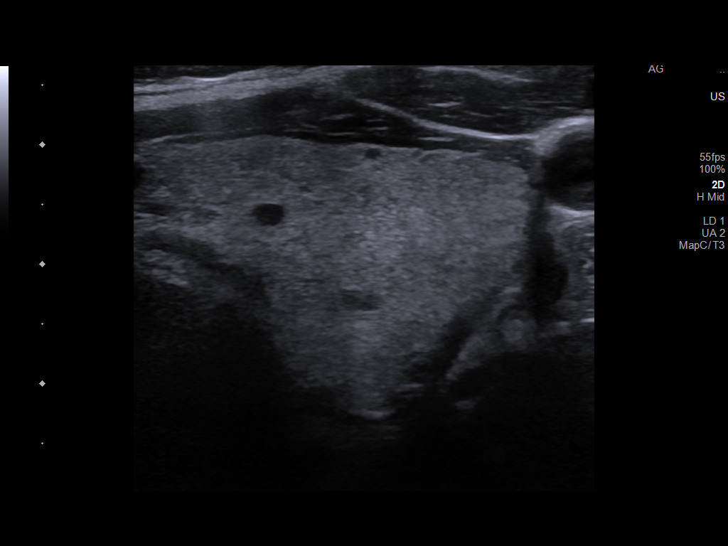
[im 38/66]
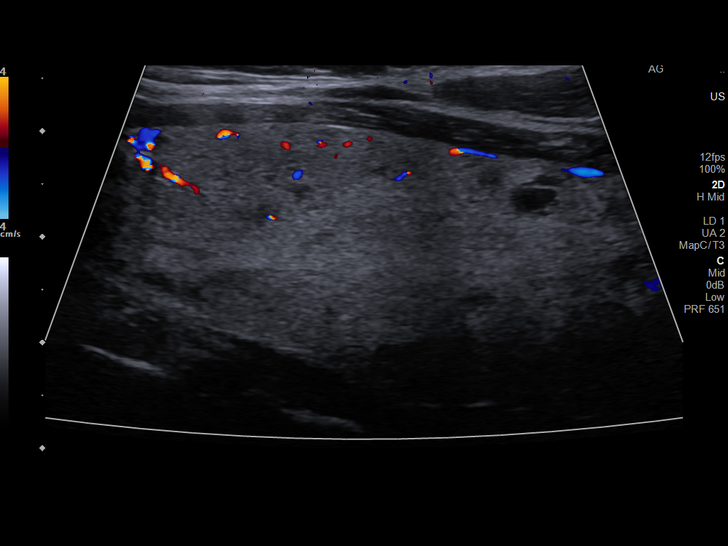
[im 44/66]
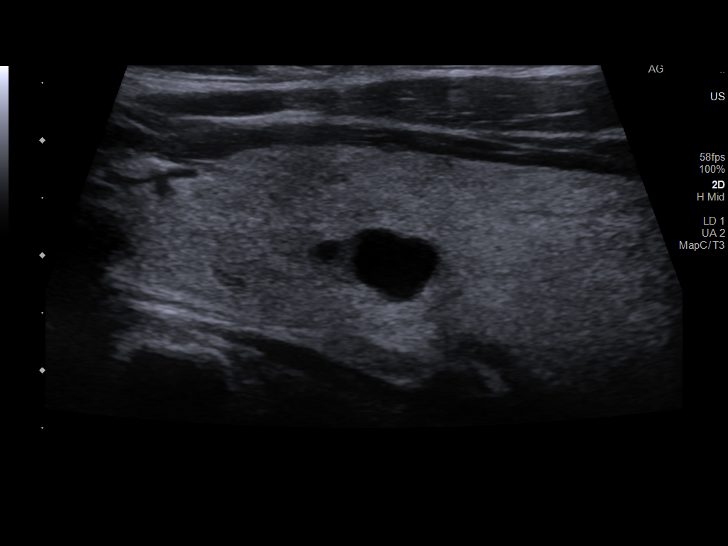
[im 49/66]
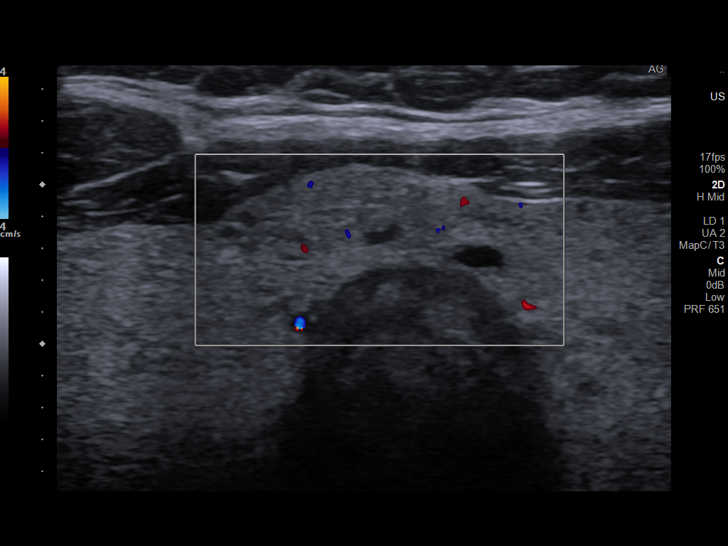
[im 55/66]
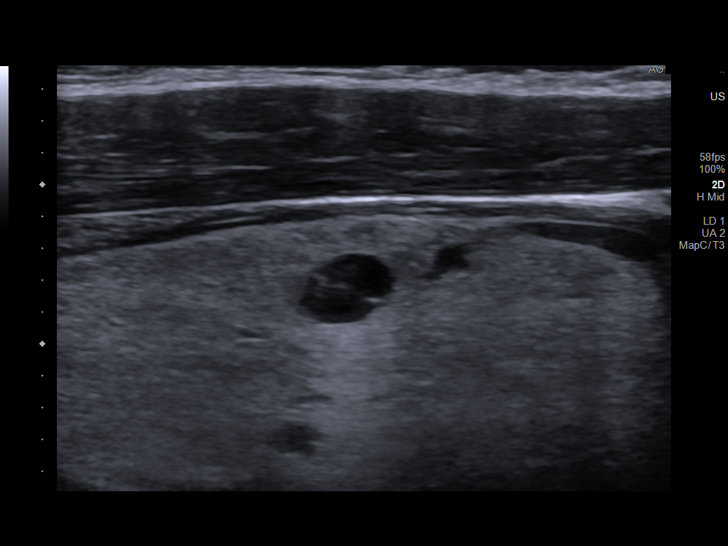
[im 60/66]
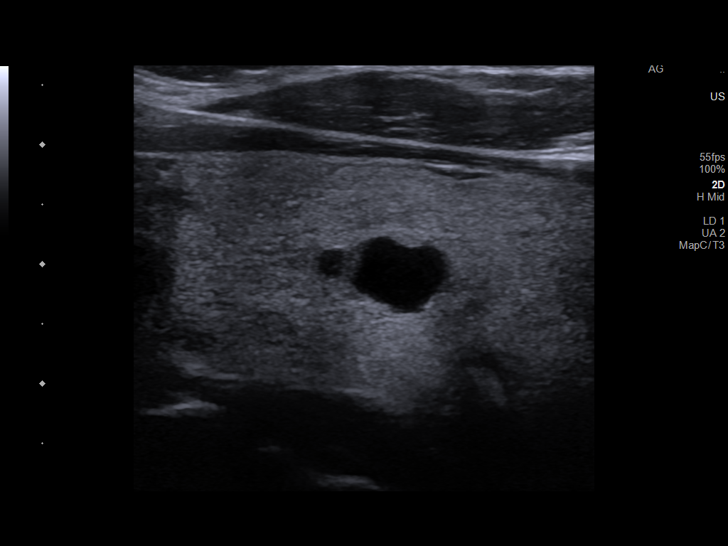
[im 66/66]
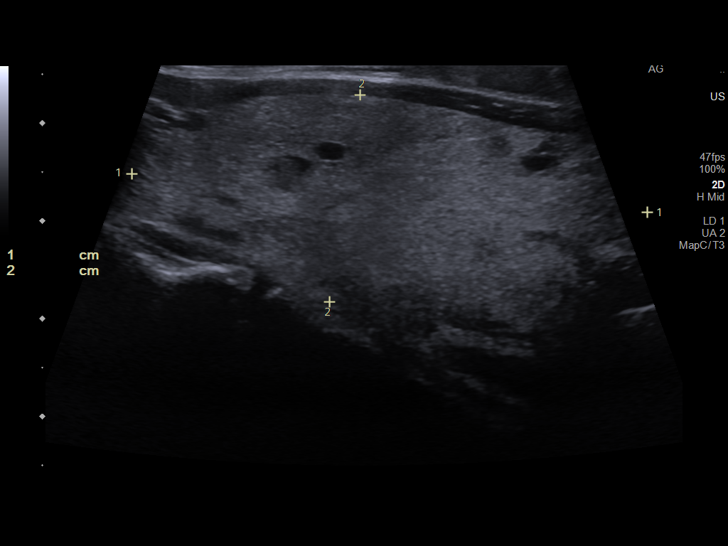

[13 of 25 positions shown; findings below may reference images not displayed]

FINDINGS: Parenchymal Echotexture: Mildly heterogenous

Isthmus: 0.7 cm thickness

Right lobe: 5.5 x 2.3 x 2.4 cm, previously 5.7 x 2.9 x

Left lobe: 5.2 x 2.1 x 2.3 cm, previously 5.5 x 2.6 x

_________________________________________________________

Estimated total number of nodules >/= 1 cm: 1

Number of spongiform nodules >/=  2 cm not described below (TR1): 0

Number of mixed cystic and solid nodules >/= 1.5 cm not described
below (TR2): 0

_________________________________________________________

0.5 cm benign colloid cyst, isthmus

0.6 cm complex cyst, superior right lobe; This nodule does NOT meet
TI-RADS criteria for biopsy or dedicated follow-up.

1.2 cm complex cyst, mid left lobe, previously 1.1 cm; This nodule
does NOT meet TI-RADS criteria for biopsy or dedicated follow-up.

No regional cervical adenopathy identified.
IMPRESSION: 1. Borderline thyromegaly with small bilateral lesions. None meets
criteria for biopsy or follow-up.

The above is in keeping with the ACR TI-RADS recommendations - [HOSPITAL] 2744;[DATE].

## 2022-12-27 ENCOUNTER — Ambulatory Visit: Payer: BC Managed Care – PPO | Admitting: Family Medicine

## 2023-01-14 ENCOUNTER — Other Ambulatory Visit: Payer: Self-pay | Admitting: Family Medicine

## 2023-01-17 ENCOUNTER — Ambulatory Visit: Payer: BC Managed Care – PPO | Admitting: Family Medicine

## 2023-02-01 LAB — CMP14+EGFR
ALT: 9 IU/L (ref 0–32)
AST: 13 IU/L (ref 0–40)
Albumin/Globulin Ratio: 1.4 (ref 1.2–2.2)
Albumin: 4 g/dL (ref 3.8–4.9)
Alkaline Phosphatase: 81 IU/L (ref 44–121)
BUN/Creatinine Ratio: 13 (ref 9–23)
BUN: 11 mg/dL (ref 6–24)
Bilirubin Total: 0.4 mg/dL (ref 0.0–1.2)
CO2: 22 mmol/L (ref 20–29)
Calcium: 9.2 mg/dL (ref 8.7–10.2)
Chloride: 102 mmol/L (ref 96–106)
Creatinine, Ser: 0.84 mg/dL (ref 0.57–1.00)
Globulin, Total: 2.8 g/dL (ref 1.5–4.5)
Glucose: 71 mg/dL (ref 70–99)
Potassium: 4.3 mmol/L (ref 3.5–5.2)
Sodium: 140 mmol/L (ref 134–144)
Total Protein: 6.8 g/dL (ref 6.0–8.5)
eGFR: 84 mL/min/{1.73_m2} (ref >59)

## 2023-02-01 LAB — CBC
Hematocrit: 39.9 % (ref 34.0–46.6)
Hemoglobin: 13.4 g/dL (ref 11.1–15.9)
MCH: 30 pg (ref 26.6–33.0)
MCHC: 33.6 g/dL (ref 31.5–35.7)
MCV: 89 fL (ref 79–97)
Platelets: 343 10*3/uL (ref 150–450)
RBC: 4.47 x10E6/uL (ref 3.77–5.28)
RDW: 12.4 % (ref 11.7–15.4)
WBC: 6.3 10*3/uL (ref 3.4–10.8)

## 2023-02-01 LAB — LIPID PANEL
Chol/HDL Ratio: 2.2 ratio (ref 0.0–4.4)
Cholesterol, Total: 182 mg/dL (ref 100–199)
HDL: 84 mg/dL (ref >39)
LDL Chol Calc (NIH): 82 mg/dL (ref 0–99)
Triglycerides: 88 mg/dL (ref 0–149)
VLDL Cholesterol Cal: 16 mg/dL (ref 5–40)

## 2023-02-01 LAB — VITAMIN D 25 HYDROXY (VIT D DEFICIENCY, FRACTURES): Vit D, 25-Hydroxy: 28.6 ng/mL — ABNORMAL LOW (ref 30.0–100.0)

## 2023-02-01 LAB — TSH: TSH: 0.593 u[IU]/mL (ref 0.450–4.500)

## 2023-02-07 ENCOUNTER — Encounter: Payer: Self-pay | Admitting: Family Medicine

## 2023-02-07 ENCOUNTER — Ambulatory Visit: Payer: BC Managed Care – PPO | Admitting: Family Medicine

## 2023-02-07 VITALS — BP 115/75 | HR 77 | Ht 59.0 in | Wt 184.1 lb

## 2023-02-07 DIAGNOSIS — K219 Gastro-esophageal reflux disease without esophagitis: Secondary | ICD-10-CM | POA: Diagnosis not present

## 2023-02-07 DIAGNOSIS — F5104 Psychophysiologic insomnia: Secondary | ICD-10-CM

## 2023-02-07 DIAGNOSIS — E559 Vitamin D deficiency, unspecified: Secondary | ICD-10-CM

## 2023-02-07 MED ORDER — SEMAGLUTIDE-WEIGHT MANAGEMENT 1 MG/0.5ML ~~LOC~~ SOAJ: 1.0000 mg | SUBCUTANEOUS | 0 refills | Status: DC

## 2023-02-07 MED ORDER — WEGOVY 0.25 MG/0.5ML ~~LOC~~ SOAJ: 0.2500 mg | SUBCUTANEOUS | 0 refills | Status: DC

## 2023-02-07 MED ORDER — ALPRAZOLAM 1 MG PO TABS: 1.0000 mg | ORAL_TABLET | Freq: Every day | ORAL | 5 refills | Status: AC

## 2023-02-07 MED ORDER — WEGOVY 0.25 MG/0.5ML ~~LOC~~ SOAJ: SUBCUTANEOUS | 0 refills | Status: DC

## 2023-02-07 NOTE — Progress Notes (Signed)
   [name] [name]     MRN: [id number]      DOB: 08-Feb-1971  Chief Complaint  Patient presents with   Follow-up    L ankle/foot soreness     HPI [name] [name] is here for follow up and re-evaluation of chronic medical conditions, medication management and review of any available recent lab and radiology data.  Preventive health is updated, specifically  Cancer screening and Immunization.   Questions or concerns regarding consultations or procedures which the PT has had in the interim are  addressed. The PT denies any adverse reactions to current medications since the last visit.  Needs Gyne referral doesn not want HIV testing Left ankle pain x 2 weeks after wearing heels on Mother's day Wants to resume med for weight loss if available, states she did well on it Just resuming walking for exercise today  ROS Denies recent fever or chills. Denies sinus pressure, nasal congestion, ear pain or sore throat. Denies chest congestion, productive cough or wheezing. Denies chest pains, palpitations and leg swelling Denies abdominal pain, nausea, vomiting,diarrhea or constipation.   Denies dysuria, frequency, hesitancy or incontinence.  Denies headaches, seizures, numbness, or tingling. Denies depression, uncontrolled anxiety or insomnia. Denies skin break down or rash.   PE  BP 115/75 (BP Location: Right Arm, Patient Position: Sitting, Cuff Size: Large)   Pulse 77   Ht 4\' 11"  (1.499 m)   Wt 184 lb 1.9 oz (83.5 kg)   LMP 11/17/2015   SpO2 98%   BMI 37.19 kg/m   Patient alert and oriented and in no cardiopulmonary distress.  HEENT: No facial asymmetry, EOMI,     Neck supple .  Chest: Clear to auscultation bilaterally.  CVS: S1, S2 no murmurs, no S3.Regular rate.  ABD: Soft non tender.   Ext: No edema  MS: Adequate ROM spine, shoulders, hips and knees.  Skin: Intact, no ulcerations or rash noted.  Psych: Good eye contact, normal affect. Memory intact not anxious or depressed  appearing.  CNS: CN 2-12 intact, power,  normal throughout.no focal deficits noted.   Assessment & Plan  Vitamin D deficiency Recommend OTC vit D3 1000 IU daily  Insomnia Sleep hygiene reviewed and written information offered also. Prescription sent for  medication needed.   Morbid obesity (HCC) Unchanged resume wegovy  Patient re-educated about  the importance of commitment to a  minimum of 150 minutes of exercise per week as able.  The importance of healthy food choices with portion control discussed, as well as eating regularly and within a 12 hour window most days. The need to choose "clean , green" food 50 to 75% of the time is discussed, as well as to make water the primary drink and set a goal of 64 ounces water daily.       02/07/2023    4:08 PM 08/04/2022    1:37 PM 06/29/2022    4:21 PM  Weight /BMI  Weight 184 lb 1.9 oz 177 lb 0.6 oz 175 lb  Height 4\' 11"  (1.499 m) 4\' 10"  (1.473 m) 4\' 10"  (1.473 m)  BMI 37.19 kg/m2 37 kg/m2 36.58 kg/m2      GERD Controlled, no change in medication

## 2023-02-07 NOTE — Patient Instructions (Addendum)
F/U in 11 weeks, call if you need me sooner  Newis wegovy starting at 0.25 mg weekly and every month the dose will increase  Commit to calcium 1200 mg per day and vit D3, 1000 IU daily  You will be referred to [facility] for exam  It is important that you exercise regularly at least 30 minutes 5 times a week. If you develop chest pain, have severe difficulty breathing, or feel very tired, stop exercising immediately and seek medical attention   Think about what you will eat, plan ahead. Choose " clean, green, fresh or frozen" over canned, processed or packaged foods which are more sugary, salty and fatty. 70 to 75% of food eaten should be vegetables and fruit. Three meals at set times with snacks allowed between meals, but they must be fruit or vegetables. Aim to eat over a 12 hour period , example 7 am to 7 pm, and STOP after  your last meal of the day. Drink water,generally about 64 ounces per day, no other drink is as healthy. Fruit juice is best enjoyed in a healthy way, by EATING the fruit. Thanks for choosing [facility], we consider it a privelige to serve you.

## 2023-02-07 NOTE — Assessment & Plan Note (Signed)
Controlled, no change in medication  

## 2023-02-07 NOTE — Assessment & Plan Note (Signed)
Recommend OTC vit D3 1000 IU daily

## 2023-02-07 NOTE — Assessment & Plan Note (Signed)
Unchanged resume wegovy  Patient re-educated about  the importance of commitment to a  minimum of 150 minutes of exercise per week as able.  The importance of healthy food choices with portion control discussed, as well as eating regularly and within a 12 hour window most days. The need to choose "clean , green" food 50 to 75% of the time is discussed, as well as to make water the primary drink and set a goal of 64 ounces water daily.       02/07/2023    4:08 PM 08/04/2022    1:37 PM 06/29/2022    4:21 PM  Weight /BMI  Weight 184 lb 1.9 oz 177 lb 0.6 oz 175 lb  Height 4\' 11"  (1.499 m) 4\' 10"  (1.473 m) 4\' 10"  (1.473 m)  BMI 37.19 kg/m2 37 kg/m2 36.58 kg/m2

## 2023-02-07 NOTE — Assessment & Plan Note (Signed)
Sleep hygiene reviewed and written information offered also. Prescription sent for  medication needed.  

## 2023-04-04 ENCOUNTER — Encounter (HOSPITAL_COMMUNITY): Payer: Self-pay | Admitting: *Deleted

## 2023-04-25 ENCOUNTER — Ambulatory Visit: Payer: BC Managed Care – PPO | Admitting: Family Medicine

## 2023-04-25 ENCOUNTER — Encounter: Payer: Self-pay | Admitting: Family Medicine

## 2023-04-25 DIAGNOSIS — Z1231 Encounter for screening mammogram for malignant neoplasm of breast: Secondary | ICD-10-CM

## 2023-04-25 DIAGNOSIS — E559 Vitamin D deficiency, unspecified: Secondary | ICD-10-CM

## 2023-04-25 DIAGNOSIS — Z01419 Encounter for gynecological examination (general) (routine) without abnormal findings: Secondary | ICD-10-CM

## 2023-04-25 DIAGNOSIS — F411 Generalized anxiety disorder: Secondary | ICD-10-CM | POA: Diagnosis not present

## 2023-04-25 DIAGNOSIS — E042 Nontoxic multinodular goiter: Secondary | ICD-10-CM

## 2023-04-25 DIAGNOSIS — B369 Superficial mycosis, unspecified: Secondary | ICD-10-CM | POA: Diagnosis not present

## 2023-04-25 NOTE — Assessment & Plan Note (Signed)
Vit d , 1000 I. U daily recommended

## 2023-04-25 NOTE — Assessment & Plan Note (Signed)
Controlled with topical med as needed, continue same

## 2023-04-25 NOTE — Assessment & Plan Note (Signed)
Most recent imaging in 2023 recommended that no f/u needed unless clinically indicated

## 2023-04-25 NOTE — Progress Notes (Signed)
   [name] [name]     MRN: [id number]      DOB: 11-Dec-1970  Chief Complaint  Patient presents with   Follow-up    Follow up concerned with a bump behind R knee    HPI [name] is here for follow up and re-evaluation of chronic medical conditions, medication management and review of any available recent lab and radiology data.  Preventive health is updated, specifically  Cancer screening and Immunization.  Needs pelvic exam Starting new strategywith exercise and requests nutrition consult re weight loss, medication not covered and intolerant of phentermine   ROS Denies recent fever or chills. Denies sinus pressure, nasal congestion, ear pain or sore throat. Denies chest congestion, productive cough or wheezing. Denies chest pains, palpitations and leg swelling Denies abdominal pain, nausea, vomiting,diarrhea or constipation.   Denies dysuria, frequency, hesitancy or incontinence. Denies joint pain, swelling and limitation in mobility. Denies headaches, seizures, numbness, or tingling. Denies depression, anxiety or insomnia. C/o itchy nodule on back of right knee x 1 week PE  BP 136/79 (BP Location: Right Arm, Patient Position: Sitting, Cuff Size: Large)   Pulse 93   Ht 4\' 11"  (1.499 m)   Wt 185 lb 1.3 oz (84 kg)   LMP 11/17/2015   SpO2 98%   BMI 37.38 kg/m   Patient alert and oriented and in no cardiopulmonary distress.  HEENT: No facial asymmetry, EOMI,     Neck supple .Goiter  Chest: Clear to auscultation bilaterally.  CVS: S1, S2 no murmurs, no S3.Regular rate.  ABD: Soft non tender.   Ext: No edema  MS: Adequate ROM spine, shoulders, hips and knees.  Skin: Intact, non tender erythematous SQ nodule on posterior right knee approx size Psych: Good eye contact, normal affect. Memory intact not anxious or depressed appearing.  CNS: CN 2-12 intact, power,  normal throughout.no focal deficits noted.   Assessment & Plan  Morbid obesity [facility])  Patient re-educated  about  the importance of commitment to a  minimum of 150 minutes of exercise per week as able.  The importance of healthy food choices with portion control discussed, as well as eating regularly and within a 12 hour window most days. The need to choose "clean , green" food 50 to 75% of the time is discussed, as well as to make water the primary drink and set a goal of 64 ounces water daily.       04/25/2023    4:16 PM 02/07/2023    4:08 PM 08/04/2022    1:37 PM  Weight /BMI  Weight 185 lb 1.3 oz 184 lb 1.9 oz 177 lb 0.6 oz  Height 4\' 11"  (1.499 m) 4\' 11"  (1.499 m) 4\' 10"  (1.473 m)  BMI 37.38 kg/m2 37.19 kg/m2 37 kg/m2    Unchanged, will be more consistent with lifestyle  modification  Dermatomycosis Controlled with topical med as needed, continue same  ANXIETY DISORDER, GENERALIZED Controlled, no change in medication   Multinodular goiter Most recent imaging in 2023 recommended that no f/u needed unless clinically indicated  Vitamin D deficiency Vit d , 1000 I. U daily recommended

## 2023-04-25 NOTE — Assessment & Plan Note (Signed)
  Patient re-educated about  the importance of commitment to a  minimum of 150 minutes of exercise per week as able.  The importance of healthy food choices with portion control discussed, as well as eating regularly and within a 12 hour window most days. The need to choose "clean , green" food 50 to 75% of the time is discussed, as well as to make water the primary drink and set a goal of 64 ounces water daily.       04/25/2023    4:16 PM 02/07/2023    4:08 PM 08/04/2022    1:37 PM  Weight /BMI  Weight 185 lb 1.3 oz 184 lb 1.9 oz 177 lb 0.6 oz  Height 4\' 11"  (1.499 m) 4\' 11"  (1.499 m) 4\' 10"  (1.473 m)  BMI 37.38 kg/m2 37.19 kg/m2 37 kg/m2    Unchanged, will be more consistent with lifestyle  modification

## 2023-04-25 NOTE — Assessment & Plan Note (Signed)
Controlled, no change in medication  

## 2023-04-25 NOTE — Patient Instructions (Addendum)
F/U in 4.5 months , call if you need me sooner'  You are referre tioo Gyne for pelvic and pap in [facility] female Provider    No med  changes  Pls schedule mammogram at checkout  It is important that you exercise regularly at least 30 minutes 5 times a week. If you develop chest pain, have severe difficulty breathing, or feel very tired, stop exercising immediately and seek medical attention   Think about what you will eat, plan ahead. Choose " clean, green, fresh or frozen" over canned, processed or packaged foods which are more sugary, salty and fatty. 70 to 75% of food eaten should be vegetables and fruit. Three meals at set times with snacks allowed between meals, but they must be fruit or vegetables. Aim to eat over a 12 hour period , example 7 am to 7 pm, and STOP after  your last meal of the day. Drink water,generally about 64 ounces per day, no other drink is as healthy. Fruit juice is best enjoyed in a healthy way, by EATING the fruit.  You are referred for nutrition therapy

## 2023-05-31 ENCOUNTER — Ambulatory Visit: Payer: BC Managed Care – PPO | Admitting: Skilled Nursing Facility1

## 2023-06-15 ENCOUNTER — Encounter: Payer: Self-pay | Admitting: Dietician

## 2023-06-15 ENCOUNTER — Encounter: Payer: BC Managed Care – PPO | Attending: Family Medicine | Admitting: Dietician

## 2023-06-15 DIAGNOSIS — Z713 Dietary counseling and surveillance: Secondary | ICD-10-CM | POA: Diagnosis not present

## 2023-06-15 DIAGNOSIS — Z6839 Body mass index (BMI) 39.0-39.9, adult: Secondary | ICD-10-CM | POA: Diagnosis not present

## 2023-06-15 DIAGNOSIS — E669 Obesity, unspecified: Secondary | ICD-10-CM

## 2023-06-15 NOTE — Progress Notes (Signed)
Medical Nutrition Therapy / Status Post Bariatric Surgery  Appointment Start time:  3:15  Appointment End time:  4:17  Primary concerns today: Weight gain after bariatric surgery  Referral diagnosis: obesity Preferred learning style: no preference indicated (auditory, visual, hands on, no preference indicated) Learning readiness: preparation (not ready, contemplating, ready, change in progress)  NUTRITION ASSESSMENT   Anthropometrics Start weight at [facility]: 216 lbs on 05/04/2016, 222.2 lbs on 08/01/16  Height:  Weight today:  Clinical Medical Hx: sleeve gastrectomy, Medications: Xanax, prevacid, Vit D, Calcium Labs: Vit D 28.6;  Notable Signs/Symptoms: none noted  Body Composition Scale 06/15/2023  Current Body Weight 188.6  Total Body Fat % 43.5  Visceral Fat 15  Fat-Free Mass % 56.4   Total Body Water % 42.7  Muscle-Mass lbs 25.6  BMI 39.1  Body Fat Displacement          Torso  lbs 50.8         Left Leg  lbs 10.1         Right Leg  lbs 10.1         Left Arm  lbs 5.0         Right Arm  lbs 5.0   Lifestyle & Dietary Hx  Pt states she does not keep sodas in her house, stating she has to go out to get one when she does. Pt states it will only be part of the soda. Pt states she is intermittent fasting right now. Pt states her surgeon is retired now, stating she chose not to follow-up with the surgeon's office. Pt states she has been seeing a nutritionist and life coach before her PCP referred her to [facility]/dietitian. Pt states she may follow-up with the [facility] program to see if she can qualify/finish, stating she started it soon after surgery, but didn't finish. Pt states she would like to follow-up in one month for accountability.  Estimated daily fluid intake: 40 oz Supplements: vit D Protein intake: not tracking Sleep: not well; pt states she is working on her sleep, stating she is sleeping but not resting Stress / self-care: breathing, write/think gratitude list Current  average weekly physical activity: ADLs  24-Hr Dietary Recall First Meal: water Snack: 11:00 chips or NABS or almonds or protein shake Second Meal: 1-2 pm  chicken (grilled), 1/2 bun, lettuce, tomato, sweet potato fries, water, 6 oz of Dr. Reino Kent or pinto beans and turnip greens, fried potatoes and onions Snack:  Third Meal: chicken steak with peppers, onion, cheese (as a sandwich (one slice of bread) Snack: outshine bar/ [employer]ers carb smart vanilla ice cream. Beverages: water, Dr. Reino Kent or [facility]  Estimated Energy Needs Calories: 1500  NUTRITION DIAGNOSIS  Overweight/obesity ([place]-3.3) related to past poor dietary habits and physical inactivity as evidenced by completed bariatric surgery and following dietary guidelines for continued weight loss and healthy nutrition status.  NUTRITION INTERVENTION  Nutrition education (E-1) on the following topics:  Encouraged patient to honor their body's internal hunger and fullness cues.  Throughout the day, check in mentally and rate hunger. Stop eating when satisfied not full regardless of how much food is left on the plate.  Get more if still hungry 20-30 minutes later.  The key is to honor satisfaction so throughout the meal, rate fullness factor and stop when comfortably satisfied not physically full. The key is to honor hunger and fullness without any feelings of guilt or shame.  Pay attention to what the internal cues are, rather than any external  factors. This will enhance the confidence you have in listening to your own body and following those internal cues enabling you to increase how often you eat when you are hungry not out of appetite and stop when you are satisfied not full.  Encouraged pt to continue to eat balanced meals inclusive of non starchy vegetables 2 times a day 7 days a week Encouraged pt to choose lean protein sources: limiting beef, pork, sausage, hotdogs, and lunch meat Encourage pt to choose healthy fats such as plant based  limiting animal fats Encouraged pt to continue to drink a minium 64 fluid ounces with half being plain water to satisfy proper hydration Encouraged pt to aim for 150 minutes of intentional physical activity per week.   Encouraged pt to eat throughout the day; schedule meals and snacks; separate fluids from her meals.  Handouts Provided Include  [facility] flyer/information Bariatric Weight loss strategies packet/ Maintenance Plan  Learning Style & Readiness for Change Teaching method utilized: Visual & Auditory  Demonstrated degree of understanding via: Teach Back  Barriers to learning/adherence to lifestyle change: nothing identified  Goals Established by Pt Start taking multivitamin and calcium again Aim for 64 oz of fluid Track your protein; aim for 60 grams a day  MONITORING & EVALUATION Dietary intake, weekly physical activity, body composition.  Next Steps  Patient is to return to [facility] in one month for follow-up.

## 2023-07-20 ENCOUNTER — Encounter: Payer: BC Managed Care – PPO | Attending: Family Medicine | Admitting: Dietician

## 2023-07-20 ENCOUNTER — Encounter: Payer: Self-pay | Admitting: Dietician

## 2023-07-20 VITALS — Ht <= 58 in | Wt 188.0 lb

## 2023-07-20 DIAGNOSIS — E669 Obesity, unspecified: Secondary | ICD-10-CM | POA: Diagnosis present

## 2023-07-20 NOTE — Progress Notes (Signed)
Bariatric Nutrition Follow-Up Visit Medical Nutrition Therapy  Appt Start Time: 4:49   End Time: 5:23  Post-Operative Sleeve Surgery Surgery Date: 08/15/2016  NUTRITION ASSESSMENT  Anthropometrics Start weight at [facility]: 216 lbs on 05/04/2016, 222.2 lbs on 08/01/16  Height: 58 in Weight today:188.0 lbs  Clinical Medical Hx: sleeve gastrectomy, Medications: Xanax, prevacid, Vit D, Calcium Labs: Vit D 28.6;  Notable Signs/Symptoms: none noted  Body Composition Scale 06/15/2023 07/20/2023  Current Body Weight 188.6 188.0  Total Body Fat % 43.5 43.4  Visceral Fat 15 15  Fat-Free Mass % 56.4 45.5   Total Body Water % 42.7 42.7  Muscle-Mass lbs 25.6 25.6  BMI 39.1 39.0  Body Fat Displacement           Torso  lbs 50.8 50.4         Left Leg  lbs 10.1 10.0         Right Leg  lbs 10.1 10.0         Left Arm  lbs 5.0 5.0         Right Arm  lbs 5.0 5.0    Lifestyle & Dietary Hx  Pt states she started increasing physical activity the beginning of October with a group workout 2 days a week, for 60 minutes.  Pt states she is here today to stay accountable and stick to the goals and basics.  Estimated daily fluid intake: 32 oz Estimated daily protein intake: 60 g Supplements: multivitamin and calcium (increasing the number of days she takes the multivitamin)Current average weekly physical activity: group workout, 2 days a week, for 60 minutes.    24-Hr Dietary Recall First Meal: bacon egg and cheese or protein shakes Snack:   Second Meal: hot dog with half a bun (chili and slaw) Snack: skittles  Third Meal: Cook-out grilled chicken sandwich with lettuce wrap, cheese bits, fries or meat and vegetable Snack: protein shake Beverages: water, mountain dew  Post-Op Goals/ Signs/ Symptoms Using straws: no Drinking while eating: no (most of the time) Chewing/swallowing difficulties: no Changes in vision: no Changes to mood/headaches: no Hair loss/changes to skin/nails: no Difficulty  focusing/concentrating: no Sweating: no Limb weakness: no Dizziness/lightheadedness: no Palpitations: no  Carbonated/caffeinated beverages: no N/V/D/C/Gas: no Abdominal pain: no Dumping syndrome: no    NUTRITION DIAGNOSIS  Overweight/obesity (-3.3) related to past poor dietary habits and physical inactivity as evidenced by completed bariatric surgery and following dietary guidelines for continued weight loss and healthy nutrition status.     NUTRITION INTERVENTION Nutrition counseling (C-1) and education (E-2) to facilitate bariatric surgery goals, including: The importance of consuming adequate calories as well as certain nutrients daily due to the body's need for essential vitamins, minerals, and fats The importance of daily physical activity and to reach a goal of at least 150 minutes of moderate to vigorous physical activity weekly (or as directed by their physician) due to benefits such as increased musculature and improved lab values The importance of intuitive eating specifically learning hunger-satiety cues and understanding the importance of learning a new body: The importance of mindful eating to avoid grazing behaviors  Meal Prepping; create meal ideas to translate into shopping lists at the grocery store. Allows having food on hand that meat our goals.  Goals Established by Pt Start taking multivitamin and calcium again Aim for 64 oz of fluid Track your protein; aim for 60 grams a day Continue: increase physical activity New: meal plan and track intake  Handouts Provided Include  Meal Ideas Bariatric  MyPlate  Learning Style & Readiness for Change Teaching method utilized: Visual & Auditory  Demonstrated degree of understanding via: Teach Back  Readiness Level: ready Barriers to learning/adherence to lifestyle change: none identified  RD's Notes for Next Visit Assess adherence to pt chosen goals  MONITORING & EVALUATION Dietary intake, weekly physical activity,  body weight.  Next Steps Patient wants to return in 1 months for follow-up and accountability.

## 2023-07-24 ENCOUNTER — Other Ambulatory Visit (HOSPITAL_COMMUNITY)
Admission: RE | Admit: 2023-07-24 | Discharge: 2023-07-24 | Disposition: A | Discharge status: Home / Self Care | Payer: BC Managed Care – PPO | Source: Ambulatory Visit | Attending: Obstetrics and Gynecology | Admitting: Obstetrics and Gynecology

## 2023-07-24 ENCOUNTER — Ambulatory Visit (INDEPENDENT_AMBULATORY_CARE_PROVIDER_SITE_OTHER): Payer: BC Managed Care – PPO | Admitting: Obstetrics and Gynecology

## 2023-07-24 ENCOUNTER — Encounter: Payer: Self-pay | Admitting: Obstetrics and Gynecology

## 2023-07-24 VITALS — BP 118/80 | HR 63 | Ht <= 58 in | Wt 186.0 lb

## 2023-07-24 DIAGNOSIS — Z01419 Encounter for gynecological examination (general) (routine) without abnormal findings: Secondary | ICD-10-CM | POA: Insufficient documentation

## 2023-07-24 DIAGNOSIS — Z1231 Encounter for screening mammogram for malignant neoplasm of breast: Secondary | ICD-10-CM

## 2023-07-24 NOTE — Addendum Note (Signed)
Addended by: Earley Favor on: 07/24/2023 02:58 PM   Modules accepted: Orders

## 2023-07-24 NOTE — Progress Notes (Signed)
52 y.o. y.o. female here for annual exam.   Patient's last menstrual period was 11/17/2015.    Health Maintenance: MMG: 08/15/22 birads 1 Colonosocpy 02/10/21 neg repeat in 10 years (2033) No HRT use occasional hot flashes and not bothered by them. Declines any hormones Not sexually active TAH: ovaries intact. Reports for fibroids and bleeding and that pathology was benign Denies any abnormal pap smears Mother with colon cancer Dad with prostate cancer  Denies any pelvic pain, vaginal discharge or GU complaints   Pulse 63, height 4' 9.25" (1.454 m), weight 186 lb (84.4 kg), last menstrual period 11/17/2015, SpO2 99%.  No results found for: "DIAGPAP", "HPVHIGH", "ADEQPAP"  GYN HISTORY: No results found for: "DIAGPAP", "HPVHIGH", "ADEQPAP"  OB History  Gravida Para Term Preterm AB Living  2 1 1   1 1   SAB IAB Ectopic Multiple Live Births  1            # Outcome Date GA Lbr Len/2nd Weight Sex Type Anes PTL Lv  2 SAB           1 Term             Past Medical History:  Diagnosis Date   Anxiety    Bursitis    Bursitis    Fibroid    GERD (gastroesophageal reflux disease)    Hypertension    seen by [facility]   Palpitations    PONV (postoperative nausea and vomiting)    Venous insufficiency     Past Surgical History:  Procedure Laterality Date   ABDOMINAL HYSTERECTOMY  01/16/2016 [facility])   partial for fibroids   CESAREAN SECTION     COLONOSCOPY WITH PROPOFOL N/A 02/10/2021   Procedure: COLONOSCOPY WITH PROPOFOL;  Surgeon: Malissa Hippo, MD;  Location: [facility];  Service: Endoscopy;  Laterality: N/A;  11:00   DILATION AND CURETTAGE OF UTERUS     HYSTEROSCOPY WITH D & C  04/02/2012   Procedure: DILATATION AND CURETTAGE /HYSTEROSCOPY;  Surgeon: Serita Kyle, MD;  Location: [facility];  Service: Gynecology;  Laterality: N/A;   LAPAROSCOPIC GASTRIC SLEEVE RESECTION WITH HIATAL HERNIA REPAIR N/A 08/15/2016   Procedure: LAPAROSCOPIC  GASTRIC SLEEVE RESECTION WITH HIATAL HERNIA REPAIR WITH UPPER ENDO;  Surgeon: Luretha Murphy, MD;  Location: [facility] ORS;  Service: General;  Laterality: N/A;   LAPAROSCOPIC TUBAL LIGATION  04/02/2012   Procedure: LAPAROSCOPIC TUBAL LIGATION;  Surgeon: Serita Kyle, MD;  Location: [facility];  Service: Gynecology;  Laterality: Bilateral;   TRANSTHORACIC ECHOCARDIOGRAM  01/11/2009   EF >55%, normal    Current Outpatient Medications on File Prior to Visit  Medication Sig Dispense Refill   acetaminophen (TYLENOL) 500 MG tablet Take 1,000 mg by mouth every 6 (six) hours as needed for moderate pain or headache.     ALPRAZolam (XANAX) 1 MG tablet Take 1 tablet (1 mg total) by mouth at bedtime. 30 tablet 5   clotrimazole-betamethasone (LOTRISONE) cream Apply twice daily to affected area for 1 week, then as needed 45 g 1   fluticasone (FLONASE) 50 MCG/ACT nasal spray SHAKE LIQUID AND USE 2 SPRAYS IN EACH NOSTRIL DAILY 16 g 6   Multiple Vitamin (MULTIVITAMIN PO) Take by mouth.     NYSTATIN powder Apply topically 2 (two) times daily. 45 g 1   omeprazole (PRILOSEC) 40 MG capsule TAKE 1 CAPSULE(40 MG) BY MOUTH DAILY 90 capsule 1   Propylene Glycol (SYSTANE BALANCE) 0.6 % SOLN Place 1 drop into both eyes  daily as needed (dry eyes).     RESTASIS 0.05 % ophthalmic emulsion Place 1 drop into both eyes 2 (two) times daily.     No current facility-administered medications on file prior to visit.    Social History   Socioeconomic History   Marital status: Married    Spouse name: Not on file   Number of children: Not on file   Years of education: Not on file   Highest education level: Not on file  Occupational History   Not on file  Tobacco Use   Smoking status: Never   Smokeless tobacco: Never  Substance and Sexual Activity   Alcohol use: Not Currently   Drug use: No   Sexual activity: Yes    Partners: Male    Birth control/protection: Surgical    Comment: hysterectomy  Other Topics Concern    Not on file  Social History Narrative   Not on file   Social Determinants of Health   Financial Resource Strain: Not on file  Food Insecurity: Not on file  Transportation Needs: Not on file  Physical Activity: Not on file  Stress: Not on file  Social Connections: Not on file  Intimate Partner Violence: Not on file    Family History  Problem Relation Age of Onset   Hypertension Mother    Cancer Father 59        prostate   Diabetes Father    Hypertension Father    Hypertension Sister    Diabetes Sister    Hypertension Sister    Diabetes Sister    Cancer Paternal Aunt 69       breast cancer   Breast cancer Paternal Aunt 12     Allergies  Allergen Reactions   Diltiazem Rash   Phentermine Palpitations    H/o non sustained v tach      Patient's last menstrual period was Patient's last menstrual period was 11/17/2015.Marland Kitchen            Review of Systems Alls systems reviewed and are negative.     OBGyn Exam    A:         Well Woman GYN exam                             P:        Pap smear not indicated low risk with hysterectomy and all normal paps prior.  She may want to do one next year. Encouraged annual mammogram screening Colon cancer screening up-to-date DXA not indicated Labs and immunizations to do with PMD Discussed breast self exams Encouraged healthy lifestyle practices Encouraged Vit D and Calcium   No follow-ups on file.  Earley Favor

## 2023-07-27 LAB — CYTOLOGY - PAP
Adequacy: ABSENT
Comment: NEGATIVE
Diagnosis: NEGATIVE
High risk HPV: NEGATIVE

## 2023-08-16 ENCOUNTER — Ambulatory Visit: Payer: BC Managed Care – PPO | Admitting: Dietician

## 2023-08-25 ENCOUNTER — Ambulatory Visit: Payer: BC Managed Care – PPO | Admitting: Family Medicine

## 2023-08-25 ENCOUNTER — Encounter: Payer: Self-pay | Admitting: Family Medicine

## 2023-08-25 VITALS — BP 133/84 | HR 72 | Resp 16 | Ht <= 58 in | Wt 185.0 lb

## 2023-08-25 DIAGNOSIS — K219 Gastro-esophageal reflux disease without esophagitis: Secondary | ICD-10-CM

## 2023-08-25 DIAGNOSIS — B369 Superficial mycosis, unspecified: Secondary | ICD-10-CM | POA: Diagnosis not present

## 2023-08-25 DIAGNOSIS — F411 Generalized anxiety disorder: Secondary | ICD-10-CM

## 2023-08-25 MED ORDER — NALTREXONE-BUPROPION HCL ER 8-90 MG PO TB12: ORAL_TABLET | ORAL | 0 refills | Status: DC

## 2023-08-25 MED ORDER — NYSTATIN 100000 UNIT/GM EX POWD
Freq: Two times a day (BID) | CUTANEOUS | 1 refills | Status: DC
Start: 2023-08-25 — End: 2024-07-30

## 2023-08-25 MED ORDER — ALPRAZOLAM 1 MG PO TABS: 1.0000 mg | ORAL_TABLET | Freq: Every evening | ORAL | 5 refills | Status: DC | PRN

## 2023-08-25 NOTE — Assessment & Plan Note (Signed)
Controlled, no change in medication  

## 2023-08-25 NOTE — Patient Instructions (Addendum)
F/U in  7 weeks, call if you need me sooner  New for weight loss is contrave, message/ call by week 3 so I know if you can continue the medication. Maintainace dose is two twice daily  It is important that you exercise regularly at least 30 minutes 5 times a week. If you develop chest pain, have severe difficulty breathing, or feel very tired, stop exercising immediately and seek medical attention   Think about what you will eat, plan ahead. Choose " clean, green, fresh or frozen" over canned, processed or packaged foods which are more sugary, salty and fatty. 70 to 75% of food eaten should be vegetables and fruit. Three meals at set times with snacks allowed between meals, but they must be fruit or vegetables. Aim to eat over a 12 hour period , example 7 am to 7 pm, and STOP after  your last meal of the day. Drink water,generally about 64 ounces per day, no other drink is as healthy. Fruit juice is best enjoyed in a healthy way, by EATING the fruit.

## 2023-08-25 NOTE — Progress Notes (Signed)
   [name] [name]     MRN: [id number]      DOB: [birth year]  Chief Complaint  Patient presents with   Weight Loss    Wants to see if she can get something for weight loss. Wants to discuss contrave     HPI [name] [name] is here for follow up and re-evaluation of chronic medical conditions, medication management and review of any available recent lab and radiology data.  Preventive health is updated, specifically  Cancer screening and Immunization.   Questions or concerns regarding consultations or procedures which the PT has had in the interim are  addressed. The PT denies any adverse reactions to current medications since the last visit.  Requests medication to help with weight loss. Has heard of contrave  with good benefit and wants to try this also. She is exercising and is in a support group to help with weight loss   ROS Denies recent fever or chills. Denies sinus pressure, nasal congestion, ear pain or sore throat. Denies chest congestion, productive cough or wheezing. Denies chest pains, palpitations and leg swelling Denies abdominal pain, nausea, vomiting,diarrhea or constipation.   Denies dysuria, frequency, hesitancy or incontinence. Denies joint pain, swelling and limitation in mobility. Denies headaches, seizures, numbness, or tingling. Denies depression, uncontrolled  anxiety or insomnia. Denies skin break down or rash.   PE  BP 133/84   Pulse 72   Resp 16   Ht 4\' 10"  (1.473 m)   Wt 185 lb (83.9 kg)   LMP 11/17/2015   SpO2 97%   BMI 38.67 kg/m   Patient alert and oriented and in no cardiopulmonary distress.  HEENT: No facial asymmetry, EOMI,     Neck supple .  Chest: Clear to auscultation bilaterally.  CVS: S1, S2 no murmurs, no S3.Regular rate.  ABD: Soft non tender.   Ext: No edema  MS: Adequate ROM spine, shoulders, hips and knees.  Skin: Intact, no ulcerations or rash noted.  Psych: Good eye contact, normal affect. Memory intact not anxious or depressed  appearing.  CNS: CN 2-12 intact, power,  normal throughout.no focal deficits noted.   Assessment & Plan  ANXIETY DISORDER, GENERALIZED Controlled on current medication, same is refilled, for bedtime use only  GERD Controlled, no change in medication   Morbid obesity (HCC)  Patient re-educated about  the importance of commitment to a  minimum of 150 minutes of exercise per week as able. Start contrave , titrate up dose as tolerated to target of 4 tabs / day, weight loss goal of 1 pound per week  The importance of healthy food choices with portion control discussed, as well as eating regularly and within a 12 hour window most days. The need to choose "clean , green" food 50 to 75% of the time is discussed, as well as to make water the primary drink and set a goal of 64 ounces water daily.       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

## 2023-08-25 NOTE — Assessment & Plan Note (Signed)
Controlled on current medication, same is refilled, for bedtime use only

## 2023-08-25 NOTE — Assessment & Plan Note (Addendum)
Refill nystatin and clotrimazole/betamethasone due to recurrent flares

## 2023-08-25 NOTE — Assessment & Plan Note (Addendum)
  Patient re-educated about  the importance of commitment to a  minimum of 150 minutes of exercise per week as able. Start contrave , titrate up dose as tolerated to target of 4 tabs / day, weight loss goal of 1 pound per week  The importance of healthy food choices with portion control discussed, as well as eating regularly and within a 12 hour window most days. The need to choose "clean , green" food 50 to 75% of the time is discussed, as well as to make water the primary drink and set a goal of 64 ounces water daily.       08/25/2023    4:17 PM 07/24/2023   10:33 AM 07/20/2023    4:52 PM  Weight /BMI  Weight 185 lb 186 lb 188 lb  Height 4\' 10"  (1.473 m) 4' 9.25" (1.454 m) 4\' 10"  (1.473 m)  BMI 38.67 kg/m2 39.9 kg/m2 39.29 kg/m2

## 2023-09-08 ENCOUNTER — Ambulatory Visit
Admission: EM | Admit: 2023-09-08 | Discharge: 2023-09-08 | Disposition: A | Discharge status: Home / Self Care | Payer: BC Managed Care – PPO | Attending: Nurse Practitioner | Admitting: Nurse Practitioner

## 2023-09-08 DIAGNOSIS — J101 Influenza due to other identified influenza virus with other respiratory manifestations: Secondary | ICD-10-CM

## 2023-09-08 LAB — POC COVID19/FLU A&B COMBO
Covid Antigen, POC: NEGATIVE
Influenza A Antigen, POC: POSITIVE — AB
Influenza B Antigen, POC: NEGATIVE

## 2023-09-08 MED ORDER — PROMETHAZINE-DM 6.25-15 MG/5ML PO SYRP: 5.0000 mL | ORAL_SOLUTION | Freq: Four times a day (QID) | ORAL | 0 refills | Status: DC | PRN

## 2023-09-08 MED ORDER — OSELTAMIVIR PHOSPHATE 75 MG PO CAPS: 75.0000 mg | ORAL_CAPSULE | Freq: Two times a day (BID) | ORAL | 0 refills | Status: DC

## 2023-09-08 MED ORDER — FLUTICASONE PROPIONATE 50 MCG/ACT NA SUSP: 2.0000 | Freq: Every day | NASAL | 0 refills | Status: DC

## 2023-09-08 MED ORDER — ACETAMINOPHEN 325 MG PO TABS
650.0000 mg | ORAL_TABLET | Freq: Once | ORAL | Status: AC
  Administered 2023-09-08: 650 mg via ORAL

## 2023-09-08 NOTE — Discharge Instructions (Addendum)
You did test positive for influenza A. Take medication as prescribed. Increase fluids and allow for plenty of rest. May over-the-counter Tylenol as needed for pain, fever, general discomfort. Normal saline nasal spray for nasal congestion and runny nose.  You may use the nasal spray you have at home to help with the nasal congestion and runny nose. For the cough, recommend using a humidifier in the bedroom at nighttime during sleep and sleeping elevated on pillows while cough symptoms persist. As discussed, you should remain home until you have been fever free for 24 hours with no medication. Follow-up as needed.

## 2023-09-08 NOTE — ED Triage Notes (Signed)
Pt states congestion, cough,sinus pressure and chills for the past 2 days.  States she is taking alka seltzer cold at home.

## 2023-09-08 NOTE — ED Provider Notes (Signed)
[facility]    CSN: [id number] Arrival date & time: 09/08/23  1728      History   Chief Complaint Chief Complaint  Patient presents with   Nasal Congestion    HPI [name] [name] is a 52 y.o. female.   The history is provided by the patient.   Patient presents with 2-day history of fever, chills, body aches, nasal congestion, cough, sinus pressure, and fatigue.  Patient denies headache, ear pain, sore throat, wheezing, difficulty breathing, chest pain, abdominal pain, nausea, vomiting, diarrhea, or rash.  Patient reports she has been taking Alka-Seltzer for her symptoms.  She denies any obvious known sick contacts.  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 08/15/2016   Allergic sinusitis 03/14/2016   Intermittent palpitations 12/12/2015   Paroxysmal atrial tachycardia (HCC) 12/12/2015   Insomnia 11/23/2015   Knee pain, left 12/16/2014   Morbid obesity (HCC) 11/20/2012   Vitamin D deficiency 11/20/2012   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   ANXIETY DISORDER, GENERALIZED 07/13/2007   Allergic rhinitis 07/13/2007   GERD 07/13/2007    Past Surgical History:  Procedure Laterality Date   ABDOMINAL HYSTERECTOMY  01/16/2016 [facility])   partial for fibroids   CESAREAN SECTION     COLONOSCOPY WITH PROPOFOL N/A 02/10/2021   Procedure: COLONOSCOPY WITH PROPOFOL;  Surgeon: Malissa Hippo, MD;  Location: [facility];  Service: Endoscopy;  Laterality: N/A;  11:00   DILATION AND CURETTAGE OF UTERUS      HYSTEROSCOPY WITH D & C  04/02/2012   Procedure: DILATATION AND CURETTAGE /HYSTEROSCOPY;  Surgeon: Serita Kyle, MD;  Location: [facility];  Service: Gynecology;  Laterality: N/A;   LAPAROSCOPIC GASTRIC SLEEVE RESECTION WITH HIATAL HERNIA REPAIR N/A 08/15/2016   Procedure: LAPAROSCOPIC GASTRIC SLEEVE RESECTION WITH HIATAL HERNIA REPAIR WITH UPPER ENDO;  Surgeon: Luretha Murphy, MD;  Location: [facility];  Service: General;  Laterality: N/A;   LAPAROSCOPIC TUBAL LIGATION  04/02/2012   Procedure: LAPAROSCOPIC TUBAL LIGATION;  Surgeon: Serita Kyle, MD;  Location: [facility];  Service: Gynecology;  Laterality: Bilateral;   TRANSTHORACIC ECHOCARDIOGRAM  01/11/2009   EF >55%, normal    OB History     Gravida  2   Para  1   Term  1   Preterm      AB  1   Living  1      SAB  1   IAB      Ectopic      Multiple      Live Births               Home Medications    Prior to Admission medications   Medication Sig Start Date End Date Taking? Authorizing Provider  fluticasone (FLONASE) 50 MCG/ACT nasal spray Place 2 sprays into both nostrils daily. 09/08/23  Yes Leath-Warren, Sadie Haber, NP  oseltamivir (TAMIFLU) 75 MG capsule Take 1 capsule (75 mg total) by mouth every 12 (twelve) hours. 09/08/23  Yes Leath-Warren,  Sadie Haber, NP  promethazine-dextromethorphan (PROMETHAZINE-DM) 6.25-15 MG/5ML syrup Take 5 mLs by mouth 4 (four) times daily as needed. 09/08/23  Yes Leath-Warren, Sadie Haber, NP  acetaminophen (TYLENOL) 500 MG tablet Take 1,000 mg by mouth every 6 (six) hours as needed for moderate pain or headache.    [provider]  ALPRAZolam Prudy Feeler) 1 MG tablet Take 1 tablet (1 mg total) by mouth at bedtime as needed for anxiety. Take 1 mg by mouth at bedtime as needed for anxiety. 08/25/23   Kerri Perches, MD  clotrimazole-betamethasone (LOTRISONE) cream Apply twice daily to affected area for 1 week, then as needed 08/04/22   Kerri Perches, MD  Multiple  Vitamin (MULTIVITAMIN PO) Take by mouth.    [provider]  Naltrexone-buPROPion HCl ER 8-90 MG TB12 Start 1 tablet every morning for 7 days, then 1 tablet twice daily for 7 days, then 2 tablets every morning and one in the evening for 1 week , then 2 tablets twice daily 08/25/23   Kerri Perches, MD  NYSTATIN powder Apply topically 2 (two) times daily. 08/25/23   Kerri Perches, MD  omeprazole (PRILOSEC) 40 MG capsule TAKE 1 CAPSULE(40 MG) BY MOUTH DAILY 01/16/23   Kerri Perches, MD  Propylene Glycol (SYSTANE BALANCE) 0.6 % SOLN Place 1 drop into both eyes daily as needed (dry eyes).    [provider]  RESTASIS 0.05 % ophthalmic emulsion Place 1 drop into both eyes 2 (two) times daily. 07/16/13   [provider]    Family History Family History  Problem Relation Age of Onset   Hypertension Mother    Cancer Father 4        prostate   Diabetes Father    Hypertension Father    Hypertension Sister    Diabetes Sister    Hypertension Sister    Diabetes Sister    Cancer Paternal Aunt 41       breast cancer   Breast cancer Paternal Aunt 80    Social History Social History   Tobacco Use   Smoking status: Never   Smokeless tobacco: Never  Substance Use Topics   Alcohol use: Not Currently   Drug use: No     Allergies   Diltiazem and Phentermine   Review of Systems Review of Systems Per HPI  Physical Exam Triage Vital Signs ED Triage Vitals  Encounter Vitals Group     BP 09/08/23 1744 136/84     Systolic BP Percentile --      Diastolic BP Percentile --      Pulse Rate 09/08/23 1744 89     Resp 09/08/23 1744 16     Temp 09/08/23 1744 (!) 100.7 F (38.2 C)     Temp Source 09/08/23 1744 Oral     SpO2 09/08/23 1744 99 %     Weight --      Height --      Head Circumference --      Peak Flow --      Pain Score 09/08/23 1745 0     Pain Loc --      Pain Education --      Exclude from Growth Chart --    No data  found.  Updated Vital Signs BP 136/84 (BP Location: Right Arm)   Pulse 89   Temp (!) 100.7 F (38.2 C) (Oral)   Resp 16   LMP 11/17/2015   SpO2 99%   Visual Acuity Right Eye Distance:  Left Eye Distance:   Bilateral Distance:    Right Eye Near:   Left Eye Near:    Bilateral Near:     Physical Exam Vitals and nursing note reviewed.  Constitutional:      General: She is not in acute distress.    Appearance: Normal appearance.  HENT:     Head: Normocephalic.     Right Ear: Tympanic membrane, ear canal and external ear normal.     Left Ear: Tympanic membrane, ear canal and external ear normal.     Nose: Congestion present.     Right Turbinates: Enlarged and swollen.     Left Turbinates: Enlarged and swollen.     Right Sinus: No maxillary sinus tenderness or frontal sinus tenderness.     Left Sinus: No maxillary sinus tenderness or frontal sinus tenderness.     Mouth/Throat:     Lips: Pink.     Mouth: Mucous membranes are moist.     Pharynx: Uvula midline. Posterior oropharyngeal erythema and postnasal drip present. No oropharyngeal exudate or uvula swelling.     Comments: Cobblestoning present to posterior oropharynx  Eyes:     Extraocular Movements: Extraocular movements intact.     Conjunctiva/sclera: Conjunctivae normal.     Pupils: Pupils are equal, round, and reactive to light.  Cardiovascular:     Rate and Rhythm: Normal rate and regular rhythm.     Pulses: Normal pulses.     Heart sounds: Normal heart sounds.  Pulmonary:     Effort: Pulmonary effort is normal. No respiratory distress.     Breath sounds: Normal breath sounds. No stridor. No wheezing, rhonchi or rales.  Abdominal:     General: Bowel sounds are normal.     Palpations: Abdomen is soft.     Tenderness: There is no abdominal tenderness.  Musculoskeletal:     Cervical back: Normal range of motion.  Lymphadenopathy:     Cervical: No cervical adenopathy.  Skin:    General: Skin is warm and dry.   Neurological:     General: No focal deficit present.     Mental Status: She is alert and oriented to person, place, and time.  Psychiatric:        Mood and Affect: Mood normal.        Behavior: Behavior normal.      UC Treatments / Results  Labs (all labs ordered are listed, but only abnormal results are displayed) Labs Reviewed  POC COVID19/FLU A&B COMBO - Abnormal; Notable for the following components:      Result Value   Influenza A Antigen, POC Positive (*)    All other components within normal limits    EKG   Radiology No results found.  Procedures Procedures (including critical care time)  Medications Ordered in UC Medications  acetaminophen (TYLENOL) tablet 650 mg (650 mg Oral Given 09/08/23 1749)    Initial Impression / Assessment and Plan / UC Course  I have reviewed the triage vital signs and the nursing notes.  Pertinent labs & imaging results that were available during my care of the patient were reviewed by me and considered in my medical decision making (see chart for details).  COVID/flu test was positive for influenza A.  Will start patient on Tamiflu 75 mg twice daily.  Symptomatic treatment provided with fluticasone 50 mcg nasal spray for nasal congestion, and Promethazine DM for cough.  Supportive care recommendations were provided and discussed with the patient to include fluids, rest, over-the-counter analgesics,  and use of normal saline nasal spray to help with nasal congestion and runny nose.  Patient was advised to remain home until she has been fever free for at least 24 hours with no medication.  Discussed indications with the patient open follow-up be indicated.  Patient was in agreement with this plan of care and verbalized understanding.  All questions were answered.  Patient stable for discharge.  Final Clinical Impressions(s) / UC Diagnoses   Final diagnoses:  Influenza A     Discharge Instructions      You did test positive for  influenza A. Take medication as prescribed. Increase fluids and allow for plenty of rest. May over-the-counter Tylenol as needed for pain, fever, general discomfort. Normal saline nasal spray for nasal congestion and runny nose.  You may use the nasal spray you have at home to help with the nasal congestion and runny nose. For the cough, recommend using a humidifier in the bedroom at nighttime during sleep and sleeping elevated on pillows while cough symptoms persist. As discussed, you should remain home until you have been fever free for 24 hours with no medication. Follow-up as needed.     ED Prescriptions     Medication Sig Dispense Auth. Provider   oseltamivir (TAMIFLU) 75 MG capsule Take 1 capsule (75 mg total) by mouth every 12 (twelve) hours. 10 capsule Leath-Warren, Sadie Haber, NP   fluticasone (FLONASE) 50 MCG/ACT nasal spray Place 2 sprays into both nostrils daily. 16 g Leath-Warren, Sadie Haber, NP   promethazine-dextromethorphan (PROMETHAZINE-DM) 6.25-15 MG/5ML syrup Take 5 mLs by mouth 4 (four) times daily as needed. 118 mL Leath-Warren, Sadie Haber, NP      PDMP not reviewed this encounter.   Abran Cantor, NP 09/08/23 1907

## 2023-09-19 ENCOUNTER — Other Ambulatory Visit: Payer: Self-pay | Admitting: Family Medicine

## 2023-10-17 ENCOUNTER — Encounter: Payer: Self-pay | Admitting: Family Medicine

## 2023-10-17 ENCOUNTER — Ambulatory Visit: Payer: 59 | Admitting: Family Medicine

## 2023-10-17 VITALS — BP 124/83 | HR 72 | Ht <= 58 in | Wt 183.0 lb

## 2023-10-17 DIAGNOSIS — F5104 Psychophysiologic insomnia: Secondary | ICD-10-CM

## 2023-10-17 DIAGNOSIS — K219 Gastro-esophageal reflux disease without esophagitis: Secondary | ICD-10-CM

## 2023-10-17 DIAGNOSIS — E559 Vitamin D deficiency, unspecified: Secondary | ICD-10-CM | POA: Diagnosis not present

## 2023-10-17 DIAGNOSIS — L819 Disorder of pigmentation, unspecified: Secondary | ICD-10-CM | POA: Insufficient documentation

## 2023-10-17 DIAGNOSIS — F411 Generalized anxiety disorder: Secondary | ICD-10-CM

## 2023-10-17 DIAGNOSIS — B369 Superficial mycosis, unspecified: Secondary | ICD-10-CM

## 2023-10-17 DIAGNOSIS — E042 Nontoxic multinodular goiter: Secondary | ICD-10-CM

## 2023-10-17 MED ORDER — HYDROXYZINE HCL 10 MG PO TABS: ORAL_TABLET | ORAL | 5 refills | Status: DC

## 2023-10-17 NOTE — Assessment & Plan Note (Signed)
Not controlled  Add hydroxyzine at bedtime and practice good sleep hygiene, ifo will be sent on this Continue xanax

## 2023-10-17 NOTE — Assessment & Plan Note (Signed)
Controlled, no change in medication Med refilled x 1 year

## 2023-10-17 NOTE — Assessment & Plan Note (Signed)
Maculopapular hyperpigmented lesion left maxilla,present since childhood, irregular border, increasing in size , refer dermatology/ plastic surgery

## 2023-10-17 NOTE — Assessment & Plan Note (Signed)
  Patient re-educated about  the importance of commitment to a  minimum of 150 minutes of exercise per week as able.  The importance of healthy food choices with portion control discussed, as well as eating regularly and within a 12 hour window most days. The need to choose "clean , green" food 50 to 75% of the time is discussed, as well as to make water the primary drink and set a goal of 64 ounces water daily.       10/17/2023    4:27 PM 08/25/2023    4:17 PM 07/24/2023   10:33 AM  Weight /BMI  Weight 183 lb 185 lb 186 lb  Height 4\' 10"  (1.473 m) 4\' 10"  (1.473 m) 4' 9.25" (1.454 m)  BMI 38.25 kg/m2 38.67 kg/m2 39.9 kg/m2    Lifestyle change

## 2023-10-17 NOTE — Assessment & Plan Note (Signed)
Updated lab needed at/ before next visit.

## 2023-10-17 NOTE — Progress Notes (Signed)
   [name] [name]     MRN: [id number]      DOB: [birth year]  Chief Complaint  Patient presents with   Follow-up    Follow up discuss contrave    HPI [name] is here for follow up and re-evaluation of chronic medical conditions, medication management and review of any available recent lab and radiology data.  Preventive health is updated, specifically  Cancer screening and Immunization. Needs mammogram and will schedule  Questions or concerns regarding consultations or procedures which the PT has had in the interim are  addressed. Weight loss med not covered, will focus on lifestyle , still not exercising C/o increased anxiety and chronic poor sleep   ROS Denies recent fever or chills. Denies sinus pressure, nasal congestion, ear pain or sore throat. Denies chest congestion, productive cough or wheezing. Denies chest pains, palpitations and leg swelling Denies abdominal pain, nausea, vomiting,diarrhea or constipation.   Denies dysuria, frequency, hesitancy or incontinence. Denies joint pain, swelling and limitation in mobility. Denies headaches, seizures, numbness, or tingling. Denies skin break down or rash.   PE  BP 124/83 (BP Location: Left Arm, Patient Position: Sitting, Cuff Size: Large)   Pulse 72   Ht 4\' 10"  (1.473 m)   Wt 183 lb (83 kg)   LMP 11/17/2015   SpO2 98%   BMI 38.25 kg/m   Patient alert and oriented and in no cardiopulmonary distress.  HEENT: No facial asymmetry, EOMI,     Neck supple .  Chest: Clear to auscultation bilaterally.  CVS: S1, S2 no murmurs, no S3.Regular rate.  Ext: No edema  MS: Adequate ROM spine, shoulders, hips and knees.  Skin: Intact, hyperpigmented lesion dia approx 2.5 cm left maxilla irreg border  Psych: Good eye contact, normal affect. Memory intact not anxious or depressed appearing.  CNS: CN 2-12 intact, power,  normal throughout.no focal deficits noted.   Assessment & Plan  Insomnia Not controlled  Add hydroxyzine at  bedtime and practice good sleep hygiene, ifo will be sent on this Continue xanax  GERD Controlled, no change in medication Med refilled x 1 year  Morbid obesity (HCC)  Patient re-educated about  the importance of commitment to a  minimum of 150 minutes of exercise per week as able.  The importance of healthy food choices with portion control discussed, as well as eating regularly and within a 12 hour window most days. The need to choose "clean , green" food 50 to 75% of the time is discussed, as well as to make water the primary drink and set a goal of 64 ounces water daily.       10/17/2023    4:27 PM 08/25/2023    4:17 PM 07/24/2023   10:33 AM  Weight /BMI  Weight 183 lb 185 lb 186 lb  Height 4\' 10"  (1.473 m) 4\' 10"  (1.473 m) 4' 9.25" (1.454 m)  BMI 38.25 kg/m2 38.67 kg/m2 39.9 kg/m2    Lifestyle change   Vitamin D deficiency Updated lab needed at/ before next visit.   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

## 2023-10-17 NOTE — Assessment & Plan Note (Signed)
Increased anxiety reported, no interest in therapy, no new personal stress reported, will start exercise and recommit to increased spirituality

## 2023-10-17 NOTE — Patient Instructions (Addendum)
F/U in June , call if you need me sooner  Please schedule your past due mammogram  New additional med for anxiety and sleep is hydroxyzine 10 mg take one at bedtime, continue alprazolam as before  Send message if needed for dose adjustment ONE time between visits  We will mail fasting labs to get first week in June/ end May  CBC, lipid, cmp and eGFr, TSH, and Vit D and HIV  Medication will be refilled to June appointment  It is important that you exercise regularly at least 30 minutes 5 times a week. If you develop chest pain, have severe difficulty breathing, or feel very tired, stop exercising immediately and seek medical attention   You are referred to plastic surgery re mole on left jawew that is increasing in size   Plant based eating  Stress management best way that works for you.  Thanks for choosing [facility], we consider it a privelige to serve you.

## 2023-10-17 NOTE — Assessment & Plan Note (Signed)
Controlled on topical meds as needed, continue both

## 2023-10-18 ENCOUNTER — Other Ambulatory Visit: Payer: Self-pay

## 2023-10-18 DIAGNOSIS — Z139 Encounter for screening, unspecified: Secondary | ICD-10-CM

## 2023-10-18 DIAGNOSIS — Z13220 Encounter for screening for lipoid disorders: Secondary | ICD-10-CM

## 2023-10-18 DIAGNOSIS — E559 Vitamin D deficiency, unspecified: Secondary | ICD-10-CM

## 2023-10-18 DIAGNOSIS — R7989 Other specified abnormal findings of blood chemistry: Secondary | ICD-10-CM

## 2023-10-26 ENCOUNTER — Ambulatory Visit (HOSPITAL_COMMUNITY)
Admission: RE | Admit: 2023-10-26 | Discharge: 2023-10-26 | Disposition: A | Discharge status: Home / Self Care | Payer: 59 | Source: Ambulatory Visit | Attending: Family Medicine | Admitting: Family Medicine

## 2023-10-26 DIAGNOSIS — Z1231 Encounter for screening mammogram for malignant neoplasm of breast: Secondary | ICD-10-CM | POA: Diagnosis present

## 2023-11-14 ENCOUNTER — Ambulatory Visit: Payer: 59 | Admitting: Physician Assistant

## 2023-11-14 ENCOUNTER — Encounter: Payer: Self-pay | Admitting: Physician Assistant

## 2023-11-14 VITALS — BP 122/76 | Temp 98.1°F | Ht <= 58 in

## 2023-11-14 DIAGNOSIS — J069 Acute upper respiratory infection, unspecified: Secondary | ICD-10-CM | POA: Diagnosis not present

## 2023-11-14 MED ORDER — CETIRIZINE HCL 10 MG PO TABS
10.0000 mg | ORAL_TABLET | Freq: Every day | ORAL | 2 refills | Status: AC
Start: 2023-11-14 — End: ?

## 2023-11-14 NOTE — Progress Notes (Signed)
   Acute Office Visit  Subjective:     Patient ID: ELIZABELLE FITE, female    DOB: 1970/12/21, 53 y.o.   MRN: [id number]   Patient presents today with rhinorrhea and watery eyes. She states symptoms began on Sunday, denies sick contacts. Associated symptoms include congestion and post nasal drip. She denies fever or headaches. She has taken OTC Alka Seltzer Plus for symptoms. She is eating and drinking per usual.      Review of Systems  Constitutional:  Negative for chills, fever and malaise/fatigue.  HENT:  Positive for congestion and sinus pain. Negative for ear discharge, ear pain and sore throat.   Eyes:  Positive for discharge. Negative for pain.  Respiratory:  Negative for cough and shortness of breath.   Cardiovascular:  Negative for chest pain and palpitations.  Musculoskeletal:  Negative for myalgias.  Neurological:  Negative for headaches.        Objective:     BP 122/76   Temp 98.1 F (36.7 C) (Oral)   Ht 4\' 10"  (1.473 m)   LMP 11/17/2015   BMI 38.25 kg/m   Physical Exam Vitals reviewed.  Constitutional:      General: She is not in acute distress.    Appearance: Normal appearance.  HENT:     Right Ear: Tympanic membrane normal.     Left Ear: Tympanic membrane normal.     Nose: Nose normal.     Mouth/Throat:     Mouth: Mucous membranes are moist.     Pharynx: Oropharynx is clear.  Eyes:     Extraocular Movements: Extraocular movements intact.     Conjunctiva/sclera: Conjunctivae normal.  Cardiovascular:     Rate and Rhythm: Normal rate and regular rhythm.     Heart sounds: No murmur heard. Pulmonary:     Effort: Pulmonary effort is normal.     Breath sounds: Normal breath sounds. No wheezing.  Musculoskeletal:        General: Normal range of motion.  Lymphadenopathy:     Cervical: No cervical adenopathy.  Skin:    General: Skin is warm and dry.     Capillary Refill: Capillary refill takes less than 2 seconds.  Neurological:     General: No focal  deficit present.     Mental Status: She is alert and oriented to person, place, and time.  Psychiatric:        Mood and Affect: Mood normal.        Behavior: Behavior normal.     No results found for any visits on 11/14/23.      Assessment & Plan:  Viral URI -     Cetirizine HCl; Take 1 tablet (10 mg total) by mouth daily.  Dispense: 30 tablet; Refill: 2  Patient appears stable today. Benign exam. Likely self-resolving viral infection. Supportive care reviewed with patient. Discussed with patient that there are no indications for antibiotics at this time, and viral respiratory illness can be persistent in duration.Tylenol or ibuprofen for pain or fever as needed. Zyrtec for runny nose and watery eyes. I advised Flonase for nasal congestion and ear pressure. May continue with OTC cold medications. Patient instructed to return to clinic if worsening shortness of breath, chest pain, hypoxia, or other concerns. Patient agreeable to plan.    Return if symptoms worsen or fail to improve.  Toni Amend Sena Clouatre, PA-C

## 2024-02-20 ENCOUNTER — Ambulatory Visit
Admission: RE | Admit: 2024-02-20 | Discharge: 2024-02-20 | Disposition: A | Discharge status: Home / Self Care | Source: Ambulatory Visit | Attending: Nurse Practitioner | Admitting: Nurse Practitioner

## 2024-02-20 ENCOUNTER — Ambulatory Visit: Payer: Self-pay | Admitting: *Deleted

## 2024-02-20 VITALS — BP 121/84 | HR 115 | Temp 98.8°F | Resp 18

## 2024-02-20 DIAGNOSIS — J069 Acute upper respiratory infection, unspecified: Secondary | ICD-10-CM | POA: Diagnosis not present

## 2024-02-20 LAB — POC SARS CORONAVIRUS 2 AG -  ED: SARS Coronavirus 2 Ag: NEGATIVE

## 2024-02-20 LAB — POCT RAPID STREP A (OFFICE): Rapid Strep A Screen: NEGATIVE

## 2024-02-20 MED ORDER — PROMETHAZINE-DM 6.25-15 MG/5ML PO SYRP: 5.0000 mL | ORAL_SOLUTION | Freq: Four times a day (QID) | ORAL | 0 refills | Status: DC | PRN

## 2024-02-20 NOTE — ED Provider Notes (Signed)
 [facility]    CSN: [id number] Arrival date & time: 02/20/24  1551      History   Chief Complaint Chief Complaint  Patient presents with   Cough    congestion, sore throat - Entered by patient    HPI [name] [name] is a 53 y.o. female.   The history is provided by the patient.   Patient presents with a 5-day history of sore throat, nasal congestion, chest congestion, and cough.  Patient denies fever, chills, headache, ear pain, ear drainage, wheezing, difficulty breathing, chest pain, abdominal pain, nausea, vomiting, diarrhea, or rash.  Patient reports she has been taking Zyrtec  and Mucinex for symptoms with minimal relief.  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 08/15/2016   Allergic sinusitis 03/14/2016   Intermittent palpitations 12/12/2015   Paroxysmal atrial tachycardia (HCC) 12/12/2015   Insomnia 11/23/2015   Knee pain, left 12/16/2014   Morbid obesity (HCC) 11/20/2012   Vitamin D  deficiency 11/20/2012   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   ANXIETY DISORDER, GENERALIZED 07/13/2007   Allergic rhinitis 07/13/2007   GERD 07/13/2007    Past Surgical History:  Procedure Laterality Date   ABDOMINAL HYSTERECTOMY  01/16/2016 [facility])   partial for fibroids   CESAREAN SECTION     COLONOSCOPY WITH PROPOFOL  N/A 02/10/2021   Procedure: COLONOSCOPY WITH PROPOFOL ;  Surgeon: Ruby Corporal, MD;  Location: [facility];  Service: Endoscopy;  Laterality: N/A;  11:00   DILATION AND CURETTAGE OF UTERUS      HYSTEROSCOPY WITH D & C  04/02/2012   Procedure: DILATATION AND CURETTAGE /HYSTEROSCOPY;  Surgeon: Kandra Orn, MD;  Location: [facility];  Service: Gynecology;  Laterality: N/A;   LAPAROSCOPIC GASTRIC SLEEVE RESECTION WITH HIATAL HERNIA REPAIR N/A 08/15/2016   Procedure: LAPAROSCOPIC GASTRIC SLEEVE RESECTION WITH HIATAL HERNIA REPAIR WITH UPPER ENDO;  Surgeon: Jacolyn Matar, MD;  Location: [facility];  Service: General;  Laterality: N/A;   LAPAROSCOPIC TUBAL LIGATION  04/02/2012   Procedure: LAPAROSCOPIC TUBAL LIGATION;  Surgeon: Kandra Orn, MD;  Location: [facility];  Service: Gynecology;  Laterality: Bilateral;   TRANSTHORACIC ECHOCARDIOGRAM  01/11/2009   EF >55%, normal    OB History     Gravida  2   Para  1   Term  1   Preterm      AB  1   Living  1      SAB  1   IAB      Ectopic      Multiple      Live Births               Home Medications    Prior to Admission medications   Medication Sig Start Date End Date Taking? Authorizing Provider  promethazine -dextromethorphan (PROMETHAZINE -DM) 6.25-15 MG/5ML syrup Take 5 mLs by mouth 4 (four) times daily as needed. 02/20/24  Yes Leath-Warren, Belen Bowers, NP  acetaminophen  (TYLENOL ) 500 MG tablet Take 1,000 mg by mouth every 6 (six) hours  as needed for moderate pain or headache.    [provider]  ALPRAZolam  (XANAX ) 1 MG tablet Take 1 tablet (1 mg total) by mouth at bedtime as needed for anxiety. Take 1 mg by mouth at bedtime as needed for anxiety. 08/25/23   Towanda Fret, MD  cetirizine  (ZYRTEC  ALLERGY) 10 MG tablet Take 1 tablet (10 mg total) by mouth daily. 11/14/23   Grooms, [place], PA-C  clotrimazole -betamethasone  (LOTRISONE ) cream Apply twice daily to affected area for 1 week, then as needed 08/04/22   Towanda Fret, MD  fluticasone  (FLONASE ) 50 MCG/ACT nasal spray Place 2 sprays into both nostrils daily. 09/08/23   Leath-Warren, Belen Bowers, NP  hydrOXYzine  (ATARAX ) 10 MG tablet Take  one tablet at bedtime for anxiety and sleep 10/17/23   Towanda Fret, MD  Multiple Vitamin (MULTIVITAMIN PO) Take by mouth.    [provider]  NYSTATIN  powder Apply topically 2 (two) times daily. 08/25/23   Towanda Fret, MD  omeprazole  (PRILOSEC ) 40 MG capsule TAKE 1 CAPSULE(40 MG) BY MOUTH DAILY 09/19/23   Towanda Fret, MD  Propylene Glycol (SYSTANE BALANCE) 0.6 % SOLN Place 1 drop into both eyes daily as needed (dry eyes).    [provider]  RESTASIS  0.05 % ophthalmic emulsion Place 1 drop into both eyes 2 (two) times daily. 07/16/13   [provider]    Family History Family History  Problem Relation Age of Onset   Hypertension Mother    Cancer Father 78        prostate   Diabetes Father    Hypertension Father    Hypertension Sister    Diabetes Sister    Hypertension Sister    Diabetes Sister    Cancer Paternal Aunt 14       breast cancer   Breast cancer Paternal Aunt 6    Social History Social History   Tobacco Use   Smoking status: Never   Smokeless tobacco: Never  Substance Use Topics   Alcohol use: Not Currently   Drug use: No     Allergies   Diltiazem and Phentermine    Review of Systems Review of Systems Per HPI  Physical Exam Triage Vital Signs ED Triage Vitals  Encounter Vitals Group     BP 02/20/24 1557 121/84     Systolic BP Percentile --      Diastolic BP Percentile --      Pulse Rate 02/20/24 1557 (!) 115     Resp 02/20/24 1557 18     Temp 02/20/24 1557 98.8 F (37.1 C)     Temp Source 02/20/24 1557 Oral     SpO2 02/20/24 1557 95 %     Weight --      Height --      Head Circumference --      Peak Flow --      Pain Score 02/20/24 1559 3     Pain Loc --      Pain Education --      Exclude from Growth Chart --    No data found.  Updated Vital Signs BP 121/84 (BP Location: Right Arm)   Pulse (!) 115   Temp 98.8 F (37.1 C) (Oral)   Resp 18   LMP 11/17/2015   SpO2 95%   Visual  Acuity Right Eye Distance:   Left Eye Distance:   Bilateral Distance:    Right Eye Near:   Left Eye Near:    Bilateral Near:  Physical Exam Vitals and nursing note reviewed.  Constitutional:      General: She is not in acute distress.    Appearance: Normal appearance.  HENT:     Head: Normocephalic.     Right Ear: Tympanic membrane, ear canal and external ear normal.     Left Ear: Tympanic membrane, ear canal and external ear normal.     Nose: Congestion present.     Mouth/Throat:     Mouth: Mucous membranes are moist.  Eyes:     Extraocular Movements: Extraocular movements intact.     Pupils: Pupils are equal, round, and reactive to light.  Cardiovascular:     Rate and Rhythm: Normal rate and regular rhythm.     Pulses: Normal pulses.     Heart sounds: Normal heart sounds.  Pulmonary:     Effort: Pulmonary effort is normal. No respiratory distress.     Breath sounds: Normal breath sounds. No stridor. No wheezing, rhonchi or rales.  Abdominal:     General: Bowel sounds are normal.     Palpations: Abdomen is soft.     Tenderness: There is no abdominal tenderness.  Musculoskeletal:     Cervical back: Normal range of motion.  Skin:    General: Skin is warm and dry.  Neurological:     General: No focal deficit present.     Mental Status: She is alert and oriented to person, place, and time.  Psychiatric:        Mood and Affect: Mood normal.        Behavior: Behavior normal.      UC Treatments / Results  Labs (all labs ordered are listed, but only abnormal results are displayed) Labs Reviewed  POCT RAPID STREP A (OFFICE) - Normal  POC SARS CORONAVIRUS 2 AG -  ED    EKG   Radiology No results found.  Procedures Procedures (including critical care time)  Medications Ordered in UC Medications - No data to display  Initial Impression / Assessment and Plan / UC Course  I have reviewed the triage vital signs and the nursing notes.  Pertinent labs &  imaging results that were available during my care of the patient were reviewed by me and considered in my medical decision making (see chart for details).  The COVID test and rapid strep test were negative.  On exam, lung sounds are clear throughout, room air sats at 95%.  The patient is well-appearing, she is in no acute distress, vital signs are stable.  Symptoms are consistent with a viral URI with cough.  Will treat symptomatically with Promethazine  DM for the cough.  Patient will continue cetirizine  and Flonase  daily.  Supportive care recommendations were provided and discussed with the patient to include fluids, rest, normal saline nasal spray, over-the-counter analgesics, and use of a humidifier during sleep.  Discussed indications with patient regarding follow-up.  Patient was in agreement with this plan of care and verbalizes understanding.  All questions were answered.  Patient stable for discharge.  Final Clinical Impressions(s) / UC Diagnoses   Final diagnoses:  Viral URI with cough     Discharge Instructions      The COVID test and rapid strep test were negative. Take medication as prescribed. Continue use of Flonase  and Zyrtec . Increase fluids allow for plenty of rest. May take over-the-counter Tylenol  or ibuprofen as needed for pain, fever, or general discomfort. Warm salt water gargles 3-4 times daily as needed for throat pain or discomfort. Recommend the  use of normal saline nasal spray throughout the day for nasal congestion and runny nose. For your cough, recommend use of a humidifier in your bedroom at nighttime during sleep and sleeping elevated on pillows while cough symptoms persist. As discussed, if symptoms do not improve over the next 5 to 7 days, or appear to be worsening, it is recommended that you follow-up in this clinic or with your primary care physician for further evaluation. Follow-up as needed.   ED Prescriptions     Medication Sig Dispense Auth.  Provider   promethazine -dextromethorphan (PROMETHAZINE -DM) 6.25-15 MG/5ML syrup Take 5 mLs by mouth 4 (four) times daily as needed. 118 mL Leath-Warren, Belen Bowers, NP      PDMP not reviewed this encounter.   Hardy Lia, NP 02/20/24 [phone]

## 2024-02-20 NOTE — ED Triage Notes (Signed)
 Congestion, productive cough since Thursday.  Has been taking mucinex and zyrtec  without relief.  States throat is sore

## 2024-02-20 NOTE — Discharge Instructions (Addendum)
 The COVID test and rapid strep test were negative. Take medication as prescribed. Continue use of Flonase  and Zyrtec . Increase fluids allow for plenty of rest. May take over-the-counter Tylenol  or ibuprofen as needed for pain, fever, or general discomfort. Warm salt water gargles 3-4 times daily as needed for throat pain or discomfort. Recommend the use of normal saline nasal spray throughout the day for nasal congestion and runny nose. For your cough, recommend use of a humidifier in your bedroom at nighttime during sleep and sleeping elevated on pillows while cough symptoms persist. As discussed, if symptoms do not improve over the next 5 to 7 days, or appear to be worsening, it is recommended that you follow-up in this clinic or with your primary care physician for further evaluation. Follow-up as needed.

## 2024-02-20 NOTE — Telephone Encounter (Signed)
  Chief Complaint: congestion, cough Symptoms: nasal congestion, cough, sore throat Frequency: stared Thursday Pertinent Negatives: Patient denies fever Disposition: [] ED /[x] Urgent Care (no appt availability in office) / [] Appointment(In office/virtual)/ []  [facility]/ [] Home Care/ [] Refused Recommended Disposition /[] [facility]/ []  Follow-up with PCP Additional Notes: No open appointment- UC appointment scheduled.     Copied from CRM [id number]. Topic: Clinical - Red Word Triage >> Feb 20, 2024  9:53 AM Elle L wrote: Red Word that prompted transfer to Nurse Triage: The patient states she is having upper respiratory issues and a productive cough. Reason for Disposition  Lots of coughing  Answer Assessment - Initial Assessment Questions 1. LOCATION: "Where does it hurt?"      Nose to throat 2. ONSET: "When did the sinus pain start?"  (e.g., hours, days)      Thursday night 3. SEVERITY: "How bad is the pain?"   (Scale 1-10; mild, moderate or severe)   - MILD (1-3): doesn't interfere with normal activities    - MODERATE (4-7): interferes with normal activities (e.g., work or school) or awakens from sleep   - SEVERE (8-10): excruciating pain and patient unable to do any normal activities        Sore throat, hurts to talk 4. RECURRENT SYMPTOM: "Have you ever had sinus problems before?" If Yes, ask: "When was the last time?" and "What happened that time?"      Yes- not often- February  5. NASAL CONGESTION: "Is the nose blocked?" If Yes, ask: "Can you open it or must you breathe through your mouth?"     Completely blocked 6. NASAL DISCHARGE: "Do you have discharge from your nose?" If so ask, "What color?"     Yes- brownish/yellow 7. FEVER: "Do you have a fever?" If Yes, ask: "What is it, how was it measured, and when did it start?"      no 8. OTHER SYMPTOMS: "Do you have any other symptoms?" (e.g., sore throat, cough, earache, difficulty breathing)     Sore throat,  cough  Protocols used: Sinus Pain or Congestion-A-AH

## 2024-02-27 ENCOUNTER — Ambulatory Visit: Payer: Self-pay

## 2024-02-27 NOTE — Telephone Encounter (Signed)
 FYI Only or Action Required?: FYI only for provider  Patient was last seen in primary care on 10/17/2023 by Towanda Fret, MD. Called Nurse Triage reporting Cough. Symptoms began June 1st. Interventions attempted: OTC medications: Sudafed, Tylenol , Excedrin Migraine, Prescription medications: Promethazine -DM, and Rest, hydration, or home remedies. Symptoms are: headache, nasal congestion, runny nose, productive cough with yellow to brown/green mucusgradually worsening.  Triage Disposition: See PCP When Office is Open (Within 3 Days) (overriding See Physician Within 24 Hours)  Patient/caregiver understands and will follow disposition?: Yes                        Copied from CRM [id number]. Topic: Clinical - Red Word Triage >> Feb 27, 2024 11:28 AM El Gravely T wrote: Red Word that prompted transfer to Nurse Triage: Per patient states went to urgent care last week, and tested for covid, along with other tests. All test returned negative, prescribed medication over the counter, and cough medication.   Above is not helping, she has gotten progressively worse. Still having congestion, with wheezing and chest congestion,shortness of breath gasping for air. Reason for Disposition  SEVERE coughing spells (e.g., whooping sound after coughing, vomiting after coughing)  Answer Assessment - Initial Assessment Questions 1. ONSET: "When did the cough begin?"     June 1st.  2. SEVERITY: "How bad is the cough today?"      She states the cough is not bad until she starts talking, but there is some wheezing and rattling. She states the cough is not spontaneous, it is brought on by laughing or talking.  3. SPUTUM: "Describe the color of your sputum" (none, dry cough; clear, white, yellow, green)     Yellow to brown color. Somewhat green.  4. HEMOPTYSIS: "Are you coughing up any blood?" If so ask: "How much?" (flecks, streaks, tablespoons, etc.)     No.  5. DIFFICULTY BREATHING: "Are you  having difficulty breathing?" If Yes, ask: "How bad is it?" (e.g., mild, moderate, severe)    - MILD: No SOB at rest, mild SOB with walking, speaks normally in sentences, can lie down, no retractions, pulse < 100.    - MODERATE: SOB at rest, SOB with minimal exertion and prefers to sit, cannot lie down flat, speaks in phrases, mild retractions, audible wheezing, pulse 100-120.    - SEVERE: Very SOB at rest, speaks in single words, struggling to breathe, sitting hunched forward, retractions, pulse > 120      "SOB after intensely coughing."  6. FEVER: "Do you have a fever?" If Yes, ask: "What is your temperature, how was it measured, and when did it start?"     No.  7. CARDIAC HISTORY: "Do you have any history of heart disease?" (e.g., heart attack, congestive heart failure)      Paroxysmal atrial tachycardia.  8. LUNG HISTORY: "Do you have any history of lung disease?"  (e.g., pulmonary embolus, asthma, emphysema)     No.  9. PE RISK FACTORS: "Do you have a history of blood clots?" (or: recent major surgery, recent prolonged travel, bedridden)     No.  10. OTHER SYMPTOMS: "Do you have any other symptoms?" (e.g., runny nose, wheezing, chest pain)       Intermittent wheezing with coughing, runny nose, nasal congestion, headache.  11. PREGNANCY: "Is there any chance you are pregnant?" "When was your last menstrual period?"       N/A.  12. TRAVEL: "Have you traveled out of the country  in the last month?" (e.g., travel history, exposures)       No.  Protocols used: Cough - Acute Productive-A-AH

## 2024-02-28 ENCOUNTER — Other Ambulatory Visit: Payer: Self-pay | Admitting: Family Medicine

## 2024-02-29 ENCOUNTER — Ambulatory Visit: Payer: Self-pay

## 2024-03-06 ENCOUNTER — Ambulatory Visit: Payer: Self-pay

## 2024-03-06 ENCOUNTER — Telehealth: Payer: Self-pay

## 2024-03-06 NOTE — Telephone Encounter (Signed)
 Copied from CRM [id number]. Topic: Clinical - Medication Question >> Mar 06, 2024  4:11 PM Fonda T wrote: Reason for CRM: Patient calling to check status of medication refill request of ALPRAZolam  (XANAX ) 1 MG tablet.  Per chart refill request is pending from 02/28/2024.  Patient contact patient with an update of refill request, as patient states she is out of medication, and takes it for anxiety.

## 2024-03-06 NOTE — Telephone Encounter (Signed)
 FYI Only or Action Required?: Action required by provider: medication refill request.  Patient was last seen in primary care on 10/17/2023 by Towanda Fret, MD. Called Nurse Triage reporting Anxiety. Symptoms began increased on the last couple of days. Interventions attempted: Other: has been out of her anxiety medication since 02/28/2024. Symptoms are: unchanged.  Triage Disposition: See PCP When Office is Open (Within 3 Days)  Patient/caregiver understands and will follow disposition?: No, wishes to speak with PCP  Copied from CRM [id number]. Topic: Clinical - Red Word Triage >> Mar 06, 2024  4:17 PM Fonda T wrote: Red Word that prompted transfer to Nurse Triage: Anxiety, patient states has been out of mediation for anxiety, ALPRAZolam  (XANAX ) 1 MG tablet, since 02/28/24, pharmacy has requested refill, with no response. Reason for Disposition  MODERATE anxiety (e.g., persistent or frequent anxiety symptoms; interferes with sleep, school, or work)  Answer Assessment - Initial Assessment Questions 1. CONCERN: Did anything happen that prompted you to call today?      Been out of her anxiety medication and needing it filled  2. ANXIETY SYMPTOMS: Can you describe how you (your loved one; patient) have been feeling? (e.g., tense, restless, panicky, anxious, keyed up, overwhelmed, sense of impending doom).      Increased anxious 3. ONSET: How long have you been feeling this way? (e.g., hours, days, weeks)     Last couple of days 4. SEVERITY: How would you rate the level of anxiety? (e.g., 0 - 10; or mild, moderate, severe).     Mild-moderate 5. FUNCTIONAL IMPAIRMENT: How have these feelings affected your ability to do daily activities? Have you had more difficulty than usual doing your normal daily activities? (e.g., getting better, same, worse; self-care, school, work, interactions)     No issues getting daily activities completed 6. HISTORY: Have you felt this way before? Have  you ever been diagnosed with an anxiety problem in the past? (e.g., generalized anxiety disorder, panic attacks, PTSD). If Yes, ask: How was this problem treated? (e.g., medicines, counseling, etc.)     Yes-hx of anxiety 7. RISK OF HARM - SUICIDAL IDEATION: Do you ever have thoughts of hurting or killing yourself? If Yes, ask:  Do you have these feelings now? Do you have a plan on how you would do this?     No SI or HI 8. TREATMENT:  What has been done so far to treat this anxiety? (e.g., medicines, relaxation strategies). What has helped?     medications 9. TREATMENT - THERAPIST: Do you have a counselor or therapist? Name?     Patient has in the past 12. OTHER SYMPTOMS: Do you have any other symptoms? (e.g., feeling depressed, trouble concentrating, trouble sleeping, trouble breathing, palpitations or fast heartbeat, chest pain, sweating, nausea, or diarrhea)       No  Patient primarily is wanting to get her medication filled.  Protocols used: Anxiety and Panic Attack-A-AH

## 2024-03-07 MED ORDER — ALPRAZOLAM 1 MG PO TABS: 1.0000 mg | ORAL_TABLET | Freq: Every day | ORAL | 2 refills | Status: DC

## 2024-03-07 NOTE — Addendum Note (Signed)
 Addended by: Towanda Fret on: 03/07/2024 09:50 PM   Modules accepted: Orders

## 2024-03-07 NOTE — Telephone Encounter (Signed)
 See other note. Waiting on prescribed response

## 2024-03-08 ENCOUNTER — Encounter: Payer: Self-pay | Admitting: Family Medicine

## 2024-03-08 MED ORDER — ALPRAZOLAM 1 MG PO TABS: 1.0000 mg | ORAL_TABLET | Freq: Every day | ORAL | 2 refills | Status: DC

## 2024-04-02 ENCOUNTER — Ambulatory Visit: Admitting: Family Medicine

## 2024-04-10 ENCOUNTER — Encounter: Payer: Self-pay | Admitting: Family Medicine

## 2024-04-10 ENCOUNTER — Ambulatory Visit: Admitting: Family Medicine

## 2024-04-10 VITALS — BP 119/82 | HR 77 | Resp 18 | Ht 59.0 in | Wt 194.1 lb

## 2024-04-10 DIAGNOSIS — F5104 Psychophysiologic insomnia: Secondary | ICD-10-CM

## 2024-04-10 DIAGNOSIS — R7989 Other specified abnormal findings of blood chemistry: Secondary | ICD-10-CM | POA: Diagnosis not present

## 2024-04-10 DIAGNOSIS — Z13220 Encounter for screening for lipoid disorders: Secondary | ICD-10-CM

## 2024-04-10 DIAGNOSIS — M25571 Pain in right ankle and joints of right foot: Secondary | ICD-10-CM

## 2024-04-10 DIAGNOSIS — K219 Gastro-esophageal reflux disease without esophagitis: Secondary | ICD-10-CM

## 2024-04-10 DIAGNOSIS — E559 Vitamin D deficiency, unspecified: Secondary | ICD-10-CM

## 2024-04-10 DIAGNOSIS — G8929 Other chronic pain: Secondary | ICD-10-CM

## 2024-04-10 MED ORDER — HYDROXYZINE HCL 10 MG PO TABS: ORAL_TABLET | ORAL | 1 refills | Status: DC

## 2024-04-10 MED ORDER — HYDROXYZINE HCL 10 MG PO TABS: ORAL_TABLET | ORAL | 3 refills | Status: AC

## 2024-04-10 NOTE — Patient Instructions (Addendum)
   F/U end November, please be in touch re  weight loss medicine and give pt coupon  Please  see new med dosing  Nurse pls print labs already ordered  take off HIV, [facility]    [facility] are being referred to [facility]  It is important that you exercise regularly at least 30 minutes 5 times a week. If you develop chest pain, have severe difficulty breathing, or feel very tired, stop exercising immediately and seek medical attention    Thanks for choosing [facility], we consider it a privelige to serve you.

## 2024-04-10 NOTE — Assessment & Plan Note (Addendum)
 1 month history, pain is 7 with weight bearing, and direct pressure refer Podiatry

## 2024-04-15 ENCOUNTER — Ambulatory Visit: Payer: Self-pay | Admitting: Podiatry

## 2024-04-18 ENCOUNTER — Ambulatory Visit: Payer: Self-pay | Admitting: Podiatry

## 2024-04-18 ENCOUNTER — Ambulatory Visit (INDEPENDENT_AMBULATORY_CARE_PROVIDER_SITE_OTHER)

## 2024-04-18 VITALS — Ht 59.0 in | Wt 194.0 lb

## 2024-04-18 DIAGNOSIS — M76821 Posterior tibial tendinitis, right leg: Secondary | ICD-10-CM

## 2024-04-18 DIAGNOSIS — M7751 Other enthesopathy of right foot: Secondary | ICD-10-CM

## 2024-04-18 DIAGNOSIS — M2142 Flat foot [pes planus] (acquired), left foot: Secondary | ICD-10-CM

## 2024-04-18 DIAGNOSIS — M2141 Flat foot [pes planus] (acquired), right foot: Secondary | ICD-10-CM

## 2024-04-18 NOTE — Patient Instructions (Addendum)
Posterior Tibial Tendinitis Posterior tibial tendinitis is irritation of a tendon called the posterior tibial tendon. Your posterior tibial tendon is a cord-like tissue that connects bones of your lower leg and foot to a muscle that:  Supports your arch.  Helps you raise up on your toes.  Helps you turn your foot down and in. This condition causes foot and ankle pain. It can also lead to a flat foot. What are the causes? This condition is most often caused by repeated stress to the tendon (overuse injury). It can also be caused by a sudden injury that stresses the tendon, such as landing on your foot after jumping or falling. What increases the risk? This condition is more likely to develop in:  People who play a sport that involves putting a lot of pressure on the feet, such as: ? Basketball. ? Tennis. ? Soccer. ? Hockey.  Runners.  Females who are older than 54 years of age and are overweight.  People with diabetes.  People with decreased foot stability.  People with flat feet. What are the signs or symptoms? Symptoms include:  Pain in the inner ankle.  Pain at the arch of your foot.  Pain that gets worse with running, walking, or standing.  Swelling on the inside of your ankle and foot.  Weakness in your ankle or foot.  Inability to stand up on tiptoe.  Flattening of the arch of your foot. How is this diagnosed? This condition may be diagnosed based on:  Your symptoms.  Your medical history.  A physical exam.  Tests, such as: ? X-ray. ? MRI. ? Ultrasound. How is this treated? This condition may be treated by:  Putting ice to the injured area.  Taking NSAIDs, such as ibuprofen, to reduce pain and swelling.  Wearing a special shoe or shoe insert to support your arch (orthotic).  Having physical therapy.  Replacing high-impact exercise with low-impact exercise, such as swimming or cycling. If your symptoms do not improve with these treatments, you  may need to wear a splint, removable walking boot, or short leg cast for 6-8 weeks to keep your foot and ankle still (immobilized). Follow these instructions at home: If you have a cast, splint, or boot:  Keep it clean and dry.  Check the skin around it every day. Tell your health care provider about any concerns. If you have a cast:  Do not stick anything inside it to scratch your skin. Doing that increases your risk of infection.  You may put lotion on dry skin around the edges of the cast. Do not put lotion on the skin underneath the cast. If you have a splint or boot:  Wear it as told by your health care provider. Remove it only as told by your health care provider.  Loosen it if your toes tingle, become numb, or turn cold and blue. Bathing  Do not take baths, swim, or use a hot tub until your health care provider approves. Ask your health care provider if you may take showers.  If your cast, splint, or boot is not waterproof: ? Do not let it get wet. ? Cover it with a waterproof covering while you take a bath or a shower. Managing pain and swelling   If directed, put ice on the injured area. ? If you have a removable splint or boot, remove it as told by your health care provider. ? Put ice in a plastic bag. ? Place a towel between your skin and   the bag or between your cast and the bag. ? Leave the ice on for 20 minutes, 2-3 times a day.  Move your toes often to reduce stiffness and swelling.  Raise (elevate) the injured area above the level of your heart while you are sitting or lying down. Activity  Do not use the injured foot to support your body weight until your health care provider says that you can. Use crutches as told by your health care provider.  Do not do activities that make pain or swelling worse.  Ask your health care provider when it is safe to drive if you have a cast, splint, or boot on your foot.  Return to your normal activities as told by your  health care provider. Ask your health care provider what activities are safe for you.  Do exercises as told by your health care provider. General instructions  Take over-the-counter and prescription medicines only as told by your health care provider.  If you have an orthotic, use it as told by your health care provider.  Keep all follow-up visits as told by your health care provider. This is important. How is this prevented?  Wear footwear that is appropriate to your athletic activity.  Avoid athletic activities that cause pain or swelling in your ankle or foot.  Before being active, do range-of-motion and stretching exercises.  If you develop pain or swelling while training, stop training.  If you have pain or swelling that does not improve after a few days of rest, see your health care provider.  If you start a new athletic activity, start gradually so you can build up your strength and flexibility. Contact a health care provider if:  Your symptoms get worse.  Your symptoms do not improve in 6-8 weeks.  You develop new, unexplained symptoms.  Your splint, boot, or cast gets damaged. Summary  Posterior tibial tendinitis is irritation of a tendon called the posterior tibial tendon.  This condition is most often caused by repeated stress to the tendon (overuse injury).  This condition causes foot pain and ankle pain. It can also lead to a flat foot.  This condition may be treated by not doing high-impact activities, applying ice, having physical therapy, wearing orthotics, and wearing a cast, splint, or boot if needed. This information is not intended to replace advice given to you by your health care provider. Make sure you discuss any questions you have with your health care provider. Document Revised: 01/01/2019 Document Reviewed: 11/08/2018 Elsevier Patient Education  2020 [employer]  

## 2024-04-18 NOTE — Progress Notes (Signed)
 Subjective:  Patient ID: [name] [name], female    DOB: [birth year],  MRN: [id number]  Chief Complaint  Patient presents with   Foot Pain    Rm 10 Patient is here for right foot pain that located on the interior side of the foot (close to the arch).  Pain has been present for the past 4 months. Patient states right foot is tender to the touch.    Discussed the use of AI scribe software for clinical note transcription with the patient, who gave verbal consent to proceed.  History of Present Illness [name] [name] is a 53 year old female who presents with right foot pain.  She has been experiencing right foot pain for over a month, initially attributing it to a twisted foot. The pain is localized to the navicular bone area and was particularly severe around the week of July 8th. Although she is not currently in pain, it has been intermittent and can be provoked by certain activities.  There is no history of a specific injury or event such as a fall or missing a step that triggered the pain. The pain does not radiate and is absent today, but she experiences difficulty and mild pain during activities like single and double heel rises, especially on the right side.  She has recently started working out and is concerned about how her foot condition might affect her ability to exercise. She notes that her right leg feels longer than the left, impacting her balance and weight distribution.  She wears size eight and a half to nine shoes and prefers brands like New Balance and [employer]ics for their support. She is mindful of her footwear choices to accommodate her foot condition.      Objective:    Physical Exam VASCULAR: DP and PT pulse palpable. Foot is warm and well-perfused. Capillary fill time is brisk. DERMATOLOGIC: Normal skin turgor, texture, and temperature. No open lesions, rashes, or ulcerations. NEUROLOGIC: Normal sensation to light touch and pressure. No paresthesias. ORTHOPEDIC: Significant  pes planus deformity with collapse of medial longitudinal arch and tenderness at navicular insertion. Mild pain with resisted inversion. Able to do single and double heel rises with some difficulty, no major pain. Radiographs show prominent navicular tuberosity with severe pes planus deformity. Smooth pain-free range of motion of all examined joints. No ecchymosis or bruising. No gross deformity. No pain to palpation.   No images are attached to the encounter.    Results Right foot radiograph: Prominent navicular tuberosity, severe pes planus deformity. (04/18/2024)   Assessment:   1. Posterior tibial tendon dysfunction (PTTD) of right lower extremity   2. Pes planus of both feet      Plan:  Patient was evaluated and treated and all questions answered.  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 No acute tendon tear currently, but risk of tendon tear and arthritis exists if not managed. Weight loss may alleviate symptoms but is not the cause of the condition. - Recommend wearing a lace-up ankle brace to support the tendon and prevent chronic inflammation. - Advise wearing supportive running shoes or sneakers with good arch support and insoles. Brands like New Balance, [place], and [place] are recommended. - Refer to an  orthotist for custom orthotics and insoles to prevent arch collapse. - Initiate physical therapy at a local outpatient rehab facility to develop a home exercise plan. Start at [facility] outpatient rehab near [facility] for convenience. - Advise against exercises that cause pain and to use the brace during workouts. - Schedule  follow-up appointment in six weeks to assess progress.      Return in about 6 weeks (around 05/30/2024) for Follow-up PT tendinitis.

## 2024-04-22 ENCOUNTER — Encounter: Payer: Self-pay | Admitting: Family Medicine

## 2024-04-22 DIAGNOSIS — Z0289 Encounter for other administrative examinations: Secondary | ICD-10-CM

## 2024-04-22 MED ORDER — ALPRAZOLAM 1 MG PO TABS: 1.0000 mg | ORAL_TABLET | Freq: Every day | ORAL | 5 refills | Status: AC

## 2024-04-22 NOTE — Assessment & Plan Note (Addendum)
  Patient re-educated about  the importance of commitment to a  minimum of 150 minutes of exercise per week as able.  The importance of healthy food choices with portion control discussed, as well as eating regularly and within a 12 hour window most days. The need to choose clean , green food 50 to 75% of the time is discussed, as well as to make water the primary drink and set a goal of 64 ounces water daily.       04/18/2024    4:35 PM 04/10/2024    4:13 PM 11/14/2023    1:28 PM  Weight /BMI  Weight 194 lb 194 lb 1 oz --  Height 4' 11 (1.499 m) 4' 11 (1.499 m) 4' 10 (1.473 m)  BMI 39.18 kg/m2 39.2 kg/m2 38.25 kg/m2   Contrave  not a good option due to personal h/o palpitations, anxiety and insomnia

## 2024-04-22 NOTE — Assessment & Plan Note (Signed)
Sleep hygiene reviewed and written information offered also. Prescription sent for  medication needed. Controlled, no change in medication  

## 2024-04-22 NOTE — Progress Notes (Signed)
   [name]     MRN: [id number]      DOB: 02/21/1971  Chief Complaint  Patient presents with   Medical Management of Chronic Issues    6 month follow up. Wants to discuss weightloss medication - contrave    Foot Pain    Complains of pain in the arch of her right foot. Hurts to touch. Been going on for  a while     HPI [name] [name] is here for follow up and re-evaluation of chronic medical conditions, medication management and review of any available recent lab and radiology data.  Preventive health is updated, specifically  Cancer screening and Immunization.   Questions or concerns regarding consultations or procedures which the PT has had in the interim are  addressed.Has upcoming Derm appointment The PT denies any adverse reactions to current medications since the last visit.  Concerns as above ROS Denies recent fever or chills. Denies sinus pressure, nasal congestion, ear pain or sore throat. Denies chest congestion, productive cough or wheezing. Denies chest pains, palpitations and leg swelling Denies abdominal pain, nausea, vomiting,diarrhea or constipation.   Denies dysuria, frequency, hesitancy or incontinence. Denies joint pain, swelling and limitation in mobility. Denies headaches, seizures, numbness, or tingling. Denies depression,  uncontrolled anxiety or insomnia.  PE  BP 119/82   Pulse 77   Resp 18   Ht 4' 11 (1.499 m)   Wt 194 lb 1 oz (88 kg)   LMP 11/17/2015   SpO2 98%   BMI 39.20 kg/m   Patient alert and oriented and in no cardiopulmonary distress.  HEENT: No facial asymmetry, EOMI,     Neck supple .  Chest: Clear to auscultation bilaterally.  CVS: S1, S2 no murmurs, no S3.Regular rate.  ABD: Soft non tender.   Ext: No edema  MS: Adequate ROM spine, shoulders, hips and knees.  Skin: Intact, no ulcerations or rash noted.  Psych: Good eye contact, normal affect. Memory intact not anxious or depressed appearing.  CNS: CN 2-12 intact, power,   normal throughout.no focal deficits noted.   Assessment & Plan  Ankle pain, right 1 month history, pain is 7 with weight bearing, and direct pressure refer Podiatry  Morbid obesity (HCC)  Patient re-educated about  the importance of commitment to a  minimum of 150 minutes of exercise per week as able.  The importance of healthy food choices with portion control discussed, as well as eating regularly and within a 12 hour window most days. The need to choose clean , green food 50 to 75% of the time is discussed, as well as to make water the primary drink and set a goal of 64 ounces water daily.       04/18/2024    4:35 PM 04/10/2024    4:13 PM 11/14/2023    1:28 PM  Weight /BMI  Weight 194 lb 194 lb 1 oz --  Height 4' 11 (1.499 m) 4' 11 (1.499 m) 4' 10 (1.473 m)  BMI 39.18 kg/m2 39.2 kg/m2 38.25 kg/m2   Contrave  not a good option due to personal h/o palpitations, anxiety and insomnia   Insomnia Sleep hygiene reviewed and written information offered also. Prescription sent for  medication needed. Controlled, no change in medication   GERD Controlled, no change in medication

## 2024-04-22 NOTE — Assessment & Plan Note (Signed)
 Controlled, no change in medication

## 2024-04-23 ENCOUNTER — Encounter: Payer: Self-pay | Admitting: Bariatrics

## 2024-04-23 ENCOUNTER — Ambulatory Visit: Admitting: Bariatrics

## 2024-04-23 VITALS — BP 158/92 | HR 66 | Temp 97.6°F | Ht <= 58 in | Wt 193.0 lb

## 2024-04-23 DIAGNOSIS — K219 Gastro-esophageal reflux disease without esophagitis: Secondary | ICD-10-CM | POA: Diagnosis not present

## 2024-04-23 DIAGNOSIS — E66813 Obesity, class 3: Secondary | ICD-10-CM

## 2024-04-23 DIAGNOSIS — Z6841 Body Mass Index (BMI) 40.0 and over, adult: Secondary | ICD-10-CM | POA: Diagnosis not present

## 2024-04-23 DIAGNOSIS — Z9884 Bariatric surgery status: Secondary | ICD-10-CM

## 2024-04-23 NOTE — Progress Notes (Signed)
 Office: [phone]  /  Fax: [phone]   Initial Visit  [name] [name] was seen in clinic today to evaluate for obesity. She is interested in losing weight to improve overall health and reduce the risk of weight related complications. She presents today to review program treatment options, initial physical assessment, and evaluation.     She was referred by: Friend or Family  When asked what else they would like to accomplish? She states: Adopt a healthier eating pattern and lifestyle, Improve energy levels and physical activity, Improve existing medical conditions, and Improve quality of life  When asked how has your weight affected you? She states: Contributed to medical problems, Having fatigue, and Having poor endurance  Some associated conditions: GERD  Contributing factors: family history of obesity, moderate to high levels of stress, menopause, and slow metabolism for age  Weight promoting medications identified: None  Current nutrition plan: High-protein and Portion control / smart choices  Current level of physical activity: Some intentional squats and other activities.   Current or previous pharmacotherapy: GLP-1  Response to medication: She was doing well with Wegovy  but only took it for approximately 1 month and her insurance stopped coverage.   Past medical history includes:   Past Medical History:  Diagnosis Date   Anxiety    Bursitis    Bursitis    Fibroid    GERD (gastroesophageal reflux disease)    Hypertension    seen by [facility]   Palpitations    PONV (postoperative nausea and vomiting)    Venous insufficiency      Objective:   BP (!) 158/92   Pulse 66   Temp 97.6 F (36.4 C)   Ht 4' 9 (1.448 m)   Wt 193 lb (87.5 kg)   LMP 11/17/2015   SpO2 100%   BMI 41.76 kg/m  She was weighed on the bioimpedance scale: Body mass index is 41.76 kg/m.  Peak Weight:230 lbs , Body Fat%:46.1 %, Visceral Fat Rating:15, Weight trend  over the last 12 months: Increasing  General:  Alert, oriented and cooperative. Patient is in no acute distress.  Respiratory: Normal respiratory effort, no problems with respiration noted  Extremities: Normal range of motion.    Mental Status: Normal mood and affect. Normal behavior. Normal judgment and thought content.   DIAGNOSTIC DATA REVIEWED:  BMET    Component Value Date/Time   NA 140 01/31/2023 1135   K 4.3 01/31/2023 1135   CL 102 01/31/2023 1135   CO2 22 01/31/2023 1135   GLUCOSE 71 01/31/2023 1135   GLUCOSE 80 07/15/2020 0924   BUN 11 01/31/2023 1135   CREATININE 0.84 01/31/2023 1135   CREATININE 0.79 07/15/2020 0924   CALCIUM 9.2 01/31/2023 1135   GFRNONAA 87 07/15/2020 0924   GFRAA 101 07/15/2020 0924   Lab Results  Component Value Date   HGBA1C 5.7 (H) 08/28/2014   HGBA1C 5.6 07/13/2010   No results found for: INSULIN CBC    Component Value Date/Time   WBC 6.3 01/31/2023 1135   WBC 7.6 07/15/2020 0924   RBC 4.47 01/31/2023 1135   RBC 4.33 07/15/2020 0924   HGB 13.4 01/31/2023 1135   HCT 39.9 01/31/2023 1135   PLT 343 01/31/2023 1135   MCV 89 01/31/2023 1135   MCH 30.0 01/31/2023 1135   MCH 29.3 07/15/2020 0924   MCHC 33.6 01/31/2023 1135   MCHC 32.9 07/15/2020 0924   RDW 12.4 01/31/2023 1135   Iron/TIBC/Ferritin/ %Sat    Component Value  Date/Time   IRON 51 11/23/2016 1657   FERRITIN 45 11/23/2016 1657   Lipid Panel     Component Value Date/Time   CHOL 182 01/31/2023 1135   TRIG 88 01/31/2023 1135   HDL 84 01/31/2023 1135   CHOLHDL 2.2 01/31/2023 1135   CHOLHDL 2.6 07/15/2020 0924   VLDL 13 08/28/2014 1450   LDLCALC 82 01/31/2023 1135   LDLCALC 85 07/15/2020 0924   Hepatic Function Panel     Component Value Date/Time   PROT 6.8 01/31/2023 1135   ALBUMIN 4.0 01/31/2023 1135   AST 13 01/31/2023 1135   ALT 9 01/31/2023 1135   ALKPHOS 81 01/31/2023 1135   BILITOT 0.4 01/31/2023 1135   BILIDIR 0.1 07/05/2011 1722   IBILI 0.3  07/05/2011 1722      Component Value Date/Time   TSH 0.593 01/31/2023 1135     Assessment and Plan:   Gastroesophageal reflux disease without esophagitis Symptoms are well-controlled. Medication: Prilosec   Plan: Continue PPI. Use antacids such as Tums or Mylanta as needed. May use OTC Pepcid along with PPI if needed. Avoid trigger foods (spicy foods, fatty/fried foods, acidic foods, coffee, alcohol). Avoid eating within 3 hours of bedtime. Continue to work on weight loss.   History of Bariatric Surgery:  Dr/Facility/State: Dr. Gladis, [facility].  Year: 2017 Complications: none Highest Weight: 230 lbs Lowest Weight: 180 lbs Taking vitamins: No vitamins Hx of deficiencies: none Hx of iron infusions: none  Plan Counseling You may need to eat 3 meals and 2 snacks, or 5 small meals each day in order to reach your protein and calorie goals.  Allow at least 15 minutes for each meal so that you can eat mindfully. Listen to your body so that you do not overeat. For most people, your sleeve or pouch will comfortably hold 4-6 ounces. Eat foods from all food groups. This includes fruits and vegetables, grains, dairy, and meat and other proteins. Include a protein-rich food at every meal and snack, and eat the protein food first.  You should be taking a Bariatric Multivitamin as well as calcium.     Morbid Obesity: Current BMI 41.76    Obesity Treatment / Action Plan:  Patient will work on garnering support from family and friends to begin weight loss journey. Will work on eliminating or reducing the presence of highly palatable, calorie dense foods in the home. Will complete provided nutritional and psychosocial assessment questionnaire before the next appointment. Will be scheduled for indirect calorimetry to determine resting energy expenditure in a fasting state.  This will allow us  to create a reduced calorie, high-protein meal plan to promote loss of fat mass  while preserving muscle mass. Counseled on the health benefits of losing 5%-15% of total body weight. Was counseled on nutritional approaches to weight loss and benefits of reducing processed foods and consuming plant-based foods and high quality protein as part of nutritional weight management. Was counseled on pharmacotherapy and role as an adjunct in weight management.   Obesity Education Performed Today:  She was weighed on the bioimpedance scale and results were discussed and documented in the synopsis.  We discussed obesity as a disease and the importance of a more detailed evaluation of all the factors contributing to the disease.  We discussed the importance of long term lifestyle changes which include nutrition, exercise and behavioral modifications as well as the importance of customizing this to her specific health and social needs.  We discussed the benefits of reaching a  healthier weight to alleviate the symptoms of existing conditions and reduce the risks of the biomechanical, metabolic and psychological effects of obesity.  Discussed New Patient/Late Arrival, and Cancellation Policies. Patient voiced understanding and allowed to ask questions.   [name] [name] appears to be in the action stage of change and states they are ready to start intensive lifestyle modifications and behavioral modifications.  30 minutes was spent today on this visit including the above counseling, pre-visit chart review, and post-visit documentation.  Reviewed by clinician on day of visit: allergies, medications, problem list, medical history, surgical history, family history, social history, and previous encounter notes.    Lakenzie Mcclafferty A. Delores CORDOBAO.

## 2024-05-12 LAB — CMP14+EGFR
ALT: 9 IU/L (ref 0–32)
AST: 15 IU/L (ref 0–40)
Albumin: 3.9 g/dL (ref 3.8–4.9)
Alkaline Phosphatase: 89 IU/L (ref 44–121)
BUN/Creatinine Ratio: 21 (ref 9–23)
BUN: 16 mg/dL (ref 6–24)
Bilirubin Total: 0.3 mg/dL (ref 0.0–1.2)
CO2: 22 mmol/L (ref 20–29)
Calcium: 9.3 mg/dL (ref 8.7–10.2)
Chloride: 102 mmol/L (ref 96–106)
Creatinine, Ser: 0.76 mg/dL (ref 0.57–1.00)
Globulin, Total: 2.9 g/dL (ref 1.5–4.5)
Glucose: 73 mg/dL (ref 70–99)
Potassium: 4.2 mmol/L (ref 3.5–5.2)
Sodium: 138 mmol/L (ref 134–144)
Total Protein: 6.8 g/dL (ref 6.0–8.5)
eGFR: 93 mL/min/1.73 (ref >59)

## 2024-05-12 LAB — VITAMIN D 25 HYDROXY (VIT D DEFICIENCY, FRACTURES): Vit D, 25-Hydroxy: 23.8 ng/mL — ABNORMAL LOW (ref 30.0–100.0)

## 2024-05-12 LAB — LIPID PANEL
Chol/HDL Ratio: 2.4 ratio (ref 0.0–4.4)
Cholesterol, Total: 219 mg/dL — ABNORMAL HIGH (ref 100–199)
HDL: 92 mg/dL (ref >39)
LDL Chol Calc (NIH): 111 mg/dL — ABNORMAL HIGH (ref 0–99)
Triglycerides: 90 mg/dL (ref 0–149)
VLDL Cholesterol Cal: 16 mg/dL (ref 5–40)

## 2024-05-12 LAB — CBC
Hematocrit: 40 % (ref 34.0–46.6)
Hemoglobin: 12.6 g/dL (ref 11.1–15.9)
MCH: 28.8 pg (ref 26.6–33.0)
MCHC: 31.5 g/dL (ref 31.5–35.7)
MCV: 92 fL (ref 79–97)
Platelets: 313 x10E3/uL (ref 150–450)
RBC: 4.37 x10E6/uL (ref 3.77–5.28)
RDW: 13.4 % (ref 11.7–15.4)
WBC: 5.5 x10E3/uL (ref 3.4–10.8)

## 2024-05-12 LAB — TSH: TSH: 0.44 u[IU]/mL — ABNORMAL LOW (ref 0.450–4.500)

## 2024-05-12 LAB — HIV ANTIBODY (ROUTINE TESTING W REFLEX): HIV Screen 4th Generation wRfx: NONREACTIVE

## 2024-05-14 ENCOUNTER — Encounter: Payer: Self-pay | Admitting: Family Medicine

## 2024-05-14 ENCOUNTER — Ambulatory Visit: Payer: Self-pay | Admitting: Family Medicine

## 2024-05-14 DIAGNOSIS — E042 Nontoxic multinodular goiter: Secondary | ICD-10-CM

## 2024-05-14 DIAGNOSIS — R7989 Other specified abnormal findings of blood chemistry: Secondary | ICD-10-CM

## 2024-05-14 MED ORDER — VITAMIN D (ERGOCALCIFEROL) 1.25 MG (50000 UNIT) PO CAPS: 50000.0000 [IU] | ORAL_CAPSULE | ORAL | 1 refills | Status: AC

## 2024-05-16 ENCOUNTER — Ambulatory Visit: Admitting: Bariatrics

## 2024-05-16 ENCOUNTER — Encounter: Payer: Self-pay | Admitting: Dermatology

## 2024-05-16 ENCOUNTER — Encounter: Payer: Self-pay | Admitting: Bariatrics

## 2024-05-16 ENCOUNTER — Ambulatory Visit: Payer: 59 | Admitting: Dermatology

## 2024-05-16 VITALS — BP 144/96 | HR 68 | Temp 97.5°F | Ht <= 58 in | Wt 194.0 lb

## 2024-05-16 VITALS — BP 130/77

## 2024-05-16 DIAGNOSIS — E559 Vitamin D deficiency, unspecified: Secondary | ICD-10-CM

## 2024-05-16 DIAGNOSIS — R030 Elevated blood-pressure reading, without diagnosis of hypertension: Secondary | ICD-10-CM

## 2024-05-16 DIAGNOSIS — L821 Other seborrheic keratosis: Secondary | ICD-10-CM

## 2024-05-16 DIAGNOSIS — Z0000 Encounter for general adult medical examination without abnormal findings: Secondary | ICD-10-CM

## 2024-05-16 DIAGNOSIS — R7989 Other specified abnormal findings of blood chemistry: Secondary | ICD-10-CM

## 2024-05-16 DIAGNOSIS — Z6841 Body Mass Index (BMI) 40.0 and over, adult: Secondary | ICD-10-CM

## 2024-05-16 DIAGNOSIS — Z1331 Encounter for screening for depression: Secondary | ICD-10-CM

## 2024-05-16 DIAGNOSIS — R5383 Other fatigue: Secondary | ICD-10-CM | POA: Diagnosis not present

## 2024-05-16 DIAGNOSIS — K219 Gastro-esophageal reflux disease without esophagitis: Secondary | ICD-10-CM

## 2024-05-16 DIAGNOSIS — R0602 Shortness of breath: Secondary | ICD-10-CM | POA: Diagnosis not present

## 2024-05-16 NOTE — Patient Instructions (Addendum)

## 2024-05-16 NOTE — Progress Notes (Signed)
 At a Glance:  Vitals Temp: (!) 97.5 F (36.4 C) BP: (!) 144/96 Pulse Rate: 68 SpO2: 100 %   Anthropometric Measurements Height: 4' 9 (1.448 m) Weight: 194 lb (88 kg) BMI (Calculated): 41.97 Starting Weight: 194lb Peak Weight: 230lb   Body Composition  Body Fat %: 47.3 % Fat Mass (lbs): 92 lbs Muscle Mass (lbs): 97.2 lbs Total Body Water (lbs): 72.6 lbs Visceral Fat Rating : 15   Other Clinical Data RMR: 1166 Fasting: yes Labs: yes Today's Visit #: 1 Starting Date: 05/16/24    EKG: Normal sinus rhythm, rate 66.  Indirect Calorimeter:   Resting Metabolic Rate ( RMR):  RMR (actual): 1166 kcal RMR (calculated): 1454 kcal The calculated basal metabolic rate is 8545 kcal thus her basal metabolic rate is worse than expected.  Plan:   Indirect calorimeter completed, interpreted and reviewed with patient today and allowed to ask questions.  Discussed the implications for the chosen plan and exercise based on the RMR reading.  Will consider repeating the RMR in the future based on weight loss.    Chief Complaint:  Obesity   Subjective:  MARELY APGAR (MR# [id number]) is a 53 y.o. female who presents for evaluation and treatment of obesity and related comorbidities.   Lurleen is currently in the action stage of change and ready to dedicate time achieving and maintaining a healthier weight. Rula is interested in becoming our patient and working on intensive lifestyle modifications including (but not limited to) diet and exercise for weight loss.  Sheli has been struggling with her weight. She has been unsuccessful in either losing weight, maintaining weight loss, or reaching her healthy weight goal.  Huntleigh's habits were reviewed today and are as follows: she thinks her family will eat healthier with her, she has been heavy most of her life, she is a picky eater and doesn't like to eat healthier foods, she snacks frequently in the evenings, and she has problems with  excessive hunger.  Current or previous pharmacotherapy: GLP-1 She was doing well with Wegovy  but only took it for approximately 1 month and her insurance stopped coverage.   Other Fatigue Tashala admits to daytime somnolence sometimes and admits to waking up still tired. Chloe generally gets 5 or 6 hours of sleep per night, and states that she has difficulty falling asleep. Snoring is present. Apneic episodes are present. Epworth Sleepiness Score is 3.   Shortness of Breath Zykira notes increasing shortness of breath with exercising and seems to be worsening over time with weight gain. She notes getting out of breath sooner with activity than she used to. This has not gotten worse recently. Brynnlie denies shortness of breath at rest or orthopnea.  Depression Screen Jadyn's Food and Mood (modified PHQ-9) score was 3. <5 no depression     04/10/2024    4:16 PM  Depression screen PHQ 2/9  Decreased Interest 0  Down, Depressed, Hopeless 0  PHQ - 2 Score 0     Assessment and Plan:   Other Fatigue Dayami does not feel that her weight is causing her energy to be lower than it should be. Fatigue may be related to obesity, depression or many other causes. Labs will be ordered, and in the meanwhile, Tela will focus on self care including making healthy food choices, increasing physical activity and focusing on stress reduction.  Shortness of Breath Khalaya does not feel that she gets out of breath more easily that she used to when she exercises. Teairra's  shortness of breath appears to be obesity related and exercise induced. She has agreed to work on weight loss and gradually increase exercise to treat her exercise induced shortness of breath. Will continue to monitor closely.  Health Maintenance:   Obesity   Plan: Will do EKG, indirect calorimetry, and labs.     Vitamin D  Deficiency Vitamin D  is at goal of 50.  Most recent vitamin D  level was 23.8. She is at risk for vitamin D  deficiency due to obesity.  She  is on  prescription ergocalciferol  50,000 IU weekly. Lab Results  Component Value Date   VD25OH 23.8 (L) 05/10/2024   VD25OH 28.6 (L) 01/31/2023   VD25OH 46.2 11/05/2021    Plan: Refill prescription vitamin D  50,000 IU weekly.  Will check for vitamin D  deficiency.   Elevated TSH:   She has thyroid  nodules and her last TSH is slightly elevated. She thinks that she took a thyroid  medication in the distant past.   Plan: She follow-up with her PCP after an US  of her thyroid . She has an appointment with an endocrinologist.   GERD:   She takes Prilosec  and her symptoms well controlled.    Plan:  Will follow-up with her PCP and GI.   Elevated blood pressure without diagnosis of hypertension Blood pressure is noted to be borderline elevated today,. Forest denies chest pains or SOB. BP Readings from Last 3 Encounters:  05/16/24 (!) 144/96  04/23/24 (!) 158/92  04/10/24 119/82    Plan: Continue to monitor.  If blood pressure continues to be elevated at future office visits, will consider starting antihypertensive medication. No added salt.    Previous labs reviewed today. Date: 05/10/24 CMP, Lipids, Vit D, and TSH, glucose, and CBC.   Labs done today Insulin  and Vit B12   Morbid Obesity: BMI (Calculated): 41.97   Kassidi is currently in the action stage of change and her goal is to begin weight loss efforts. I recommend Demetrice begin the structured treatment plan as follows:  She has agreed to Category 1 Plan  Exercise goals: All adults should avoid inactivity. Some activity is better than none, and adults who participate in any amount of physical activity, gain some health benefits.  Behavioral modification strategies:increasing lean protein intake, increasing vegetables, increase H2O intake, increase high fiber foods, no skipping meals, keeping healthy foods in the home, better snacking choices, avoiding temptations, and planning for success  She was informed of the importance of  frequent follow-up visits to maximize her success with intensive lifestyle modifications for her multiple health conditions. She was informed we would discuss her lab results at her next visit unless there is a critical issue that needs to be addressed sooner. Jozelyn agreed to keep her next visit at the agreed upon time to discuss these results.  Objective:  General: Cooperative, alert, well developed, in no acute distress. HEENT: Conjunctivae and lids unremarkable. Cardiovascular: Regular rhythm.  Lungs: Normal work of breathing. Neurologic: No focal deficits.   Lab Results  Component Value Date   CREATININE 0.76 05/10/2024   BUN 16 05/10/2024   NA 138 05/10/2024   K 4.2 05/10/2024   CL 102 05/10/2024   CO2 22 05/10/2024   Lab Results  Component Value Date   ALT 9 05/10/2024   AST 15 05/10/2024   ALKPHOS 89 05/10/2024   BILITOT 0.3 05/10/2024   Lab Results  Component Value Date   HGBA1C 5.7 (H) 08/28/2014   HGBA1C 5.5 07/09/2013   HGBA1C 5.3 07/05/2012  HGBA1C 5.4 07/05/2011   HGBA1C 5.6 07/13/2010   No results found for: INSULIN  Lab Results  Component Value Date   TSH 0.440 (L) 05/10/2024   Lab Results  Component Value Date   CHOL 219 (H) 05/10/2024   HDL 92 05/10/2024   LDLCALC 111 (H) 05/10/2024   TRIG 90 05/10/2024   CHOLHDL 2.4 05/10/2024   Lab Results  Component Value Date   WBC 5.5 05/10/2024   HGB 12.6 05/10/2024   HCT 40.0 05/10/2024   MCV 92 05/10/2024   PLT 313 05/10/2024   Lab Results  Component Value Date   IRON 51 11/23/2016   FERRITIN 45 11/23/2016    Attestation Statements:  Applicable history such as the following:  allergies, medications, problem list, medical history, surgical history, family history, social history, and previous encounter notes reviewed by clinician on day of visit:  Time spent on visit in care of the patient today including the items listed below was 52 minutes.    20 minutes were spent talking about the  history, 25 minutes for face to face counseling implementing the plan, discussing the specifics of how to arrange meals, meal planning, water intake.   I spent face to face time discussing his/her plan, including breakfast, additional breakfast options, lunch, and dinner options, grocery list, and snacks.  I reviewed her indirect calorimetry. I discussed the implications for the diet plan.  I also discussed specific reasons that impact metabolism.   Discussed the bio-impedence test (fat %, muscle mass, and water weight) and allowed the patient to ask questions.   Discussed the following information sheets: Category 1 , Grocery List, 100 Calorie Snacks, 200 Calorie Snacks, Microwave Meals, and healthy vs unhealthy snacks. .   I reviewed the labs which were ordered from her visit on 05/10/24,   I additionally spent time documenting, reviewing, and checking the codes before submitting.   This may have been prepared with the assistance of Engineer, civil (consulting).  Occasional wrong-word or sound-a-like substitutions may have occurred due to the inherent limitations of voice recognition software.    Clayborne Daring, DO

## 2024-05-16 NOTE — Progress Notes (Signed)
   New Patient Visit   Subjective  [name] [name] is a 53 y.o. female who presents for the following: Mole of left side of face that has been there for years but has spread over time.No history of skin cancer and she is not aware of any family history of skin cancer.    The following portions of the chart were reviewed this encounter and updated as appropriate: medications, allergies, medical history  Review of Systems:  No other skin or systemic complaints except as noted in HPI or Assessment and Plan.  Objective  Well appearing patient in no apparent distress; mood and affect are within normal limits.   A focused examination was performed of the following areas: Face   Relevant exam findings are noted in the Assessment and Plan.    Assessment & Plan   SEBORRHEIC KERATOSIS/DPN - Stuck-on, waxy, tan-brown papules and/or plaques  - Benign-appearing - Discussed benign etiology and prognosis. -Discussed treatment with LN2 and risk of hypopigmentation - patient declines today.  - Observe - Call for any changes      Return if symptoms worsen or fail to improve.  I, Roseline Hutchinson, CMA, am acting as scribe for [employer], DO .   Documentation: I have reviewed the above documentation for accuracy and completeness, and I agree with the above.  Delon Lenis, DO

## 2024-05-17 LAB — VITAMIN B12: Vitamin B-12: 526 pg/mL (ref 232–1245)

## 2024-05-17 LAB — INSULIN, RANDOM: INSULIN: 4.7 u[IU]/mL (ref 2.6–24.9)

## 2024-05-21 ENCOUNTER — Ambulatory Visit (HOSPITAL_COMMUNITY)
Admission: RE | Admit: 2024-05-21 | Discharge: 2024-05-21 | Disposition: A | Discharge status: Home / Self Care | Source: Ambulatory Visit | Attending: Family Medicine | Admitting: Family Medicine

## 2024-05-21 DIAGNOSIS — R7989 Other specified abnormal findings of blood chemistry: Secondary | ICD-10-CM | POA: Diagnosis present

## 2024-05-21 DIAGNOSIS — E042 Nontoxic multinodular goiter: Secondary | ICD-10-CM | POA: Insufficient documentation

## 2024-05-24 DIAGNOSIS — H04123 Dry eye syndrome of bilateral lacrimal glands: Secondary | ICD-10-CM | POA: Insufficient documentation

## 2024-05-24 DIAGNOSIS — N951 Menopausal and female climacteric states: Secondary | ICD-10-CM | POA: Insufficient documentation

## 2024-05-26 ENCOUNTER — Ambulatory Visit: Payer: Self-pay | Admitting: Family Medicine

## 2024-05-27 ENCOUNTER — Ambulatory Visit: Admitting: Orthopedic Surgery

## 2024-05-27 ENCOUNTER — Encounter: Payer: Self-pay | Admitting: Orthopedic Surgery

## 2024-05-27 VITALS — BP 109/67 | HR 81 | Ht <= 58 in | Wt 194.0 lb

## 2024-05-27 DIAGNOSIS — M2241 Chondromalacia patellae, right knee: Secondary | ICD-10-CM | POA: Diagnosis not present

## 2024-05-27 DIAGNOSIS — M2242 Chondromalacia patellae, left knee: Secondary | ICD-10-CM | POA: Diagnosis not present

## 2024-05-27 DIAGNOSIS — M76821 Posterior tibial tendinitis, right leg: Secondary | ICD-10-CM

## 2024-05-27 NOTE — Patient Instructions (Addendum)
Physical therapy has been ordered for you at [facility] They should call you to schedule, [phone]  is the phone number to call, if you want to call to schedule.   

## 2024-05-27 NOTE — Progress Notes (Signed)
   Chief Complaint  Patient presents with   Knee Pain    Bilateral- pain is much better today but still wanres to get it checked out my knees will buckle I got rid of my Burnetta and that has seemed to help   Foot Pain    Right- needs second opinion is scheduled for Wed but has a high copay and states that is a lot of money for 3 visits   History of present illness 53 year old female with bilateral knee pain occasional giving way no history of trauma or surgery to the knees.  She cannot really locate the pain really is just across the knee sort of.  Patient was going to see us  on Wednesday for second opinion regarding her right foot diagnosed with PTTD Therapy was ordered not started because therapist did not have a time until late September Patient does wear ASO brace and that seems to help Pain located medial side of foot with prominent navicular and severe pes planus which is bilateral but symptomatic on only 1 side  Medical history The patient's son has OI She thinks she may have had OI as she had multiple fractures as a child and so did her father no testing has been done  Past Medical History:  Diagnosis Date   Anxiety    Bursitis    Bursitis    Fibroid    GERD (gastroesophageal reflux disease)    Hypertension    seen by [facility]   Palpitations    PONV (postoperative nausea and vomiting)    Venous insufficiency    Vitamin D  deficiency     BP 109/67   Pulse 81   Ht 4' 10 (1.473 m)   Wt 194 lb (88 kg)   LMP 11/17/2015   BMI 40.55 kg/m   Examination of both feet shows severe pes planus with severe pronation this does correct with tiptoe standing she has abnormal single-leg heel rise she basically cannot do it at all  The tenderness is over the prominent navicular  Overall plantarflexion and inversion strength is 5- out of 5 Remaining musculature around the foot normal  Ankle feels stable   Bilateral knee examination all ligaments were  examined in classic positions and were stable McMurray signs negative  Patellofemoral irritation to palpation without effusion  Impression  Encounter Diagnoses  Name Primary?   Chondromalacia patellae of left knee Yes   Chondromalacia patellae of right knee    Insufficiency of right posterior tibial tendon    Recommend physical therapy for all 3 entities  No surgical indications at this time

## 2024-05-27 NOTE — Progress Notes (Signed)
  Intake history:  Chief Complaint  Patient presents with   Knee Pain    Bilateral- pain is much better today but still wanres to get it checked out my knees will buckle I got rid of my Burnetta and that has seemed to help   Foot Pain    Right- needs second opinion is scheduled for Wed but has a high copay and states that is a lot of money for 3 visits     BP 109/67   Pulse 81   Ht 4' 10 (1.473 m)   Wt 194 lb (88 kg)   LMP 11/17/2015   BMI 40.55 kg/m  Body mass index is 40.55 kg/m.    WHAT ARE WE SEEING YOU FOR TODAY?   bilateral knee(s)  How Deja Pisarski has this bothered you? (DOI?DOS?WS?)  3 month(s) ago  Was there an injury? No  Anticoag.  No  Diabetes No  Heart disease No  Hypertension No  SMOKING HX No  Kidney disease No  Any ALLERGIES ______________________________________________   Treatment:  Have you taken:  Tylenol  Yes  Advil Yes  Had PT No  Had injection No  Other  ____________an all natural product_____________

## 2024-05-29 ENCOUNTER — Encounter: Admitting: Orthopedic Surgery

## 2024-05-30 ENCOUNTER — Ambulatory Visit: Admitting: Podiatry

## 2024-06-04 ENCOUNTER — Encounter (HOSPITAL_COMMUNITY): Payer: Self-pay

## 2024-06-04 ENCOUNTER — Ambulatory Visit (HOSPITAL_COMMUNITY): Attending: Podiatry

## 2024-06-04 ENCOUNTER — Other Ambulatory Visit: Payer: Self-pay

## 2024-06-04 DIAGNOSIS — M25562 Pain in left knee: Secondary | ICD-10-CM | POA: Insufficient documentation

## 2024-06-04 DIAGNOSIS — Z7409 Other reduced mobility: Secondary | ICD-10-CM | POA: Insufficient documentation

## 2024-06-04 DIAGNOSIS — M79671 Pain in right foot: Secondary | ICD-10-CM | POA: Diagnosis present

## 2024-06-04 DIAGNOSIS — M25561 Pain in right knee: Secondary | ICD-10-CM | POA: Diagnosis present

## 2024-06-04 DIAGNOSIS — M76821 Posterior tibial tendinitis, right leg: Secondary | ICD-10-CM | POA: Diagnosis not present

## 2024-06-04 NOTE — Therapy (Signed)
 OUTPATIENT PHYSICAL THERAPY LOWER EXTREMITY EVALUATION   Patient Name: [name] [name] MRN: [id number] DOB:14-Dec-1970, 53 y.o., female Today's Date: 06/06/2024  END OF SESSION:  PT End of Session - 06/05/24 1733     Visit Number 1    Date for PT Re-Evaluation 07/17/24    Authorization Type [employer]    Authorization Time Period no auth    Progress Note Due on Visit 10    PT Start Time 1601    PT Stop Time 1642    PT Time Calculation (min) 41 min    Activity Tolerance Patient tolerated treatment well;Patient limited by pain    Behavior During Therapy WFL for tasks assessed/performed          Past Medical History:  Diagnosis Date   Anxiety    Bursitis    Bursitis    Fibroid    GERD (gastroesophageal reflux disease)    Hypertension    seen by [facility]   Palpitations    PONV (postoperative nausea and vomiting)    Venous insufficiency    Vitamin D  deficiency    Past Surgical History:  Procedure Laterality Date   ABDOMINAL HYSTERECTOMY  01/16/2016 [facility])   partial for fibroids   CESAREAN SECTION     COLONOSCOPY WITH PROPOFOL  N/A 02/10/2021   Procedure: COLONOSCOPY WITH PROPOFOL ;  Surgeon: Golda Claudis PENNER, MD;  Location: [facility];  Service: Endoscopy;  Laterality: N/A;  11:00   DILATION AND CURETTAGE OF UTERUS     HYSTEROSCOPY WITH D & C  04/02/2012   Procedure: DILATATION AND CURETTAGE /HYSTEROSCOPY;  Surgeon: Dickie DELENA Carder, MD;  Location: [facility];  Service: Gynecology;  Laterality: N/A;   LAPAROSCOPIC GASTRIC SLEEVE RESECTION WITH HIATAL HERNIA REPAIR N/A 08/15/2016   Procedure: LAPAROSCOPIC GASTRIC SLEEVE RESECTION WITH HIATAL HERNIA REPAIR WITH UPPER ENDO;  Surgeon: Donnice Lunger, MD;  Location: [facility];  Service: General;  Laterality: N/A;   LAPAROSCOPIC TUBAL LIGATION  04/02/2012   Procedure: LAPAROSCOPIC TUBAL LIGATION;  Surgeon: Dickie DELENA Carder, MD;  Location: [facility];  Service: Gynecology;  Laterality: Bilateral;    TRANSTHORACIC ECHOCARDIOGRAM  01/11/2009   EF >55%, normal   Patient Active Problem List   Diagnosis Date Noted   Dry eyes 05/24/2024   Menopausal and female climacteric states 05/24/2024   Ankle pain, right 04/10/2024   Hyperpigmented skin lesion 10/17/2023   Multinodular goiter 12/30/2016   Abnormal TSH 11/24/2016   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 2017 08/15/2016   Allergic sinusitis 03/14/2016   Intermittent palpitations 12/12/2015   Paroxysmal atrial tachycardia (HCC) 12/12/2015   Insomnia 11/23/2015   Knee pain, left 12/16/2014   Morbid obesity (HCC) 11/20/2012   Vitamin D  deficiency 11/20/2012   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   ANXIETY DISORDER, GENERALIZED 07/13/2007   Allergic rhinitis 07/13/2007   GERD 07/13/2007    PCP: Antonetta Rollene BRAVO, MD   REFERRING PROVIDER: Margrette Taft BRAVO, MD  REFERRING DIAG: 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.42 (ICD-10-CM) - Chondromalacia patellae of left knee M22.41 (ICD-10-CM) - Chondromalacia patellae of right knee M76.821 (ICD-10-CM) - Insufficiency of right posterior tibial tendon  THERAPY DIAG:  Pain in both knees, unspecified chronicity - Plan: PT plan of care cert/re-cert  Pain in right foot - Plan: PT plan of care cert/re-cert  Impaired functional mobility and activity tolerance - Plan: PT plan of care cert/re-cert  Rationale for Evaluation and Treatment: Rehabilitation  ONSET DATE: Over 6 months  SUBJECTIVE:   SUBJECTIVE STATEMENT: Pt states she has  been dealing with random bouts of bilateral knee pain, sharp anterior knee pain. Pt states thankfully she has not fallen but has had some close calls. Pt states she has been wearing a right ankle brace since the end of July. Pt reports the tendon has stretched overtime due to flat feet. Pt states she will only be able to come one time per week due to son breaking his femur. Reports sedentary work life but has tried to improve this.  PERTINENT HISTORY:  None  reported  PAIN:  Are you having pain? Yes: NPRS scale: 5-6/10 Pain location: anterior knee, medial arch of right foot Pain description: sharp for knee, soreness/hurting for the foot, lasts Aggravating factors: standing for a prolonged period of time Relieving factors: pain meds, elevation  PRECAUTIONS: None  RED FLAGS: None   WEIGHT BEARING RESTRICTIONS: No  FALLS:  Has patient fallen in last 6 months? No  OCCUPATION: magistrate  PLOF: Independent and Independent with basic ADLs  PATIENT GOALS: Pt states to decreased bilateral knee pain, decreased right foot pain.  NEXT MD VISIT: after therapy  OBJECTIVE:  Note: Objective measures were completed at Evaluation unless otherwise noted.  DIAGNOSTIC FINDINGS: CLINICAL DATA:  Left knee pain for over a year.   EXAM: MRI OF THE LEFT KNEE WITHOUT CONTRAST   TECHNIQUE: Multiplanar, multisequence MR imaging of the knee was performed. No intravenous contrast was administered.   COMPARISON:  None.   FINDINGS: MENISCI   Medial meniscus: Degenerative signal in the posterior horn with mild free edge fraying. No discrete meniscal tear or displaced meniscal fragment. The meniscal root appears intact.   Lateral meniscus:  Intact with normal morphology.   LIGAMENTS   Cruciates:  Intact.   Collaterals:  Intact.   CARTILAGE   Patellofemoral:  Preserved.   Medial: Mild chondral thinning and surface irregularity without full-thickness chondral defect or subchondral signal abnormality.   Lateral:  Preserved.   MISCELLANEOUS   Joint:  No significant joint effusion.   Popliteal Fossa: The popliteus muscle and tendon are intact. Small septated Baker's cyst.   Extensor Mechanism:  Intact.   Bones:  No acute or significant extra-articular osseous findings.   Other: Mild edema superolaterally in Hoffa's fat.   IMPRESSION: 1. Mild medial compartment degenerative changes. No acute osseous findings. 2. Mild degeneration  of the posterior horn of the medial meniscus with free edge fraying. No discrete meniscal tear or displaced meniscal fragment. 3. The lateral meniscus, cruciate and collateral ligaments are intact. 4. Small septated Baker's cyst from  PATIENT SURVEYS:  LEFS: 67 / 80 = 83.8 %   SENSATION: WFL  EDEMA:  Pt reports no swelling of knees today   PALPATION: Slight tenderness reported on medial joint line of R knee and bony prominence on medal R foot.  LOWER EXTREMITY ROM:  Active ROM Right eval Left eval  Hip flexion    Hip extension    Hip abduction    Hip adduction    Hip internal rotation    Hip external rotation    Knee flexion Decreased, TBA formally next session Decreased, TBA formally next session  Knee extension    Ankle dorsiflexion    Ankle plantarflexion    Ankle inversion    Ankle eversion     (Blank rows = not tested)  LOWER EXTREMITY MMT:  MMT Right eval Left eval  Hip flexion    Hip extension    Hip abduction    Hip adduction    Hip internal  rotation    Hip external rotation    Knee flexion 3- 3+  Knee extension 3 3  Ankle dorsiflexion    Ankle plantarflexion    Ankle inversion    Ankle eversion     (Blank rows = not tested)  LOWER EXTREMITY SPECIAL TESTS:  None reported  FUNCTIONAL TESTS:  5 times sit to stand: 17.76 s 2 minute walk test: TBA  GAIT: Distance walked: 80 feet to and from treatment area Assistive device utilized: None Level of assistance: Complete Independence Comments: pt demonstrates decreased gait speed and qaulity, slight antalgic gait noted with decreased stride length and velocity noted.                                                                                                                                TREATMENT DATE:  06/04/2024  Evaluation: -ROM measured, Strength assessed, HEP prescribed, pt educated on prognosis, findings, and importance of HEP compliance if given.     PATIENT EDUCATION:   Education details: Pt was educated on findings of PT evaluation, prognosis, frequency of therapy visits and rationale, attendance policy, and HEP if given.   Person educated: Patient Education method: Explanation, Verbal cues, and Handouts Education comprehension: verbalized understanding, verbal cues required, and needs further education  HOME EXERCISE PROGRAM: Access Code: [email or web address] URL: [email or web address] Date: 06/05/2024 Prepared by: Lang Ada  Exercises - Supine Bridge  - 1 x daily - 7 x weekly - 3 sets - 10 reps - Straight Leg Raise  - 1 x daily - 7 x weekly - 3 sets - 10 reps - Sidelying Hip Abduction  - 1 x daily - 7 x weekly - 3 sets - 10 reps - Standing Heel Raise  - 1 x daily - 7 x weekly - 3 sets - 10 reps - Heel Toe Raises with Counter Support  - 1 x daily - 7 x weekly - 3 sets - 10 reps - Seated Long Arc Quad  - 1 x daily - 7 x weekly - 3 sets - 10 reps  ASSESSMENT:  CLINICAL IMPRESSION: Patient is a 53 y.o. 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   Patient demonstrates increased pain in bilateral knees, right foot, decreased LE strength, abnormal gait pattern, and impaired functional mobility. Patient also demonstrates difficulty with ambulation during today's session with antalgic gait, decreased stride length, and decreased velocity noted. Patient also demonstrates signs and symptoms of sedentary work life which likely has contributed to pts current condition. Patient educated on the likely benefits that will be observed with initiation of consistent physical activity and HEP. Patient would benefit from skilled physical therapy for decreased knee and right foot pain, increased endurance with ambulation, increased LE strength, and  balance for improved gait quality, return  to higher level of function with ADLs, and progress towards therapy goals.   OBJECTIVE IMPAIRMENTS: Abnormal gait, decreased activity tolerance, decreased balance, decreased endurance, decreased knowledge of use of DME, decreased mobility, difficulty walking, decreased ROM, decreased strength, and pain.   ACTIVITY LIMITATIONS: carrying, lifting, bending, sitting, standing, squatting, stairs, transfers, and bed mobility  PARTICIPATION LIMITATIONS: meal prep, cleaning, laundry, driving, shopping, community activity, and yard work  PERSONAL FACTORS: Age, Fitness, Past/current experiences, and Time since onset of injury/illness/exacerbation are also affecting patient's functional outcome.   REHAB POTENTIAL: Fair sedentary lifestyle  CLINICAL DECISION MAKING: Stable/uncomplicated  EVALUATION COMPLEXITY: Low   GOALS: Goals reviewed with patient? No  SHORT TERM GOALS: Target date: 06/26/24  Patient will demonstrate evidence of independence with individualized HEP and will report compliance for at least 3 days per week for optimized progression towards remaining therapy goals. Baseline:  Goal status: INITIAL  2.  Patient will report a decrease in pain level during community ambulation by at least 2 points for improved quality of life. Baseline: 5-6/10 Goal status: INITIAL     LONG TERM GOALS: Target date: 07/17/24  Pt will demonstrate a an increase of at least 9 points on the LEFS for improved performance of community ambulation and ADL. Baseline: see objective Goal status: INITIAL  2.  Pt will improve 2 MWT by 40 feet in order to demonstrate improved functional ambulatory capacity in community setting.  Baseline: see objective Goal status: INITIAL  3.  Pt will demonstrate WFL ROM (flexion and extension) in right knee, for increased mobility and maximal efficiency of gait cycle during ambulation. Baseline: see objective Goal status:  INITIAL  4.  Pt will demonstrate at least 4-/5 MMT for right lower extremity for increased strength during ADL and community ambulation. Baseline: see objective Goal status: INITIAL  5.  Pt will improve 5TSTS by at least 2.3 seconds in order to improve strength during functional transfers and community navigation. Baseline: see objective Goal status: INITIAL    PLAN:  PT FREQUENCY: 1x/week  PT DURATION: 6 weeks  PLANNED INTERVENTIONS: 97110-Therapeutic exercises, 97530- Therapeutic activity, 97112- Neuromuscular re-education, 97535- Self Care, [id number]- Manual therapy, [phone]- Gait training, Patient/Family education, Balance training, Stair training, Taping, Joint mobilization, DME instructions, Cryotherapy, and Moist heat  PLAN FOR NEXT SESSION: , SLS assessment, Knee ROM measurement, encourage walking program and health benefits of more active lifestyle   Lang Ada, PT, DPT [facility] Office: [phone] 7:59 AM, 06/06/24

## 2024-06-05 ENCOUNTER — Encounter (HOSPITAL_COMMUNITY): Payer: Self-pay

## 2024-06-05 ENCOUNTER — Ambulatory Visit: Admitting: Bariatrics

## 2024-06-05 ENCOUNTER — Encounter: Payer: Self-pay | Admitting: Bariatrics

## 2024-06-05 VITALS — BP 134/86 | HR 67 | Temp 97.8°F | Ht <= 58 in | Wt 192.0 lb

## 2024-06-05 DIAGNOSIS — R7989 Other specified abnormal findings of blood chemistry: Secondary | ICD-10-CM

## 2024-06-05 DIAGNOSIS — E559 Vitamin D deficiency, unspecified: Secondary | ICD-10-CM | POA: Diagnosis not present

## 2024-06-05 DIAGNOSIS — E7800 Pure hypercholesterolemia, unspecified: Secondary | ICD-10-CM

## 2024-06-05 DIAGNOSIS — R632 Polyphagia: Secondary | ICD-10-CM

## 2024-06-05 DIAGNOSIS — Z6841 Body Mass Index (BMI) 40.0 and over, adult: Secondary | ICD-10-CM

## 2024-06-05 MED ORDER — CONTRAVE 8-90 MG PO TB12: ORAL_TABLET | ORAL | 0 refills | Status: DC

## 2024-06-05 NOTE — Progress Notes (Unsigned)
 First follow-up after initial visit.        WEIGHT SUMMARY AND BIOMETRICS  Weight Lost Since Last Visit: 2lb  Weight Gained Since Last Visit: 0   Vitals Temp: 97.8 F (36.6 C) BP: 134/86 Pulse Rate: 67 SpO2: 100 %   Anthropometric Measurements Height: 4' 9 (1.448 m) Weight: 192 lb (87.1 kg) BMI (Calculated): 41.54 Weight at Last Visit: 194lb Weight Lost Since Last Visit: 2lb Weight Gained Since Last Visit: 0 Starting Weight: 194lb Total Weight Loss (lbs): 2 lb (0.907 kg) Peak Weight: 230lb Waist Measurement : 48 inches   Body Composition  Body Fat %: 46.7 % Fat Mass (lbs): 89.6 lbs Muscle Mass (lbs): 97.2 lbs Total Body Water (lbs): 70.2 lbs Visceral Fat Rating : 15   Other Clinical Data Fasting: no Labs: no Today's Visit #: 2 Starting Date: 05/16/24    OBESITY [name] [name] is here to discuss her progress with her obesity treatment plan along with follow-up of her obesity related diagnoses.    Nutrition Plan: the Category 1 plan - 80% adherence.  Current exercise: none  Interim History:  She is down 2 lbs since her last visit.  Eating all of the food on the plan., Protein intake is as prescribed, Is not skipping meals, and Water intake is inadequate.  Initial positives regarding the dietary plan: No fried foods, or sweets.  Initial challenges regarding  the dietary plan: She is struggling with not drinking any sodas at this time. She is drinking fewer sodas.   Hunger is moderately controlled.  Cravings are moderately controlled.  Assessment/Plan:   Elevated cholesterol:  Her last labs showed elevated total cholesterol and an elevated LDL.  Plan:  Will continue the plan and exercise. Will repeat her lipid panel in the near future.  Vitamin D  Deficiency Vitamin D  is not at goal of 50.  Most recent vitamin D  level was 23.8. She is on   prescription ergocalciferol  50,000 IU weekly. Lab Results  Component Value Date   VD25OH 23.8 (L) 05/10/2024   VD25OH 28.6 (L) 01/31/2023   VD25OH 46.2 11/05/2021    Plan: Continue prescription vitamin D  50,000 IU weekly.   Low TSH:   She has a history of a multinodular goiter.  She states that she had an ultrasound of her thyroid  approximately 2 weeks ago and that it was negative except for benign thyroid  nodules.  She denies a family history of thyroid  disease.   Plan: Will recheck her TSH and thyroid  panel in the near future.  She will follow-up with her endocrinologist in regard to her multinodular goiter.  Polyphagia [name] [name] endorses excessive hunger.  Medication(s): none Effects of medication (appetite):  moderately controlled. Cravings are moderately controlled.   Plan: Medication(s): Will be given Contrave  and will titrate accordingly.  Rx: 8-90 milligram Contrave  will start 1 in the AM and then titrate accordingly, 120 tablets with no refills.  I discussed the side effects risks and benefits of the medication and she denies any issues.  She denies history of a seizure and she rarely has a cocktail and if she does she only has 1 at a given time. She may not be a candidate for Qsymia secondary to her history of paroxysmal atrial tachycardia, and additionally she broke out in a rash with phentermine  in the past.  Will increase water, protein and fiber to help assuage hunger.  Will minimize foods that have a high glucose index/load to minimize reactive hypoglycemia.    Morbid Obesity: Current BMI BMI (  Calculated): 41.54   Pharmacotherapy Plan Start  Contrave   [name] [name] is currently in the action stage of change. As such, her goal is to continue with weight loss efforts.  She has agreed to the Category 1 plan.  Exercise goals: All adults should avoid inactivity. Some physical activity is better than none, and adults who participate in any amount of physical activity gain some health  benefits.  Behavioral modification strategies: increasing lean protein intake, decreasing simple carbohydrates , no meal skipping, decrease eating out, meal planning , planning for success, increasing vegetables, avoiding temptations, and keep healthy foods in the home.  [name] [name] has agreed to follow-up with our clinic in 2 weeks.   Labs reviewed today from last visit (CMP, Lipids, insulin , vitamin D , B 12, and TSH).   Objective:   VITALS: Per patient if applicable, see vitals. GENERAL: Alert and in no acute distress. CARDIOPULMONARY: No increased WOB. Speaking in clear sentences.  PSYCH: Pleasant and cooperative. Speech normal rate and rhythm. Affect is appropriate. Insight and judgement are appropriate. Attention is focused, linear, and appropriate.  NEURO: Oriented as arrived to appointment on time with no prompting.   Attestation Statements:    This was prepared with the assistance of Engineer, civil (consulting).  Occasional wrong-word or sound-a-like substitutions may have occurred due to the inherent limitations of voice recognition software.   Clayborne Daring, DO

## 2024-06-09 ENCOUNTER — Other Ambulatory Visit: Payer: Self-pay | Admitting: Family Medicine

## 2024-06-12 ENCOUNTER — Ambulatory Visit (HOSPITAL_COMMUNITY)

## 2024-06-12 DIAGNOSIS — Z7409 Other reduced mobility: Secondary | ICD-10-CM

## 2024-06-12 DIAGNOSIS — M79671 Pain in right foot: Secondary | ICD-10-CM

## 2024-06-12 DIAGNOSIS — M25561 Pain in right knee: Secondary | ICD-10-CM | POA: Diagnosis not present

## 2024-06-12 NOTE — Therapy (Signed)
 OUTPATIENT PHYSICAL THERAPY LOWER EXTREMITY TREATMENT   Patient Name: [name] [name] MRN: [id number] DOB:05-04-1971, 53 y.o., female Today's Date: 06/12/2024  END OF SESSION:  PT End of Session - 06/12/24 1254     Visit Number 2    Number of Visits 6    Date for Recertification  07/17/24    Authorization Type [employer]    Authorization Time Period no auth    Progress Note Due on Visit 10    PT Start Time 1253   late check in   PT Stop Time 1330    PT Time Calculation (min) 37 min    Activity Tolerance Patient tolerated treatment well;Patient limited by pain    Behavior During Therapy WFL for tasks assessed/performed           Past Medical History:  Diagnosis Date   Anxiety    Bursitis    Bursitis    Fibroid    GERD (gastroesophageal reflux disease)    Hypertension    seen by [facility]   Palpitations    PONV (postoperative nausea and vomiting)    Venous insufficiency    Vitamin D  deficiency    Past Surgical History:  Procedure Laterality Date   ABDOMINAL HYSTERECTOMY  01/16/2016 [facility])   partial for fibroids   CESAREAN SECTION     COLONOSCOPY WITH PROPOFOL  N/A 02/10/2021   Procedure: COLONOSCOPY WITH PROPOFOL ;  Surgeon: Golda Claudis PENNER, MD;  Location: [facility];  Service: Endoscopy;  Laterality: N/A;  11:00   DILATION AND CURETTAGE OF UTERUS     HYSTEROSCOPY WITH D & C  04/02/2012   Procedure: DILATATION AND CURETTAGE /HYSTEROSCOPY;  Surgeon: Dickie DELENA Carder, MD;  Location: [facility];  Service: Gynecology;  Laterality: N/A;   LAPAROSCOPIC GASTRIC SLEEVE RESECTION WITH HIATAL HERNIA REPAIR N/A 08/15/2016   Procedure: LAPAROSCOPIC GASTRIC SLEEVE RESECTION WITH HIATAL HERNIA REPAIR WITH UPPER ENDO;  Surgeon: Donnice Lunger, MD;  Location: [facility];  Service: General;  Laterality: N/A;   LAPAROSCOPIC TUBAL LIGATION  04/02/2012   Procedure: LAPAROSCOPIC TUBAL LIGATION;  Surgeon: Dickie DELENA Carder, MD;  Location: [facility];  Service:  Gynecology;  Laterality: Bilateral;   TRANSTHORACIC ECHOCARDIOGRAM  01/11/2009   EF >55%, normal   Patient Active Problem List   Diagnosis Date Noted   Dry eyes 05/24/2024   Menopausal and female climacteric states 05/24/2024   Ankle pain, right 04/10/2024   Hyperpigmented skin lesion 10/17/2023   Multinodular goiter 12/30/2016   Abnormal TSH 11/24/2016   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 2017 08/15/2016   Allergic sinusitis 03/14/2016   Intermittent palpitations 12/12/2015   Paroxysmal atrial tachycardia 12/12/2015   Insomnia 11/23/2015   Knee pain, left 12/16/2014   Morbid obesity (HCC) 11/20/2012   Vitamin D  deficiency 11/20/2012   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   ANXIETY DISORDER, GENERALIZED 07/13/2007   Allergic rhinitis 07/13/2007   GERD 07/13/2007    PCP: Antonetta Rollene BRAVO, MD   REFERRING PROVIDER: Margrette Taft BRAVO, MD  REFERRING DIAG: 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    EVAL:Pt states she has been dealing with random bouts of bilateral knee pain, sharp anterior knee pain. Pt states thankfully she has not fallen but has had some close calls. Pt states she has been wearing a right ankle brace since the end of July. Pt reports the tendon has stretched overtime due to flat feet. Pt states she will only be able to come one time per week due to son breaking his femur. Reports sedentary work life but has tried to improve  this.  PERTINENT HISTORY:  None reported  PAIN:  Are you having pain? Yes: NPRS scale: 5-6/10 Pain location: anterior knee, medial arch of right foot Pain description: sharp for knee, soreness/hurting for the foot, lasts Aggravating factors: standing for a prolonged period of time Relieving factors: pain meds, elevation  PRECAUTIONS: None  RED FLAGS: None   WEIGHT BEARING RESTRICTIONS: No  FALLS:  Has patient fallen in last 6 months? No  OCCUPATION: magistrate  PLOF: Independent and Independent with basic ADLs  PATIENT GOALS: Pt states to decreased bilateral knee pain, decreased right foot pain.  NEXT MD VISIT: after therapy  OBJECTIVE:  Note: Objective measures were completed at Evaluation unless otherwise noted.  DIAGNOSTIC FINDINGS: CLINICAL DATA:  Left knee pain for over a year.   EXAM: MRI OF THE LEFT KNEE WITHOUT CONTRAST   TECHNIQUE: Multiplanar, multisequence MR imaging of the knee was performed. No intravenous contrast was administered.   COMPARISON:  None.   FINDINGS: MENISCI   Medial meniscus: Degenerative signal in the posterior horn with mild free edge fraying. No discrete meniscal tear or displaced meniscal fragment. The meniscal root appears intact.   Lateral meniscus:  Intact with normal morphology.   LIGAMENTS   Cruciates:  Intact.   Collaterals:  Intact.   CARTILAGE   Patellofemoral:  Preserved.   Medial: Mild chondral thinning and surface irregularity without full-thickness chondral defect or subchondral signal abnormality.   Lateral:  Preserved.   MISCELLANEOUS   Joint:  No significant joint effusion.   Popliteal Fossa: The popliteus muscle and tendon are intact. Small septated Baker's cyst.   Extensor Mechanism:  Intact.   Bones:  No acute or significant extra-articular osseous findings.   Other: Mild edema superolaterally in Hoffa's fat.   IMPRESSION: 1. Mild medial compartment degenerative changes. No acute  osseous findings. 2. Mild degeneration of the posterior horn of the medial meniscus with free edge fraying. No discrete meniscal tear or displaced meniscal fragment. 3. The lateral meniscus, cruciate and collateral ligaments are intact. 4. Small septated Baker's cyst from  PATIENT SURVEYS:  LEFS: 67 / 80 = 83.8 %   SENSATION: WFL  EDEMA:  Pt reports no swelling of knees today   PALPATION: Slight tenderness reported on medial joint line of R knee and bony prominence on medal R foot.  LOWER EXTREMITY ROM:  Active ROM Right 06/12/24 Left 06/12/24  Hip flexion    Hip extension    Hip abduction    Hip adduction    Hip internal rotation    Hip external rotation    Knee flexion 115  112  Knee extension 0 0  Ankle dorsiflexion    Ankle plantarflexion    Ankle inversion    Ankle eversion     (Blank rows = not tested)  LOWER EXTREMITY MMT:  MMT Right eval Left eval  Hip flexion    Hip extension    Hip abduction    Hip adduction  Hip internal rotation    Hip external rotation    Knee flexion 3- 3+  Knee extension 3 3  Ankle dorsiflexion    Ankle plantarflexion    Ankle inversion    Ankle eversion     (Blank rows = not tested)  LOWER EXTREMITY SPECIAL TESTS:  None reported  FUNCTIONAL TESTS:  5 times sit to stand: 17.76 s 2 minute walk test: TBA 281 ft  GAIT: Distance walked: 80 feet to and from treatment area Assistive device utilized: None Level of assistance: Complete Independence Comments: pt demonstrates decreased gait speed and qaulity, slight antalgic gait noted with decreased stride length and velocity noted.                                                                                                                                TREATMENT DATE:  06/12/2024 2 MWT 481 ft  Goal review SLS AROM of knees see above Right leg appears longer than the left leg in supine today Supine: Bridge x 10 SLR x 10 each Sidelying : Hip abduction x 10  each Clam x 10 each Sitting  LAQ's x 10 each Leg length measurement ASIS to medial malleoli Right 73 1/2 cm; left 75 and 1/4 cm    06/04/2024  Evaluation: -ROM measured, Strength assessed, HEP prescribed, pt educated on prognosis, findings, and importance of HEP compliance if given.     PATIENT EDUCATION:  Education details: Pt was educated on findings of PT evaluation, prognosis, frequency of therapy visits and rationale, attendance policy, and HEP if given.   Person educated: Patient Education method: Explanation, Verbal cues, and Handouts Education comprehension: verbalized understanding, verbal cues required, and needs further education  HOME EXERCISE PROGRAM: Access Code: [email or web address] URL: [email or web address] Date: 06/05/2024 Prepared by: Lang Ada  Exercises - Supine Bridge  - 1 x daily - 7 x weekly - 3 sets - 10 reps - Straight Leg Raise  - 1 x daily - 7 x weekly - 3 sets - 10 reps - Sidelying Hip Abduction  - 1 x daily - 7 x weekly - 3 sets - 10 reps - Standing Heel Raise  - 1 x daily - 7 x weekly - 3 sets - 10 reps - Heel Toe Raises with Counter Support  - 1 x daily - 7 x weekly - 3 sets - 10 reps - Seated Long Arc Quad  - 1 x daily - 7 x weekly - 3 sets - 10 reps  ASSESSMENT:  CLINICAL IMPRESSION: Today's session started with a 2 MWT.  Of note she has bilateral flat feet and over pronation with ambulation.  Reviewed goals with patient and she verbalizes agreement with set rehab goals.  AROM of bilateral knee see above.  Leg length measure noted left longer than right despite appearances of longer right femur.  Reviewed HEP and made adjustments as needed.  Added clam to HEP today.  Patient will benefit from  continued skilled therapy services to address deficits and promote return to optimal function.       Eval: Patient is a 53 y.o. female who was seen today for physical therapy evaluation and treatment for M22.42 (ICD-10-CM) - Chondromalacia patellae  of left knee M22.41 (ICD-10-CM) - Chondromalacia patellae of right knee M76.821 (ICD-10-CM) - Insufficiency of right posterior tibial tendon.   Patient demonstrates increased pain in bilateral knees, right foot, decreased LE strength, abnormal gait pattern, and impaired functional mobility. Patient also demonstrates difficulty with ambulation during today's session with antalgic gait, decreased stride length, and decreased velocity noted. Patient also demonstrates signs and symptoms of sedentary work life which likely has contributed to pts current condition. Patient educated on the likely benefits that will be observed with initiation of consistent physical activity and HEP. Patient would benefit from skilled physical therapy for decreased knee and right foot pain, increased endurance with ambulation, increased LE strength, and balance for improved gait quality, return to higher level of function with ADLs, and progress towards therapy goals.   OBJECTIVE IMPAIRMENTS: Abnormal gait, decreased activity tolerance, decreased balance, decreased endurance, decreased knowledge of use of DME, decreased mobility, difficulty walking, decreased ROM, decreased strength, and pain.   ACTIVITY LIMITATIONS: carrying, lifting, bending, sitting, standing, squatting, stairs, transfers, and bed mobility  PARTICIPATION LIMITATIONS: meal prep, cleaning, laundry, driving, shopping, community activity, and yard work  PERSONAL FACTORS: Age, Fitness, Past/current experiences, and Time since onset of injury/illness/exacerbation are also affecting patient's functional outcome.   REHAB POTENTIAL: Fair sedentary lifestyle  CLINICAL DECISION MAKING: Stable/uncomplicated  EVALUATION COMPLEXITY: Low   GOALS: Goals reviewed with patient? No  SHORT TERM GOALS: Target date: 06/26/24  Patient will demonstrate evidence of independence with individualized HEP and will report compliance for at least 3 days per week for optimized  progression towards remaining therapy goals. Baseline:  Goal status: IN PROGRESS  2.  Patient will report a decrease in pain level during community ambulation by at least 2 points for improved quality of life. Baseline: 5-6/10 Goal status: IN PROGRESS     LONG TERM GOALS: Target date: 07/17/24  Pt will demonstrate a an increase of at least 9 points on the LEFS for improved performance of community ambulation and ADL. Baseline: see objective Goal status: IN PROGRESS  2.  Pt will improve 2 MWT by 40 feet in order to demonstrate improved functional ambulatory capacity in community setting.  Baseline: see objective Goal status: IN PROGRESS  3.  Pt will demonstrate WFL ROM (flexion and extension) in right knee, for increased mobility and maximal efficiency of gait cycle during ambulation. Baseline: see objective Goal status: IN PROGRESS  4.  Pt will demonstrate at least 4-/5 MMT for right lower extremity for increased strength during ADL and community ambulation. Baseline: see objective Goal status: IN PROGRESS  5.  Pt will improve 5TSTS by at least 2.3 seconds in order to improve strength during functional transfers and community navigation. Baseline: see objective Goal status: IN PROGRESS    PLAN:  PT FREQUENCY: 1x/week  PT DURATION: 6 weeks  PLANNED INTERVENTIONS: 97110-Therapeutic exercises, 97530- Therapeutic activity, 97112- Neuromuscular re-education, 97535- Self Care, [id number]- Manual therapy, [phone]- Gait training, Patient/Family education, Balance training, Stair training, Taping, Joint mobilization, DME instructions, Cryotherapy, and Moist heat  PLAN FOR NEXT SESSION:  SLS assessment,  encourage walking program and health benefits of more active lifestyle   1:32 PM, 06/12/24 Lora Chavers Small Jashira Cotugno MPT [facility] [place] [phone] Ph:[phone]

## 2024-06-21 ENCOUNTER — Encounter (HOSPITAL_COMMUNITY)

## 2024-06-24 ENCOUNTER — Ambulatory Visit: Admitting: Bariatrics

## 2024-06-24 ENCOUNTER — Encounter: Payer: Self-pay | Admitting: Bariatrics

## 2024-06-24 VITALS — BP 131/81 | HR 75 | Temp 98.6°F | Ht <= 58 in | Wt 196.0 lb

## 2024-06-24 DIAGNOSIS — E66813 Obesity, class 3: Secondary | ICD-10-CM

## 2024-06-24 DIAGNOSIS — R632 Polyphagia: Secondary | ICD-10-CM | POA: Diagnosis not present

## 2024-06-24 DIAGNOSIS — K219 Gastro-esophageal reflux disease without esophagitis: Secondary | ICD-10-CM

## 2024-06-24 DIAGNOSIS — Z6841 Body Mass Index (BMI) 40.0 and over, adult: Secondary | ICD-10-CM | POA: Diagnosis not present

## 2024-06-24 MED ORDER — CONTRAVE 8-90 MG PO TB12: ORAL_TABLET | ORAL | 0 refills | Status: DC

## 2024-06-24 NOTE — Progress Notes (Signed)
 WEIGHT SUMMARY AND BIOMETRICS  Weight Lost Since Last Visit: 0  Weight Gained Since Last Visit: 4lb   Vitals Temp: 98.6 F (37 C) BP: 131/81 Pulse Rate: 75 SpO2: 98 %   Anthropometric Measurements Height: 4' 9 (1.448 m) Weight: 196 lb (88.9 kg) BMI (Calculated): 42.4 Weight at Last Visit: 192lb Weight Lost Since Last Visit: 0 Weight Gained Since Last Visit: 4lb Starting Weight: 194lb Total Weight Loss (lbs): 0 lb (0 kg) Peak Weight: 230lb   Body Composition  Body Fat %: 48.8 % Fat Mass (lbs): 95.8 lbs Muscle Mass (lbs): 95.2 lbs Total Body Water (lbs): 74.4 lbs Visceral Fat Rating : 16   Other Clinical Data Fasting: no Labs: no Today's Visit #: 3 Starting Date: 05/16/24    OBESITY Kenzlee is here to discuss her progress with her obesity treatment plan along with follow-up of her obesity related diagnoses.    Nutrition Plan: the Category 1 plan - 20% adherence.  Current exercise: none  Interim History:  She is up 4 lbs since her last visit. She has had some dental work and not able to eat as well. She is up 4 lbs of water per the bioimpedence scale.  Eating all of the food on the plan., Protein intake is less than prescribed., Journaling consistently., and Water intake is inadequate.  Hunger is moderately controlled.  Cravings are moderately controlled. She has more cravings at night.  Assessment/Plan:   Polyphagia Jenascia endorses excessive hunger.  Medication(s): none Effects of medication (appetite):  moderately controlled. Cravings are moderately controlled.  Denies absolute or relative contraindications.  Plan: Medication(s): Contrave  and taper as directed.  Rx: Contrave  8-90 mg taper as directed # 120 with no refills.  Will increase water, protein and fiber to help assuage hunger.  Will minimize foods that have a high glucose index/load  to minimize reactive hypoglycemia.  Discussed benefits, risks, and side effects.   Gastroesophageal reflux disease without esophagitis Symptoms is well-controlled.  Medication: Prilosec  (taking as needed)   Plan: Continue PPI. Use antacids such as Tums or Mylanta as needed. May use OTC Pepcid along with PPI if needed. Avoid trigger foods (spicy foods, fatty/fried foods, acidic foods, coffee, alcohol). Avoid eating within 3 hours of bedtime. Continue to work on weight loss.    Morbid Obesity: Current BMI BMI (Calculated): 42.4   Pharmacotherapy Plan Start  Contrave   Itha is currently in the action stage of change. As such, her goal is to continue with weight loss efforts.  She has agreed to the Category 1 plan.  Exercise goals: All adults should avoid inactivity. Some physical activity is better than none, and adults who participate in any amount of physical activity gain some health benefits.  Behavioral modification strategies: increasing lean protein intake, no meal skipping, meal planning , increase water intake, better snacking choices, planning for success, increasing vegetables, increasing  lower sugar fruits, and increasing fiber rich foods.  Sharicka has agreed to follow-up with our clinic in 2 weeks.     Objective:   VITALS: Per patient if applicable, see vitals. GENERAL: Alert and in no acute distress. CARDIOPULMONARY: No increased WOB. Speaking in clear sentences.  PSYCH: Pleasant and cooperative. Speech normal rate and rhythm. Affect is appropriate. Insight and judgement are appropriate. Attention is focused, linear, and appropriate.  NEURO: Oriented as arrived to appointment on time with no prompting.   Attestation Statements:   This was prepared with the assistance of Engineer, civil (consulting).  Occasional wrong-word or sound-a-like substitutions may have occurred due to the inherent limitations of voice recognition   Clayborne Daring, DO

## 2024-06-25 ENCOUNTER — Ambulatory Visit: Admitting: Bariatrics

## 2024-06-27 ENCOUNTER — Ambulatory Visit (HOSPITAL_COMMUNITY): Attending: Podiatry

## 2024-06-27 ENCOUNTER — Encounter (HOSPITAL_COMMUNITY): Payer: Self-pay

## 2024-06-27 DIAGNOSIS — M25562 Pain in left knee: Secondary | ICD-10-CM | POA: Diagnosis present

## 2024-06-27 DIAGNOSIS — Z7409 Other reduced mobility: Secondary | ICD-10-CM | POA: Diagnosis present

## 2024-06-27 DIAGNOSIS — M79671 Pain in right foot: Secondary | ICD-10-CM | POA: Diagnosis present

## 2024-06-27 DIAGNOSIS — M25561 Pain in right knee: Secondary | ICD-10-CM | POA: Diagnosis present

## 2024-06-27 NOTE — Therapy (Signed)
 OUTPATIENT PHYSICAL THERAPY LOWER EXTREMITY TREATMENT   Patient Name: [name] [name] MRN: [id number] DOB:01-30-1971, 53 y.o., female Today's Date: 06/27/2024  END OF SESSION:  PT End of Session - 06/27/24 1552     Visit Number 3    Number of Visits 6    Date for Recertification  07/17/24    Authorization Type [employer]    Authorization Time Period no auth    Progress Note Due on Visit 10    PT Start Time 1553    PT Stop Time 1635    PT Time Calculation (min) 42 min    Activity Tolerance Patient tolerated treatment well    Behavior During Therapy WFL for tasks assessed/performed           Past Medical History:  Diagnosis Date   Anxiety    Bursitis    Bursitis    Fibroid    GERD (gastroesophageal reflux disease)    Hypertension    seen by [facility]   Palpitations    PONV (postoperative nausea and vomiting)    Venous insufficiency    Vitamin D  deficiency    Past Surgical History:  Procedure Laterality Date   ABDOMINAL HYSTERECTOMY  01/16/2016 [facility])   partial for fibroids   CESAREAN SECTION     COLONOSCOPY WITH PROPOFOL  N/A 02/10/2021   Procedure: COLONOSCOPY WITH PROPOFOL ;  Surgeon: Golda Claudis PENNER, MD;  Location: [facility];  Service: Endoscopy;  Laterality: N/A;  11:00   DILATION AND CURETTAGE OF UTERUS     HYSTEROSCOPY WITH D & C  04/02/2012   Procedure: DILATATION AND CURETTAGE /HYSTEROSCOPY;  Surgeon: Dickie DELENA Carder, MD;  Location: [facility];  Service: Gynecology;  Laterality: N/A;   LAPAROSCOPIC GASTRIC SLEEVE RESECTION WITH HIATAL HERNIA REPAIR N/A 08/15/2016   Procedure: LAPAROSCOPIC GASTRIC SLEEVE RESECTION WITH HIATAL HERNIA REPAIR WITH UPPER ENDO;  Surgeon: Donnice Lunger, MD;  Location: [facility];  Service: General;  Laterality: N/A;   LAPAROSCOPIC TUBAL LIGATION  04/02/2012   Procedure: LAPAROSCOPIC TUBAL LIGATION;  Surgeon: Dickie DELENA Carder, MD;  Location: [facility];  Service: Gynecology;  Laterality: Bilateral;    TRANSTHORACIC ECHOCARDIOGRAM  01/11/2009   EF >55%, normal   Patient Active Problem List   Diagnosis Date Noted   Dry eyes 05/24/2024   Menopausal and female climacteric states 05/24/2024   Ankle pain, right 04/10/2024   Hyperpigmented skin lesion 10/17/2023   Multinodular goiter 12/30/2016   Abnormal TSH 11/24/2016   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 2017 08/15/2016   Allergic sinusitis 03/14/2016   Intermittent palpitations 12/12/2015   Paroxysmal atrial tachycardia 12/12/2015   Insomnia 11/23/2015   Knee pain, left 12/16/2014   Morbid obesity (HCC) 11/20/2012   Vitamin D  deficiency 11/20/2012   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   ANXIETY DISORDER, GENERALIZED 07/13/2007   Allergic rhinitis 07/13/2007   GERD 07/13/2007    PCP: Antonetta Rollene BRAVO, MD   REFERRING PROVIDER: Margrette Taft BRAVO, MD  REFERRING DIAG: 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  EVAL:Pt states she has been dealing with random bouts of bilateral knee pain, sharp anterior knee pain. Pt states thankfully she has not fallen but has had some close calls. Pt states she has been wearing a right ankle brace since the end of July. Pt reports the tendon has stretched overtime due to flat feet. Pt states she will only be able to come one time per week due to son breaking his femur. Reports sedentary work life but has tried to improve this.  PERTINENT HISTORY:  None  reported  PAIN:  Are you having pain? Yes: NPRS scale: 5-6/10 Pain location: anterior knee, medial arch of right foot Pain description: sharp for knee, soreness/hurting for the foot, lasts Aggravating factors: standing for a prolonged period of time Relieving factors: pain meds, elevation  PRECAUTIONS: None  RED FLAGS: None   WEIGHT BEARING RESTRICTIONS: No  FALLS:  Has patient fallen in last 6 months? No  OCCUPATION: magistrate  PLOF: Independent and Independent with basic ADLs  PATIENT GOALS: Pt states to decreased bilateral knee pain, decreased right foot pain.  NEXT MD VISIT: after therapy  OBJECTIVE:  Note: Objective measures were completed at Evaluation unless otherwise noted.  DIAGNOSTIC FINDINGS: CLINICAL DATA:  Left knee pain for over a year.   EXAM: MRI OF THE LEFT KNEE WITHOUT CONTRAST   TECHNIQUE: Multiplanar, multisequence MR imaging of the knee was performed. No intravenous contrast was administered.   COMPARISON:  None.   FINDINGS: MENISCI   Medial meniscus: Degenerative signal in the posterior horn with mild free edge fraying. No discrete meniscal tear or displaced meniscal fragment. The meniscal root appears intact.   Lateral meniscus:  Intact with normal morphology.   LIGAMENTS   Cruciates:  Intact.   Collaterals:  Intact.   CARTILAGE   Patellofemoral:  Preserved.   Medial: Mild chondral thinning and surface irregularity without full-thickness chondral defect or subchondral signal abnormality.   Lateral:  Preserved.   MISCELLANEOUS   Joint:  No significant joint effusion.   Popliteal Fossa: The popliteus muscle and tendon are intact. Small septated Baker's cyst.   Extensor Mechanism:  Intact.   Bones:  No acute or significant extra-articular osseous findings.   Other: Mild edema superolaterally in Hoffa's fat.   IMPRESSION: 1. Mild medial compartment degenerative changes. No acute osseous findings. 2. Mild degeneration  of the posterior horn of the medial meniscus with free edge fraying. No discrete meniscal tear or displaced meniscal fragment. 3. The lateral meniscus, cruciate and collateral ligaments are intact. 4. Small septated Baker's cyst from  PATIENT SURVEYS:  LEFS: 67 / 80 = 83.8 %   SENSATION: WFL  EDEMA:  Pt reports no swelling of knees today   PALPATION: Slight tenderness reported on medial joint line of R knee and bony prominence on medal R foot.  LOWER EXTREMITY ROM:  Active ROM Right 06/12/24 Left 06/12/24  Hip flexion    Hip extension    Hip abduction    Hip adduction    Hip internal rotation    Hip external rotation    Knee flexion 115  112  Knee extension 0 0  Ankle dorsiflexion    Ankle plantarflexion    Ankle inversion    Ankle eversion     (Blank rows = not tested)  LOWER EXTREMITY MMT:  MMT Right eval Left eval  Hip flexion    Hip extension    Hip abduction    Hip adduction    Hip internal rotation  Hip external rotation    Knee flexion 3- 3+  Knee extension 3 3  Ankle dorsiflexion    Ankle plantarflexion    Ankle inversion    Ankle eversion     (Blank rows = not tested)  LOWER EXTREMITY SPECIAL TESTS:  None reported  FUNCTIONAL TESTS:  5 times sit to stand: 17.76 s 2 minute walk test: TBA 281 ft  GAIT: Distance walked: 80 feet to and from treatment area Assistive device utilized: None Level of assistance: Complete Independence Comments: pt demonstrates decreased gait speed and qaulity, slight antalgic gait noted with decreased stride length and velocity noted.                                                                                                                                TREATMENT DATE:  06/27/24: Bike seat 4 L3 resistance Discussed walking program benefits SLS Lt 22, Rt 38  Sidelying: Abduction 10x Clam with cueing to reduce rotation, added RTB 10x5  Supine: Bridge with RTB around thigh  Discussed proper  shoe filling Standing: Heel raise 15 Toe raises 15x  06/12/2024 2 MWT 481 ft  Goal review SLS AROM of knees see above Right leg appears longer than the left leg in supine today Supine: Bridge x 10 SLR x 10 each Sidelying : Hip abduction x 10 each Clam x 10 each Sitting  LAQ's x 10 each Leg length measurement ASIS to medial malleoli Right 73 1/2 cm; left 75 and 1/4 cm    06/04/2024  Evaluation: -ROM measured, Strength assessed, HEP prescribed, pt educated on prognosis, findings, and importance of HEP compliance if given.     PATIENT EDUCATION:  Education details: Pt was educated on findings of PT evaluation, prognosis, frequency of therapy visits and rationale, attendance policy, and HEP if given.   Person educated: Patient Education method: Explanation, Verbal cues, and Handouts Education comprehension: verbalized understanding, verbal cues required, and needs further education  HOME EXERCISE PROGRAM: Access Code: [email or web address] URL: [email or web address] Date: 06/05/2024 Prepared by: Lang Ada  Exercises - Supine Bridge  - 1 x daily - 7 x weekly - 3 sets - 10 reps - Straight Leg Raise  - 1 x daily - 7 x weekly - 3 sets - 10 reps - Sidelying Hip Abduction  - 1 x daily - 7 x weekly - 3 sets - 10 reps - Standing Heel Raise  - 1 x daily - 7 x weekly - 3 sets - 10 reps - Heel Toe Raises with Counter Support  - 1 x daily - 7 x weekly - 3 sets - 10 reps - Seated Long Arc Quad  - 1 x daily - 7 x weekly - 3 sets - 10 reps  06/12/24 CLam 06/27/24:  Given RTB to add to clam exercise ASSESSMENT:  CLINICAL IMPRESSION: Discussed benefits of walking program for activity tolerance.  Began session on Bike for dynamic warm and knee/ankle mobility.  Discussed  appropriate shoes for foot alignment, discussed different types of shoes and orthotics may be beneficial.  Assessed SLS with ability to complete greater than 20 BLE and no reports of pain.  Reviewed clam per pt  request to assure correct form and mechanics with min cueing to reduce rotation, able to add theraband resistance for strengthening that was tolerated well.  Added sidestep for gluteal strengthening with theraband resistance.  Pt with plans to be out of town next week, will return on 07/08/24.  No reports of pain through session.    Eval: Patient is a 53 y.o. 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   Patient demonstrates increased pain in bilateral knees, right foot, decreased LE strength, abnormal gait pattern, and impaired functional mobility. Patient also demonstrates difficulty with ambulation during today's session with antalgic gait, decreased stride length, and decreased velocity noted. Patient also demonstrates signs and symptoms of sedentary work life which likely has contributed to pts current condition. Patient educated on the likely benefits that will be observed with initiation of consistent physical activity and HEP. Patient would benefit from skilled physical therapy for decreased knee and right foot pain, increased endurance with ambulation, increased LE strength, and balance for improved gait quality, return to higher level of function with ADLs, and progress towards therapy goals.   OBJECTIVE IMPAIRMENTS: Abnormal gait, decreased activity tolerance, decreased balance, decreased endurance, decreased knowledge of use of DME, decreased mobility, difficulty walking, decreased ROM, decreased strength, and pain.   ACTIVITY LIMITATIONS: carrying, lifting, bending, sitting, standing, squatting, stairs, transfers, and bed mobility  PARTICIPATION LIMITATIONS: meal prep, cleaning, laundry, driving, shopping, community activity, and yard work  PERSONAL FACTORS: Age, Fitness,  Past/current experiences, and Time since onset of injury/illness/exacerbation are also affecting patient's functional outcome.   REHAB POTENTIAL: Fair sedentary lifestyle  CLINICAL DECISION MAKING: Stable/uncomplicated  EVALUATION COMPLEXITY: Low   GOALS: Goals reviewed with patient? No  SHORT TERM GOALS: Target date: 06/26/24  Patient will demonstrate evidence of independence with individualized HEP and will report compliance for at least 3 days per week for optimized progression towards remaining therapy goals. Baseline:  Goal status: IN PROGRESS  2.  Patient will report a decrease in pain level during community ambulation by at least 2 points for improved quality of life. Baseline: 5-6/10 Goal status: IN PROGRESS     LONG TERM GOALS: Target date: 07/17/24  Pt will demonstrate a an increase of at least 9 points on the LEFS for improved performance of community ambulation and ADL. Baseline: see objective Goal status: IN PROGRESS  2.  Pt will improve 2 MWT by 40 feet in order to demonstrate improved functional ambulatory capacity in community setting.  Baseline: see objective Goal status: IN PROGRESS  3.  Pt will demonstrate WFL ROM (flexion and extension) in right knee, for increased mobility and maximal efficiency of gait cycle during ambulation. Baseline: see objective Goal status: IN PROGRESS  4.  Pt will demonstrate at least 4-/5 MMT for right lower extremity for increased strength during ADL and community ambulation. Baseline: see objective Goal status: IN PROGRESS  5.  Pt will improve 5TSTS by at least 2.3 seconds in order to improve strength during functional transfers and community navigation. Baseline: see objective Goal status: IN PROGRESS    PLAN:  PT FREQUENCY: 1x/week  PT DURATION: 6  weeks  PLANNED INTERVENTIONS: 97110-Therapeutic exercises, 97530- Therapeutic activity, 97112- Neuromuscular re-education, [phone]- Self Care, [id number]- Manual therapy,  [phone]- Gait training, Patient/Family education, Balance training, Stair training, Taping, Joint mobilization, DME instructions, Cryotherapy, and Moist heat  PLAN FOR NEXT SESSION:  SLS assessment,  encourage walking program and health benefits of more active lifestyle.  Continue knee and ankle mobility with proximal strengthening exercises.    Augustin Mclean, LPTA/CLT; CBIS [phone]  4:43 PM, 06/27/24

## 2024-07-08 ENCOUNTER — Encounter (HOSPITAL_COMMUNITY)

## 2024-07-15 ENCOUNTER — Ambulatory Visit (HOSPITAL_COMMUNITY)

## 2024-07-15 ENCOUNTER — Encounter (HOSPITAL_COMMUNITY): Payer: Self-pay

## 2024-07-15 DIAGNOSIS — M25561 Pain in right knee: Secondary | ICD-10-CM | POA: Diagnosis not present

## 2024-07-15 DIAGNOSIS — M79671 Pain in right foot: Secondary | ICD-10-CM

## 2024-07-15 DIAGNOSIS — Z7409 Other reduced mobility: Secondary | ICD-10-CM

## 2024-07-15 NOTE — Therapy (Signed)
 OUTPATIENT PHYSICAL THERAPY LOWER EXTREMITY TREATMENT/PROGRESS NOTE/DISCHARGE   Patient Name: [name] [name] MRN: [id number] DOB:Jan 13, 1971, 53 y.o., female Today's Date: 07/15/2024   PHYSICAL THERAPY DISCHARGE SUMMARY  Visits from Start of Care: 4  Current functional level related to goals / functional outcomes: See below   Remaining deficits: See below   Education / Equipment: HEP   Patient agrees to discharge. Patient goals were met. Patient is being discharged due to meeting the stated rehab goals. And being pleased with current functional level.    END OF SESSION:  PT End of Session - 07/15/24 1553     Visit Number 4    Number of Visits 6    Date for Recertification  --    Authorization Type [employer]    Authorization Time Period no auth    Progress Note Due on Visit 10    PT Start Time 1554   late arrival   PT Stop Time 1620    PT Time Calculation (min) 26 min    Activity Tolerance Patient tolerated treatment well    Behavior During Therapy WFL for tasks assessed/performed           Past Medical History:  Diagnosis Date   Anxiety    Bursitis    Bursitis    Fibroid    GERD (gastroesophageal reflux disease)    Hypertension    seen by [facility]   Palpitations    PONV (postoperative nausea and vomiting)    Venous insufficiency    Vitamin D  deficiency    Past Surgical History:  Procedure Laterality Date   ABDOMINAL HYSTERECTOMY  01/16/2016 [facility])   partial for fibroids   CESAREAN SECTION     COLONOSCOPY WITH PROPOFOL  N/A 02/10/2021   Procedure: COLONOSCOPY WITH PROPOFOL ;  Surgeon: Golda Claudis PENNER, MD;  Location: [facility];  Service: Endoscopy;  Laterality: N/A;  11:00   DILATION AND CURETTAGE OF UTERUS     HYSTEROSCOPY WITH D & C  04/02/2012   Procedure: DILATATION AND CURETTAGE /HYSTEROSCOPY;  Surgeon: Dickie DELENA Carder, MD;  Location: [facility];  Service: Gynecology;  Laterality: N/A;   LAPAROSCOPIC GASTRIC  SLEEVE RESECTION WITH HIATAL HERNIA REPAIR N/A 08/15/2016   Procedure: LAPAROSCOPIC GASTRIC SLEEVE RESECTION WITH HIATAL HERNIA REPAIR WITH UPPER ENDO;  Surgeon: Donnice Lunger, MD;  Location: [facility];  Service: General;  Laterality: N/A;   LAPAROSCOPIC TUBAL LIGATION  04/02/2012   Procedure: LAPAROSCOPIC TUBAL LIGATION;  Surgeon: Dickie DELENA Carder, MD;  Location: [facility];  Service: Gynecology;  Laterality: Bilateral;   TRANSTHORACIC ECHOCARDIOGRAM  01/11/2009   EF >55%, normal   Patient Active Problem List   Diagnosis Date Noted   Dry eyes 05/24/2024   Menopausal and female climacteric states 05/24/2024   Ankle pain, right 04/10/2024   Hyperpigmented skin lesion 10/17/2023   Multinodular goiter 12/30/2016   Abnormal TSH 11/24/2016   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 2017 08/15/2016   Allergic sinusitis 03/14/2016   Intermittent palpitations 12/12/2015   Paroxysmal atrial tachycardia 12/12/2015   Insomnia 11/23/2015   Knee pain, left 12/16/2014   Morbid obesity (HCC) 11/20/2012   Vitamin D  deficiency 11/20/2012   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   ANXIETY DISORDER, GENERALIZED 07/13/2007   Allergic rhinitis 07/13/2007   GERD 07/13/2007    PCP: Antonetta Rollene BRAVO, MD   REFERRING PROVIDER: Margrette Taft BRAVO, MD  REFERRING DIAG: 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 Reports she thinks its weather related. Reports HEP going well, no concerns.     EVAL:Pt states she has  been dealing with random bouts of bilateral knee pain, sharp anterior knee pain. Pt states thankfully she has not fallen but has had some close calls. Pt states she has been wearing a right ankle brace since the end of July. Pt reports the tendon has stretched overtime due to flat feet. Pt states she will only be able to come one time per week due to son breaking his femur. Reports sedentary work life but has tried to improve this.  PERTINENT HISTORY:  None reported  PAIN:  Are you having pain? Yes: NPRS scale: 5-6/10 Pain location: anterior knee, medial arch of right foot Pain description: sharp for knee, soreness/hurting for the foot, lasts Aggravating factors: standing for a prolonged period of time Relieving factors: pain meds, elevation  PRECAUTIONS: None  RED FLAGS: None   WEIGHT BEARING RESTRICTIONS: No  FALLS:  Has patient fallen in last 6 months? No  OCCUPATION: magistrate  PLOF: Independent and Independent with basic ADLs  PATIENT GOALS: Pt states to decreased bilateral knee pain, decreased right foot pain.  NEXT MD VISIT: after therapy  OBJECTIVE:  Note: Objective measures were completed at Evaluation unless otherwise noted.  DIAGNOSTIC FINDINGS: CLINICAL DATA:  Left knee pain for over a year.   EXAM: MRI OF THE LEFT KNEE WITHOUT CONTRAST   TECHNIQUE: Multiplanar, multisequence MR imaging of the knee was performed. No intravenous contrast was administered.   COMPARISON:  None.   FINDINGS: MENISCI   Medial meniscus: Degenerative signal in the posterior horn with mild free edge fraying. No discrete meniscal tear or displaced meniscal fragment. The meniscal root appears intact.   Lateral meniscus:  Intact with normal morphology.   LIGAMENTS   Cruciates:  Intact.   Collaterals:  Intact.   CARTILAGE   Patellofemoral:  Preserved.   Medial: Mild chondral thinning and surface irregularity without full-thickness chondral defect or subchondral signal  abnormality.   Lateral:  Preserved.   MISCELLANEOUS   Joint:  No significant joint effusion.   Popliteal Fossa: The popliteus muscle and tendon are intact. Small septated Baker's cyst.   Extensor Mechanism:  Intact.   Bones:  No acute or significant extra-articular osseous findings.   Other: Mild edema superolaterally in Hoffa's fat.   IMPRESSION: 1. Mild medial compartment degenerative changes. No acute osseous findings. 2. Mild degeneration of the posterior horn of the medial meniscus with free edge fraying. No discrete meniscal tear or displaced meniscal fragment. 3. The lateral meniscus, cruciate and collateral ligaments are intact. 4. Small septated Baker's cyst from  PATIENT SURVEYS:  LEFS: 67 / 80 = 83.8 %   06/25/24: Lower Extremity Functional Score: 74 / 80 = 92.5 %   SENSATION: WFL  EDEMA:  Pt reports no swelling of knees today   PALPATION: Slight tenderness reported on medial joint line of R knee and bony prominence on medal R foot.  LOWER EXTREMITY ROM:  Active ROM Right 06/12/24 Left 06/12/24 R 07/15/24 L 07/15/24  Hip flexion      Hip extension  Hip abduction      Hip adduction      Hip internal rotation      Hip external rotation      Knee flexion 115  112 120 119  Knee extension 0 0 0 0  Ankle dorsiflexion      Ankle plantarflexion      Ankle inversion      Ankle eversion       (Blank rows = not tested)  LOWER EXTREMITY MMT:  MMT Right eval Left eval R 07/15/24 L 07/15/24  Hip flexion      Hip extension      Hip abduction      Hip adduction      Hip internal rotation      Hip external rotation      Knee flexion 3- 3+ 4+ 4+  Knee extension 3 3 4 4   Ankle dorsiflexion      Ankle plantarflexion      Ankle inversion      Ankle eversion       (Blank rows = not tested)  LOWER EXTREMITY SPECIAL TESTS:  None reported  FUNCTIONAL TESTS:  5 times sit to stand: 17.76 s 2 minute walk test: TBA 281 ft  07/15/24: 5  times times sit to stand: 12 seconds 2 minute walk test: 484 ft, no inc pain  GAIT: Distance walked: 80 feet to and from treatment area Assistive device utilized: None Level of assistance: Complete Independence Comments: pt demonstrates decreased gait speed and qaulity, slight antalgic gait noted with decreased stride length and velocity noted.                                                                                                                                TREATMENT DATE:  07/15/24: Bike, seat 4, L3 resistance for dynamic warm up LEFS LE ROM LE MMT 5 times sit to stand 2 minute walk test Review of goals and HEP   06/27/24: Bike seat 4 L3 resistance Discussed walking program benefits SLS Lt 22, Rt 38  Sidelying: Abduction 10x Clam with cueing to reduce rotation, added RTB 10x5  Supine: Bridge with RTB around thigh  Discussed proper shoe filling Standing: Heel raise 15 Toe raises 15x  06/12/2024 2 MWT 481 ft  Goal review SLS AROM of knees see above Right leg appears longer than the left leg in supine today Supine: Bridge x 10 SLR x 10 each Sidelying : Hip abduction x 10 each Clam x 10 each Sitting  LAQ's x 10 each Leg length measurement ASIS to medial malleoli Right 73 1/2 cm; left 75 and 1/4 cm    06/04/2024  Evaluation: -ROM measured, Strength assessed, HEP prescribed, pt educated on prognosis, findings, and importance of HEP compliance if given.     PATIENT EDUCATION:  Education details: Pt was educated on findings of PT evaluation, prognosis, frequency of therapy visits and rationale, attendance policy, and HEP if given.  Person educated: Patient Education method: Explanation, Verbal cues, and Handouts Education comprehension: verbalized understanding, verbal cues required, and needs further education  HOME EXERCISE PROGRAM: Access Code: [email or web address] URL: [email or web address] Date: 06/05/2024 Prepared by: Lang Ada  Exercises - Supine Bridge  - 1 x daily - 7 x weekly - 3 sets - 10 reps - Straight Leg Raise  - 1 x daily - 7 x weekly - 3 sets - 10 reps - Sidelying Hip Abduction  - 1 x daily - 7 x weekly - 3 sets - 10 reps - Standing Heel Raise  - 1 x daily - 7 x weekly - 3 sets - 10 reps - Heel Toe Raises with Counter Support  - 1 x daily - 7 x weekly - 3 sets - 10 reps - Seated Long Arc Quad  - 1 x daily - 7 x weekly - 3 sets - 10 reps  06/12/24 CLam 06/27/24:  Given RTB to add to clam exercise ASSESSMENT:  CLINICAL IMPRESSION: Progress note performed this date. Patient demonstrates improvements with self perceived function via LEFS, improved LE ROM, improved LE strength via MMT and 5 times sit to stand, and improved endurance and gait speed via 2 minute walk test. Patient has met all objective goals besides one but reports she made good improvement with PT as far as function and pain reduction. Has no other concerns. Patient reports she feels ready for discharge to HEP to maintain progress made during therapy. Pt discharged to HEP.      Eval: Patient is a 53 y.o. 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  Patient demonstrates increased pain in bilateral knees, right foot, decreased LE strength, abnormal gait pattern, and impaired functional mobility. Patient also demonstrates difficulty with ambulation during today's session with antalgic gait, decreased stride length, and decreased velocity noted. Patient also demonstrates signs and symptoms of sedentary work life which likely has contributed to pts current condition. Patient educated on the likely benefits that will be observed with initiation of consistent physical activity and HEP. Patient would benefit from skilled physical  therapy for decreased knee and right foot pain, increased endurance with ambulation, increased LE strength, and balance for improved gait quality, return to higher level of function with ADLs, and progress towards therapy goals.   OBJECTIVE IMPAIRMENTS: Abnormal gait, decreased activity tolerance, decreased balance, decreased endurance, decreased knowledge of use of DME, decreased mobility, difficulty walking, decreased ROM, decreased strength, and pain.   ACTIVITY LIMITATIONS: carrying, lifting, bending, sitting, standing, squatting, stairs, transfers, and bed mobility  PARTICIPATION LIMITATIONS: meal prep, cleaning, laundry, driving, shopping, community activity, and yard work  PERSONAL FACTORS: Age, Fitness, Past/current experiences, and Time since onset of injury/illness/exacerbation are also affecting patient's functional outcome.   REHAB POTENTIAL: Fair sedentary lifestyle  CLINICAL DECISION MAKING: Stable/uncomplicated  EVALUATION COMPLEXITY: Low   GOALS: Goals reviewed with patient? No  SHORT TERM GOALS: Target date: 06/26/24  Patient will demonstrate evidence of independence with individualized HEP and will report compliance for at least 3 days per week for optimized progression towards remaining therapy goals. Baseline: Reports 2-3 days/wk for specific HEP but active every day Goal status: MET  2.  Patient will report a decrease in pain level during community ambulation by at least 2 points for improved quality of life. Baseline: reports 3/10 on 07/15/24  Goal status: MET     LONG TERM GOALS: Target date: 07/17/24  Pt will demonstrate a an increase of at least 9 points on the LEFS for improved performance of community ambulation and ADL. Baseline: see objective Goal status: NOT MET  2.  Pt will improve 2 MWT by 40 feet in order to demonstrate improved functional ambulatory capacity in community setting.  Baseline: inc by 200 ft on 07/15/24 Goal status: MET  3.  Pt  will demonstrate WFL ROM (flexion and extension) in right knee, for increased mobility and maximal efficiency of gait cycle during ambulation. Baseline: see objective Goal status: MET  4.  Pt will demonstrate at least 4-/5 MMT for right lower extremity for increased strength during ADL and community ambulation. Baseline: see objective Goal status: MET  5.  Pt will improve 5TSTS by at least 2.3 seconds in order to improve strength during functional transfers and community navigation. Baseline: see objective Goal status: MET    PLAN:  PT FREQUENCY: 1x/week  PT DURATION: 6 weeks  PLANNED INTERVENTIONS: 97110-Therapeutic exercises, 97530- Therapeutic activity, 97112- Neuromuscular re-education, 97535- Self Care, [id number]- Manual therapy, [phone]- Gait training, Patient/Family education, Balance training, Stair training, Taping, Joint mobilization, DME instructions, Cryotherapy, and Moist heat  PLAN FOR NEXT SESSION:  Pt discharged   4:31 PM, 07/15/24 Tinzley Dalia Powell-Butler, PT, DPT [facility]

## 2024-07-16 ENCOUNTER — Ambulatory Visit: Admitting: Bariatrics

## 2024-07-22 ENCOUNTER — Encounter (HOSPITAL_COMMUNITY)

## 2024-07-23 ENCOUNTER — Ambulatory Visit: Admitting: Bariatrics

## 2024-07-23 ENCOUNTER — Encounter: Payer: Self-pay | Admitting: Bariatrics

## 2024-07-23 VITALS — BP 146/88 | Ht <= 58 in | Wt 190.0 lb

## 2024-07-23 DIAGNOSIS — E66813 Obesity, class 3: Secondary | ICD-10-CM

## 2024-07-23 DIAGNOSIS — K5900 Constipation, unspecified: Secondary | ICD-10-CM | POA: Diagnosis not present

## 2024-07-23 DIAGNOSIS — K5909 Other constipation: Secondary | ICD-10-CM

## 2024-07-23 DIAGNOSIS — R632 Polyphagia: Secondary | ICD-10-CM | POA: Diagnosis not present

## 2024-07-23 DIAGNOSIS — Z6841 Body Mass Index (BMI) 40.0 and over, adult: Secondary | ICD-10-CM | POA: Diagnosis not present

## 2024-07-23 NOTE — Progress Notes (Signed)
 WEIGHT SUMMARY AND BIOMETRICS  Weight Lost Since Last Visit: 6lb  Weight Gained Since Last Visit: 0   Vitals BP: (!) 146/88   Anthropometric Measurements Height: 4' 9 (1.448 m) Weight: 190 lb (86.2 kg) BMI (Calculated): 41.1 Weight at Last Visit: 196lb Weight Lost Since Last Visit: 6lb Weight Gained Since Last Visit: 0 Starting Weight: 194lb Total Weight Loss (lbs): 4 lb (1.814 kg) Peak Weight: 230lb   Body Composition  Body Fat %: 47.5 % Fat Mass (lbs): 90.2 lbs Muscle Mass (lbs): 94.8 lbs Total Body Water (lbs): 72.2 lbs Visceral Fat Rating : 15   Other Clinical Data Fasting: no Labs: no Today's Visit #: 4 Starting Date: 05/16/24    OBESITY Yalanda is here to discuss her progress with her obesity treatment plan along with follow-up of her obesity related diagnoses.    Nutrition Plan: the Category 1 plan - 70% adherence.  Current exercise: line dancing  Interim History:  She is down 6 lbs since her last visit.  Eating all of the food on the plan., Protein intake is as prescribed, Is not drinking sugar sweetened beverages., Journaling consistently., and Water intake is inadequate.   Pharmacotherapy: Jaynie is on Contrave  1 in the am, and 1 in the pm.  Adverse side effects: Constipation,had N/V at first but dissipated.  Hunger is moderately controlled.  Cravings are moderately controlled.  Assessment/Plan:   Polyphagia Milley endorses excessive hunger.  Medication(s):Contrave  Effects of medication:  moderately controlled. Cravings are moderately controlled.   Plan: Medication(s): dContrave 1 in the am and 1 in the pm.  Will increase water, protein and fiber to help assuage hunger.  Will minimize foods that have a high glucose index/load to minimize reactive hypoglycemia.   Constipation Alegria notes constipation.   This is likely related to  Contrave  and dietary changes.  She has been taking no medications.  Constipation is poorly controlled.   Plan: Increase fiber up to 25 to 30 grams of fiber (fiber sheet given).  Increase water intake to at least 64 ounces daily.  Will consider MiraLAX once daily.  May take twice a day or every other day based on results. Add Metamucil or Citrucel daily if needed.  Add magnesium  citrate if needed.     Morbid Obesity: Current BMI BMI (Calculated): 41.1   Pharmacotherapy Plan Continue  Contrave  at 1 in the am and 1 in the pm.   Aloria is currently in the action stage of change. As such, her goal is to continue with weight loss efforts.  She has agreed to the Category 1 plan.  Exercise goals: All adults should avoid inactivity. Some physical activity is better than none, and adults who participate in any amount of physical activity gain some health benefits.  Behavioral modification strategies: increasing lean protein intake, decreasing simple carbohydrates , no meal skipping, meal planning , increase water  intake, better snacking choices, planning for success, increasing vegetables, avoiding temptations, keep healthy foods in the home, weigh protein portions, measure portion sizes, and mindful eating.  Milan has agreed to follow-up with our clinic in 3 weeks.      Objective:   VITALS: Per patient if applicable, see vitals. GENERAL: Alert and in no acute distress. CARDIOPULMONARY: No increased WOB. Speaking in clear sentences.  PSYCH: Pleasant and cooperative. Speech normal rate and rhythm. Affect is appropriate. Insight and judgement are appropriate. Attention is focused, linear, and appropriate.  NEURO: Oriented as arrived to appointment on time with no prompting.   Attestation Statements:   This was prepared with the assistance of Engineer, Civil (consulting).  Occasional wrong-word or sound-a-like substitutions may have occurred due to the inherent limitations of voice recognition

## 2024-07-30 ENCOUNTER — Ambulatory Visit: Admitting: Family Medicine

## 2024-07-30 ENCOUNTER — Encounter: Payer: Self-pay | Admitting: Family Medicine

## 2024-07-30 ENCOUNTER — Other Ambulatory Visit (HOSPITAL_COMMUNITY): Payer: Self-pay | Admitting: Family Medicine

## 2024-07-30 VITALS — BP 137/80 | HR 74 | Resp 18 | Ht 59.0 in | Wt 190.0 lb

## 2024-07-30 DIAGNOSIS — K219 Gastro-esophageal reflux disease without esophagitis: Secondary | ICD-10-CM | POA: Diagnosis not present

## 2024-07-30 DIAGNOSIS — Z13220 Encounter for screening for lipoid disorders: Secondary | ICD-10-CM

## 2024-07-30 DIAGNOSIS — J309 Allergic rhinitis, unspecified: Secondary | ICD-10-CM

## 2024-07-30 DIAGNOSIS — E559 Vitamin D deficiency, unspecified: Secondary | ICD-10-CM | POA: Diagnosis not present

## 2024-07-30 DIAGNOSIS — F411 Generalized anxiety disorder: Secondary | ICD-10-CM | POA: Diagnosis not present

## 2024-07-30 DIAGNOSIS — Z1231 Encounter for screening mammogram for malignant neoplasm of breast: Secondary | ICD-10-CM

## 2024-07-30 DIAGNOSIS — E785 Hyperlipidemia, unspecified: Secondary | ICD-10-CM

## 2024-07-30 MED ORDER — NYSTATIN 100000 UNIT/GM EX POWD: Freq: Two times a day (BID) | CUTANEOUS | 3 refills | Status: AC

## 2024-07-30 MED ORDER — FLUTICASONE PROPIONATE 50 MCG/ACT NA SUSP: 2.0000 | Freq: Every day | NASAL | 5 refills | Status: AC

## 2024-07-30 MED ORDER — CLOTRIMAZOLE-BETAMETHASONE 1-0.05 % EX CREA: TOPICAL_CREAM | CUTANEOUS | 3 refills | Status: AC

## 2024-07-30 NOTE — Patient Instructions (Addendum)
 F/U in 5 months  Please schedule Feb mammogram at checkout  It is important that you exercise regularly at least 30 minutes 5 times a week. If you develop chest pain, have severe difficulty breathing, or feel very tired, stop exercising immediately and seek medical attention    Think about what you will eat, plan ahead. Choose  clean, green, fresh or frozen over canned, processed or packaged foods which are more sugary, salty and fatty. 70 to 75% of food eaten should be vegetables and fruit. Three meals at set times with snacks allowed between meals, but they must be fruit or vegetables. Aim to eat over a 12 hour period , example 7 am to 7 pm, and STOP after  your last meal of the day. Drink water,generally about 64 ounces per day, no other drink is as healthy. Fruit juice is best enjoyed in a healthy way, by EATING the fruit.  Fasting lipid, cmp and EGFR, vit D and CBC  1 week before next appointment  Thanks for choosing [facility], we consider it a privelige to serve you.

## 2024-08-04 ENCOUNTER — Encounter: Payer: Self-pay | Admitting: Family Medicine

## 2024-08-04 DIAGNOSIS — E785 Hyperlipidemia, unspecified: Secondary | ICD-10-CM | POA: Insufficient documentation

## 2024-08-04 NOTE — Assessment & Plan Note (Signed)
 Hyperlipidemia:Low fat diet discussed and encouraged.   Lipid Panel  Lab Results  Component Value Date   CHOL 219 (H) 05/10/2024   HDL 92 05/10/2024   LDLCALC 111 (H) 05/10/2024   TRIG 90 05/10/2024   CHOLHDL 2.4 05/10/2024     Updated lab needed at/ before next visit.

## 2024-08-04 NOTE — Assessment & Plan Note (Signed)
 Controlled, no change in medication

## 2024-08-04 NOTE — Assessment & Plan Note (Signed)
 Updated lab needed at/ before next visit.

## 2024-08-04 NOTE — Assessment & Plan Note (Signed)
  Patient re-educated about  the importance of commitment to a  minimum of 150 minutes of exercise per week as able.  The importance of healthy food choices with portion control discussed, as well as eating regularly and within a 12 hour window most days. The need to choose clean , green food 50 to 75% of the time is discussed, as well as to make water the primary drink and set a goal of 64 ounces water daily.       07/30/2024    1:02 PM 07/23/2024    3:00 PM 06/24/2024    2:00 PM  Weight /BMI  Weight 190 lb 190 lb 196 lb  Height 4' 11 (1.499 m) 4' 9 (1.448 m) 4' 9 (1.448 m)  BMI 38.38 kg/m2 41.12 kg/m2 42.41 kg/m2    unchanged

## 2024-08-04 NOTE — Progress Notes (Signed)
   [name] [name]     MRN: [id number]      DOB: [birth year]  Chief Complaint  Patient presents with   Medical Management of Chronic Issues    Follow up     HPI [name] [name] is here for follow up and re-evaluation of chronic medical conditions, medication management and review of any available recent lab and radiology data.  Preventive health is updated, specifically  Cancer screening and Immunization.   Has started with weight management clinic and is doing well The PT denies any adverse reactions to current medications since the last visit.  There are no new concerns.  There are no specific complaints   ROS Denies recent fever or chills. Denies sinus pressure, nasal congestion, ear pain or sore throat. Denies chest congestion, productive cough or wheezing. Denies chest pains, palpitations and leg swelling Denies abdominal pain, nausea, vomiting,diarrhea or constipation.   Denies dysuria, frequency, hesitancy or incontinence. Denies joint pain, swelling and limitation in mobility. Denies headaches, seizures, numbness, or tingling. Denies depression, anxiety or insomnia. Denies skin break down or rash.   PE  BP 137/80   Pulse 74   Resp 18   Ht 4' 11 (1.499 m)   Wt 190 lb (86.2 kg)   LMP 11/17/2015   SpO2 98%   BMI 38.38 kg/m   Patient alert and oriented and in no cardiopulmonary distress.  HEENT: No facial asymmetry, EOMI,     Neck supple .  Chest: Clear to auscultation bilaterally.  CVS: S1, S2 no murmurs, no S3.Regular rate.  ABD: Soft non tender.   Ext: No edema  MS: Adequate ROM spine, shoulders, hips and knees.  Skin: Intact, no ulcerations or rash noted.  Psych: Good eye contact, normal affect. Memory intact not anxious or depressed appearing.  CNS: CN 2-12 intact, power,  normal throughout.no focal deficits noted.   Assessment & Plan  ANXIETY DISORDER, GENERALIZED Controlled, no change in medication   Morbid obesity (HCC)  Patient re-educated about   the importance of commitment to a  minimum of 150 minutes of exercise per week as able.  The importance of healthy food choices with portion control discussed, as well as eating regularly and within a 12 hour window most days. The need to choose clean , green food 50 to 75% of the time is discussed, as well as to make water the primary drink and set a goal of 64 ounces water daily.       07/30/2024    1:02 PM 07/23/2024    3:00 PM 06/24/2024    2:00 PM  Weight /BMI  Weight 190 lb 190 lb 196 lb  Height 4' 11 (1.499 m) 4' 9 (1.448 m) 4' 9 (1.448 m)  BMI 38.38 kg/m2 41.12 kg/m2 42.41 kg/m2    unchanged  Vitamin D  deficiency Updated lab needed at/ before next visit.   Dyslipidemia Hyperlipidemia:Low fat diet discussed and encouraged.   Lipid Panel  Lab Results  Component Value Date   CHOL 219 (H) 05/10/2024   HDL 92 05/10/2024   LDLCALC 111 (H) 05/10/2024   TRIG 90 05/10/2024   CHOLHDL 2.4 05/10/2024     Updated lab needed at/ before next visit.   Allergic rhinitis Controlled, no change in medication

## 2024-08-05 ENCOUNTER — Ambulatory Visit: Admitting: "Endocrinology

## 2024-08-05 ENCOUNTER — Encounter: Payer: Self-pay | Admitting: "Endocrinology

## 2024-08-05 VITALS — BP 134/82 | HR 68 | Ht 59.0 in | Wt 190.0 lb

## 2024-08-05 DIAGNOSIS — E042 Nontoxic multinodular goiter: Secondary | ICD-10-CM | POA: Diagnosis not present

## 2024-08-05 NOTE — Progress Notes (Signed)
 Endocrinology Consult Note                                            08/05/2024, 12:40 PM   Subjective:    Patient ID: [name] [name], female    DOB: 11/08/1970, PCP Antonetta Rollene BRAVO, MD   Past Medical History:  Diagnosis Date   Anxiety    Bursitis    Bursitis    Fibroid    GERD (gastroesophageal reflux disease)    Hypertension    seen by [facility]   Palpitations    PONV (postoperative nausea and vomiting)    Venous insufficiency    Vitamin D  deficiency    Past Surgical History:  Procedure Laterality Date   ABDOMINAL HYSTERECTOMY  01/16/2016 [facility])   partial for fibroids   CESAREAN SECTION     COLONOSCOPY WITH PROPOFOL  N/A 02/10/2021   Procedure: COLONOSCOPY WITH PROPOFOL ;  Surgeon: Golda Claudis PENNER, MD;  Location: [facility];  Service: Endoscopy;  Laterality: N/A;  11:00   DILATION AND CURETTAGE OF UTERUS     HYSTEROSCOPY WITH D & C  04/02/2012   Procedure: DILATATION AND CURETTAGE /HYSTEROSCOPY;  Surgeon: Dickie DELENA Carder, MD;  Location: [facility];  Service: Gynecology;  Laterality: N/A;   LAPAROSCOPIC GASTRIC SLEEVE RESECTION WITH HIATAL HERNIA REPAIR N/A 08/15/2016   Procedure: LAPAROSCOPIC GASTRIC SLEEVE RESECTION WITH HIATAL HERNIA REPAIR WITH UPPER ENDO;  Surgeon: Donnice Lunger, MD;  Location: [facility];  Service: General;  Laterality: N/A;   LAPAROSCOPIC TUBAL LIGATION  04/02/2012   Procedure: LAPAROSCOPIC TUBAL LIGATION;  Surgeon: Dickie DELENA Carder, MD;  Location: [facility];  Service: Gynecology;  Laterality: Bilateral;   TRANSTHORACIC ECHOCARDIOGRAM  01/11/2009   EF >55%, normal   Social History   Socioeconomic History   Marital status: Married    Spouse name: Not on file   Number of children: Not on file   Years of education: Not on file   Highest education level: Not on file  Occupational History   Not on file  Tobacco Use   Smoking status: Never   Smokeless tobacco: Never  Vaping Use   Vaping status: Never Used   Substance and Sexual Activity   Alcohol use: Yes    Comment: social   Drug use: No   Sexual activity: Yes    Partners: Male    Birth control/protection: Surgical    Comment: hysterectomy  Other Topics Concern   Not on file  Social History Narrative   Not on file   Social Drivers of Health   Financial Resource Strain: Not on file  Food Insecurity: Not on file  Transportation Needs: Not on file  Physical Activity: Not on file  Stress: Not on file  Social Connections: Not on file   Family History  Problem Relation Age of Onset   Hypertension Mother    Obesity Mother    Diabetes Mother    Cancer Father 40        prostate   Diabetes Father    Hypertension Father    Heart disease Father    Hyperlipidemia Father    Hypertension Sister    Diabetes Sister    Hypertension Sister    Diabetes Sister    Cancer Paternal Aunt 46       breast cancer   Breast cancer Paternal Aunt 63  Outpatient Encounter Medications as of 08/05/2024  Medication Sig   acetaminophen  (TYLENOL ) 500 MG tablet Take 1,000 mg by mouth every 6 (six) hours as needed for moderate pain or headache.   ALPRAZolam  (XANAX ) 1 MG tablet Take 1 tablet (1 mg total) by mouth at bedtime.   cetirizine  (ZYRTEC  ALLERGY) 10 MG tablet Take 1 tablet (10 mg total) by mouth daily.   clotrimazole -betamethasone  (LOTRISONE ) cream Apply twice daily to affected area for 1 week, then as needed   fluticasone  (FLONASE ) 50 MCG/ACT nasal spray Place 2 sprays into both nostrils daily.   hydrOXYzine  (ATARAX ) 10 MG tablet Take one tablet by mouth three times daily   Naltrexone -buPROPion  HCl ER (CONTRAVE ) 8-90 MG TB12 Start 1 tablet every morning for 7 days, then 1 tablet twice daily for 7 days, then 2 tablets every morning and one every evening   NYSTATIN  powder Apply topically 2 (two) times daily.   omeprazole  (PRILOSEC ) 40 MG capsule TAKE 1 CAPSULE(40 MG) BY MOUTH DAILY   Propylene Glycol (SYSTANE BALANCE) 0.6 % SOLN Place 1 drop into  both eyes daily as needed (dry eyes).   RESTASIS  0.05 % ophthalmic emulsion Place 1 drop into both eyes 2 (two) times daily.   Vitamin D , Ergocalciferol , (DRISDOL ) 1.25 MG (50000 UNIT) CAPS capsule Take 1 capsule (50,000 Units total) by mouth every 7 (seven) days.   No facility-administered encounter medications on file as of 08/05/2024.   ALLERGIES: Allergies  Allergen Reactions   Diltiazem Rash   Phentermine  Palpitations    H/o non sustained v tach    VACCINATION STATUS: Immunization History  Administered Date(s) Administered   Influenza Split 07/05/2012   Moderna SARS-COV2 Booster Vaccination 12/30/2020   Moderna Sars-Covid-2 Vaccination 12/05/2019   PFIZER(Purple Top)SARS-COV-2 Vaccination 01/13/2020   Tdap 07/05/2011, 12/29/2021   Zoster Recombinant(Shingrix) 03/26/2020, 08/17/2020    HPI [name] HEIRESS [name] is 53 y.o. female who presents today with a medical history as above. she is being seen in consultation for goiter, suppressed TSH requested by Antonetta Rollene BRAVO, MD.  History is obtained directly from the patient and chart review.  This patient was seen in April 2018 for same concern.  She did have subcentimeter nodules on bilateral thyroid  lobes which did not require any biopsy or follow-up. She is known to have suppressed TSH for multiple years. She was supposed to return with thyroid  function test however did not show up for follow-ups.  She is being referred again for same concern.  Her previsit thyroid  ultrasound showed slight increase in the size of bilateral thyroid  lobes, however bilateral subcentimeter thyroid  nodules remains similar and stable. Her most recent TSH was 0.44.  She is not on any antithyroid intervention at this time.  She denies dysphagia, shortness, nor voice change. Her other medical problems include prediabetes, vitamin D  deficiency, obesity on Contrave . She denies palpitations, tremors, nor heat/cold intolerance.    Review of  Systems  Constitutional: +mildly fluctuating body weight,  no fatigue, no subjective hyperthermia, no subjective hypothermia Eyes: no blurry vision, no xerophthalmia ENT: no sore throat, no nodules palpated in throat, no dysphagia/odynophagia, no hoarseness Cardiovascular: no Chest Pain, no Shortness of Breath, no palpitations, no leg swelling Respiratory: no cough, no shortness of breath Gastrointestinal: no Nausea/Vomiting/Diarhhea Musculoskeletal: no muscle/joint aches Skin: no rashes Neurological: no tremors, no numbness, no tingling, no dizziness Psychiatric: no depression, no anxiety  Objective:       08/05/2024   10:40 AM 07/30/2024    1:02 PM 07/23/2024  3:08 PM  Vitals with BMI  Height 4' 11 4' 11   Weight 190 lbs 190 lbs   BMI 38.35 38.35   Systolic 134 137 853  Diastolic 82 80 88  Pulse 68 74     BP 134/82   Pulse 68   Ht 4' 11 (1.499 m)   Wt 190 lb (86.2 kg)   LMP 11/17/2015   BMI 38.38 kg/m   Wt Readings from Last 3 Encounters:  08/05/24 190 lb (86.2 kg)  07/30/24 190 lb (86.2 kg)  07/23/24 190 lb (86.2 kg)    Physical Exam  Constitutional:  Body mass index is 38.38 kg/m.,  not in acute distress, normal state of mind Eyes: PERRLA, EOMI, no exophthalmos ENT: moist mucous membranes, no gross thyromegaly, no gross cervical lymphadenopathy Cardiovascular: normal precordial activity, Regular Rate and Rhythm, no Murmur/Rubs/Gallops Respiratory:  adequate breathing efforts, no gross chest deformity, Clear to auscultation bilaterally Gastrointestinal: abdomen soft, Non -tender, No distension, Bowel Sounds present, no gross organomegaly Musculoskeletal: no gross deformities, strength intact in all four extremities, no peripheral edema Skin: moist, warm, no rashes Neurological: no tremor with outstretched hands, Deep tendon reflexes normal in bilateral lower extremities.  CMP ( most recent) CMP     Component Value Date/Time   NA 138 05/10/2024 1125    K 4.2 05/10/2024 1125   CL 102 05/10/2024 1125   CO2 22 05/10/2024 1125   GLUCOSE 73 05/10/2024 1125   GLUCOSE 80 07/15/2020 0924   BUN 16 05/10/2024 1125   CREATININE 0.76 05/10/2024 1125   CREATININE 0.79 07/15/2020 0924   CALCIUM 9.3 05/10/2024 1125   PROT 6.8 05/10/2024 1125   ALBUMIN 3.9 05/10/2024 1125   AST 15 05/10/2024 1125   ALT 9 05/10/2024 1125   ALKPHOS 89 05/10/2024 1125   BILITOT 0.3 05/10/2024 1125   EGFR 93 05/10/2024 1125   GFRNONAA 87 07/15/2020 0924     Diabetic Labs (most recent): Lab Results  Component Value Date   HGBA1C 5.7 (H) 08/28/2014   HGBA1C 5.5 07/09/2013   HGBA1C 5.3 07/05/2012     Lipid Panel ( most recent) Lipid Panel     Component Value Date/Time   CHOL 219 (H) 05/10/2024 1125   TRIG 90 05/10/2024 1125   HDL 92 05/10/2024 1125   CHOLHDL 2.4 05/10/2024 1125   CHOLHDL 2.6 07/15/2020 0924   VLDL 13 08/28/2014 1450   LDLCALC 111 (H) 05/10/2024 1125   LDLCALC 85 07/15/2020 0924   LABVLDL 16 05/10/2024 1125      Lab Results  Component Value Date   TSH 0.440 (L) 05/10/2024   TSH 0.593 01/31/2023   TSH 0.428 (L) 11/05/2021   TSH 0.430 (L) 11/05/2021   TSH 0.93 07/15/2020   FREET4 1.33 11/05/2021   FREET4 1.1 11/23/2016   FREET4 1.18 08/28/2014        Thyroid  ultrasound on May 21, 2024  FINDINGS: Parenchymal Echotexture: Mildly heterogenous   Isthmus: 1.0 cm, previously 0.7 cm   Right lobe: 6.5 x 3.1 x 3.1 cm, previously 5.5 x 2.3 x 2.4 cm   Left lobe: 5.9 x 2.6 x 2.5 cm, previously 5.2 x 2.1 x 2.3 cm  IMPRESSION: 1. Thyroid  tissue is mildly heterogeneous and similar to previous examination. 2. Small thyroid  nodules that do not meet criteria for biopsy or dedicated follow-up.   The above is in keeping with the ACR TI-RADS recommendations - J Am Coll Radiol 2017;14:587-595.     Assessment & Plan:  1. Multinodular goiter (Primary)  - Carolann H Hellickson  is being seen at a kind request of Antonetta Rollene BRAVO,  MD. - I have reviewed her available thyroid  records and clinically evaluated the patient. - Based on these reviews, she has multinodular goiter.  The bilateral subcentimeter thyroid  nodules are documented to be stable and would not need any biopsy or further follow-up.  She will be considered for repeat ultrasound only if she develops significant compressive symptoms. She continued to have slightly suppressed TSH without clinical evidence of hyper or hypothyroidism.  She will however need a full set of thyroid  function test including TSH, free T4/total T3 as well as antithyroid antibodies.  She will return in 3 months with her lab results for treatment decisions if necessary.  - I did not initiate any new prescriptions today. - she is advised to maintain close follow up with Antonetta Rollene BRAVO, MD for primary care needs.   -Thank you for involving me in the care of this pleasant patient.  Time spent with the patient: 45  minutes spent in  counseling her about multinodular goiter, suppressed TSH and the rest in obtaining information about her symptoms, reviewing her previous labs/studies (including abstractions from other facilities),  evaluations, and treatments,  and developing a plan to confirm diagnosis and long term treatment based on the latest standards of care/guidelines; and documenting her care.  [name] VEAR Moishe participated in the discussions, expressed understanding, and voiced agreement with the above plans.  All questions were answered to her satisfaction. she is encouraged to contact clinic should she have any questions or concerns prior to her return visit.  Follow up plan: Return in about 3 months (around 11/05/2024) for F/U with Pre-visit Labs.   Ranny Earl, MD [facility] [address] [place], [place] [address] Phone: [phone]  Fax: [phone]     08/05/2024, 12:40 PM  This note was partially dictated with voice  recognition software. Similar sounding words can be transcribed inadequately or may not  be corrected upon review.

## 2024-08-12 ENCOUNTER — Encounter: Payer: Self-pay | Admitting: Bariatrics

## 2024-08-12 ENCOUNTER — Ambulatory Visit: Admitting: Bariatrics

## 2024-08-12 VITALS — BP 124/85 | HR 63 | Temp 97.8°F | Ht <= 58 in | Wt 189.0 lb

## 2024-08-12 DIAGNOSIS — E785 Hyperlipidemia, unspecified: Secondary | ICD-10-CM | POA: Diagnosis not present

## 2024-08-12 DIAGNOSIS — E66812 Obesity, class 2: Secondary | ICD-10-CM

## 2024-08-12 DIAGNOSIS — E669 Obesity, unspecified: Secondary | ICD-10-CM

## 2024-08-12 DIAGNOSIS — R632 Polyphagia: Secondary | ICD-10-CM | POA: Diagnosis not present

## 2024-08-12 DIAGNOSIS — Z6839 Body mass index (BMI) 39.0-39.9, adult: Secondary | ICD-10-CM

## 2024-08-12 MED ORDER — CONTRAVE 8-90 MG PO TB12: ORAL_TABLET | ORAL | 0 refills | Status: AC

## 2024-08-12 NOTE — Progress Notes (Unsigned)
 WEIGHT SUMMARY AND BIOMETRICS  Weight Lost Since Last Visit: 1lb  Weight Gained Since Last Visit: 0lb   Vitals Temp: 97.8 F (36.6 C) BP: 124/85 Pulse Rate: 63 SpO2: 99 %   Anthropometric Measurements Height: 4' 10 (1.473 m) Weight: 189 lb (85.7 kg) BMI (Calculated): 39.51 Weight at Last Visit: 190lb Weight Lost Since Last Visit: 1lb Weight Gained Since Last Visit: 0lb Starting Weight: 194lb Total Weight Loss (lbs): 5 lb (2.268 kg)   Body Composition  Body Fat %: 45.5 % Fat Mass (lbs): 86 lbs Muscle Mass (lbs): 98 lbs Total Body Water (lbs): 72.8 lbs Visceral Fat Rating : 14   Other Clinical Data Fasting: No Labs: No Today's Visit #: 5 Starting Date: 05/16/24    OBESITY [name] [name] is here to discuss her progress with her obesity treatment plan along with follow-up of her obesity related diagnoses.    Nutrition Plan: the Category 1 plan - 70% adherence.  Current exercise: line dancing  Interim History:  She is down 1 lb down since her last visit.  Eating all of the food on the plan., Protein intake is less than prescribed., and Water intake is adequate.   Pharmacotherapy: [name] [name] is on Contrave  1 in the am and 1 in the pm. Her cravings have improved.  Adverse side effects: None Hunger is moderately controlled.  Cravings are moderately controlled.  Assessment/Plan:   Polyphagia [name] [name] endorses excessive hunger.  Medication(s): Contrave  Effects of medication (appetite):  moderately controlled. Cravings are moderately controlled.   Plan: Medication(s): Contrave  2 in the am and 1 in the pm and will taper to the final dose.  Will increase water, protein and fiber to help assuage hunger.  Will minimize foods that have a high glucose index/load to minimize reactive hypoglycemia.   Dyslipidemia:   History of dyslipidemia. No medications.   Plan:   Will continue the plan.  Will focus on exercise both cardio and resistance.    Generalized Obesity: Current BMI BMI (Calculated): 39.51   Pharmacotherapy Plan: Contrave : 2 in the am and 1 in the pm and will taper to 2 in the am and 2 in the pm.    [name] [name] is currently in the action stage of change. As such, her goal is to continue with weight loss efforts.  She has agreed to the Category 1 plan.  Exercise goals: All adults should avoid inactivity. Some physical activity is better than none, and adults who participate in any amount of physical activity gain some health benefits.   Behavioral modification strategies: increasing lean protein intake, no meal skipping, meal planning , increase water intake, better snacking choices, increasing vegetables, increasing fiber rich foods, avoiding temptations, keep healthy foods in the home, weigh protein portions, measure portion sizes, work on smaller portions, and pack lunch for work.  [name] [name] has agreed to follow-up with our clinic in 4 weeks.  Objective:   VITALS: Per patient if applicable, see vitals. GENERAL: Alert and in no acute distress. CARDIOPULMONARY: No increased WOB. Speaking in clear sentences.  PSYCH: Pleasant and cooperative. Speech normal rate and rhythm. Affect is appropriate. Insight and judgement are appropriate. Attention is focused, linear, and appropriate.  NEURO: Oriented as arrived to appointment on time with no prompting.   Attestation Statements:   This was prepared with the assistance of Engineer, Civil (consulting).  Occasional wrong-word or sound-a-like substitutions may have occurred due to the inherent limitations of voice recognition.  Clayborne Daring, DO

## 2024-09-09 ENCOUNTER — Ambulatory Visit: Admitting: Bariatrics

## 2024-10-28 ENCOUNTER — Ambulatory Visit (HOSPITAL_COMMUNITY)

## 2024-11-07 ENCOUNTER — Ambulatory Visit: Admitting: "Endocrinology

## 2024-12-31 ENCOUNTER — Ambulatory Visit: Admitting: Family Medicine
